# Patient Record
Sex: Male | Born: 1935 | Race: White | Hispanic: No | Marital: Married | Smoking: Former smoker
Health system: Southern US, Community
[De-identification: ages and names within clinical notes are randomized; demographics above are authoritative.]

## PROBLEM LIST (undated history)

## (undated) DIAGNOSIS — E785 Hyperlipidemia, unspecified: Secondary | ICD-10-CM

## (undated) DIAGNOSIS — I10 Essential (primary) hypertension: Secondary | ICD-10-CM

## (undated) DIAGNOSIS — J449 Chronic obstructive pulmonary disease, unspecified: Secondary | ICD-10-CM

## (undated) DIAGNOSIS — I739 Peripheral vascular disease, unspecified: Secondary | ICD-10-CM

## (undated) DIAGNOSIS — D649 Anemia, unspecified: Secondary | ICD-10-CM

## (undated) HISTORY — PX: PR VEIN BYPASS GRAFT,AORTO-FEM-POP: 35551

## (undated) HISTORY — DX: Peripheral vascular disease, unspecified: I73.9

## (undated) HISTORY — DX: Anemia, unspecified: D64.9

## (undated) HISTORY — DX: Hyperlipidemia, unspecified: E78.5

## (undated) HISTORY — DX: Essential (primary) hypertension: I10

## (undated) HISTORY — DX: Chronic obstructive pulmonary disease, unspecified: J44.9

---

## 1998-02-13 ENCOUNTER — Inpatient Hospital Stay: Admission: RE | Admit: 1998-02-13 | Discharge: 1998-02-18 | Payer: Self-pay | Admitting: Vascular Surgery

## 2006-12-23 ENCOUNTER — Ambulatory Visit: Payer: Self-pay | Admitting: Vascular Surgery

## 2007-07-10 ENCOUNTER — Ambulatory Visit: Payer: Self-pay | Admitting: Vascular Surgery

## 2008-07-05 ENCOUNTER — Ambulatory Visit: Payer: Self-pay | Admitting: Vascular Surgery

## 2008-07-16 ENCOUNTER — Ambulatory Visit: Payer: Self-pay | Admitting: Vascular Surgery

## 2008-07-23 ENCOUNTER — Ambulatory Visit: Payer: Self-pay | Admitting: Surgery

## 2008-07-23 ENCOUNTER — Ambulatory Visit (HOSPITAL_COMMUNITY): Admission: RE | Admit: 2008-07-23 | Discharge: 2008-07-23 | Payer: Self-pay | Admitting: Surgery

## 2008-07-29 ENCOUNTER — Ambulatory Visit: Payer: Self-pay | Admitting: Vascular Surgery

## 2008-08-27 ENCOUNTER — Ambulatory Visit: Payer: Self-pay | Admitting: Vascular Surgery

## 2008-10-29 ENCOUNTER — Ambulatory Visit: Payer: Self-pay | Admitting: Vascular Surgery

## 2009-01-28 ENCOUNTER — Ambulatory Visit: Payer: Self-pay | Admitting: Vascular Surgery

## 2009-06-24 ENCOUNTER — Ambulatory Visit: Payer: Self-pay | Admitting: Vascular Surgery

## 2009-12-16 ENCOUNTER — Ambulatory Visit: Payer: Self-pay | Admitting: Vascular Surgery

## 2010-07-22 ENCOUNTER — Ambulatory Visit: Payer: Self-pay | Admitting: Vascular Surgery

## 2011-01-05 NOTE — Procedures (Signed)
BYPASS GRAFT EVALUATION   INDICATION:  Follow up left lower extremity bypass graft.   HISTORY:  Diabetes:  Yes.  Cardiac:  No.  Hypertension:  Yes.  Smoking:  Quit.  Previous Surgery:  Left femoral-popliteal artery bypass graft,  11/22/1997 by Dr. Hart Rochester with PTA of the bypass graft on 07/23/2008 by  Dr. Myra Gianotti.   SINGLE LEVEL ARTERIAL EXAM                               RIGHT              LEFT  Brachial:                    167                165  Anterior tibial:             149                152  Posterior tibial:            165                150  Peroneal:  Ankle/brachial index:        0.99               0.91   PREVIOUS ABI:  Date: 06/24/2009  RIGHT:  0.88  LEFT:  0.73   LOWER EXTREMITY BYPASS GRAFT DUPLEX EXAM:   DUPLEX:  1. Doppler arterial waveforms appear biphasic proximal to, within, and      distal to the left lower extremity bypass graft.  No evidence of      graft stenosis.  2. Evidence of flow within the left superficial femoral artery      proximal to mid, then occluding distally with collaterals noted.  3. Multiple areas of elevated velocities within the SFA, including mid      thigh in general area of previously documented mid thigh      stenosis.  4. Elevated velocities of 508 cm/s in the distal tibioperoneal trunk      suggestive of >75% stenosis in the general area of the stenosis      documented 01/28/2009.   IMPRESSION:  1. Patent left femoral-popliteal artery bypass graft with no evidence      of graft stenosis.  2. Evidence of left distal tibioperoneal trunk stenosis >75% stenosis.  3. Right ankle brachial index shows slight increase.  Left ankle      brachial index shows significant increase from previous study.  4. Bilateral waveforms at ankle are all biphasic.   ___________________________________________  Quita Skye. Hart Rochester, M.D.   AS/MEDQ  D:  12/16/2009  T:  12/16/2009  Job:  [id number]

## 2011-01-05 NOTE — Procedures (Signed)
BYPASS GRAFT EVALUATION   INDICATION:  Follow-up left lower extremity bypass graft, lower  extremity pain has increased over the last month.   HISTORY:  Diabetes:  Yes.  Cardiac:  No.  Hypertension:  Yes.  Smoking:  No.  Previous Surgery:  Left femoral-popliteal artery bypass graft with  saphenous vein and left external iliac artery and common femoral artery  DPA on 11/22/1997.  Removal of occluded left femoral-popliteal artery  Gore-Tex bypass graft on 02/13/1998.   SINGLE LEVEL ARTERIAL EXAM                               RIGHT              LEFT  Brachial:                    150                165  Anterior tibial:             135                91  Posterior tibial:            148                106  Peroneal:  Ankle/brachial index:        0.90               0.64   PREVIOUS ABI:  Date: 07/10/2007  RIGHT:  0.97  LEFT:  0.91   LOWER EXTREMITY BYPASS GRAFT DUPLEX EXAM:   DUPLEX:  1. Doppler arterial waveforms appear biphasic proximal to the graft      dropping to low flow monophasic by distal graft.  2. Elevated velocities of 295 cm/s in the native distal to the graft      suggestive of >75% stenosis.   IMPRESSION:  1. Right ABI appears stable.  2. Left ABI shows significant decrease.  3. Left femoral-popliteal artery bypass graft appears patent with      elevated velocities in the native distal to it, suggestive of >75%      stenosis.  4. Spoke with Dr. Arbie Cookey and follow-up with Dr. Hart Morris was scheduled.   ___________________________________________  Trevor Morris, M.D.   AS/MEDQ  D:  07/05/2008  T:  07/05/2008  Job:  [id number]

## 2011-01-05 NOTE — Procedures (Signed)
BYPASS GRAFT EVALUATION   INDICATION:  Follow up of a left femoral-to-popliteal bypass graft.   HISTORY:  Diabetes:  No  Cardiac:  No.  Hypertension:  No.  Smoking:  Yes.  Previous Surgery:  Previous left femoral-to-popliteal bypass graft in  December, 2009.   SINGLE LEVEL ARTERIAL EXAM                               RIGHT              LEFT  Brachial:                    157                166  Anterior tibial:             136                117  Posterior tibial:            146                121  Peroneal:  Ankle/brachial index:        0.88               0.73   PREVIOUS ABI:  Date: 01/28/09  RIGHT:  0.78  LEFT:  0.58   LOWER EXTREMITY BYPASS GRAFT DUPLEX EXAM:   DUPLEX:  Triphasic Doppler waveforms proximal to and at proximal bypass  graft.  Monophasic waveforms in the mid thigh bypass graft and returns  to biphasic in the distal bypass graft and distal arteries.   IMPRESSION:  1. A 50-75% stenosis identified in the mid thigh bypass graft.  2. Right ankle brachial index suggests mild disease.  3. Left ankle brachial index suggests moderate disease.  4. Has shown improvement bilaterally compared with previous studies.   ___________________________________________  Quita Skye Hart Rochester, M.D.   CJ/MEDQ  D:  06/24/2009  T:  06/24/2009  Job:  [id number]

## 2011-01-05 NOTE — Op Note (Signed)
NAME:  ROCKNEY, GRENZ            ACCOUNT NO.:  [id number]   MEDICAL RECORD NO.:  [id number]          PATIENT TYPE:  AMB   LOCATION:  SDS                          FACILITY:  [facility]   PHYSICIAN:  VDurene Cal IV, MDDATE OF BIRTH:  May 12, 1934   DATE OF PROCEDURE:  07/23/2008  DATE OF DISCHARGE:                               OPERATIVE REPORT   PREOPERATIVE DIAGNOSIS:  Left femoral-popliteal bypass graft stenosis.   POSTOPERATIVE DIAGNOSIS:  Left femoral-popliteal bypass graft stenosis.   PROCEDURE PERFORMED:  1. Ultrasound-guided access, right common femoral artery.  2. Abdominal aortogram.  3. Left lower extremity runoff.  4. Third-order catheterization.  5. Additional-order catheterization.  6. Percutaneous transluminal angioplasty, left femoral-popliteal      bypass graft.  7. Percutaneous transluminal angioplasty, left tibioperoneal trunk.  8. Followup angiogram x2.  9. Retrograde right femoral angiogram.  10.Closure device.   INDICATIONS:  This is a 77 year old gentleman who has previously  undergone femoral-popliteal bypass graft with vein.  He has had a PTFE  graft removed.  He is asymptomatic but has had a decrease in his ankle-  brachial index 0.9-0.6, identified was a stenosis at the distal  anastomosis in the native distal artery.  He comes in today for  arteriogram.  Risks and benefits were discussed.  Informed consent was  signed.   PROCEDURE:  The patient was identified in the holding area and taken to  room 8 and he was placed supine on the table.  Bilateral groins were  prepped and draped in a standard sterile fashion.  A time-out was  called.  The right femoral artery was evaluated with ultrasound, found  to be calcified, but patent.  A 1% lidocaine was used for local  anesthesia.  The right femoral artery was accessed under ultrasound  guidance with an 18-gauge needle.  An 035 wire was advanced in  retrograde fashion into the abdominal aorta under  fluoroscopic  visualization.  A 5-French sheath was placed.  Over the wire, an Omni  flush catheter was placed at the level of L1 and abdominal aortogram was  obtained.  Next, using a Bentson wire and Omni flush catheter, a  bifurcation was crossed.  Catheter was placed in the left external iliac  artery and left lower extremity runoff was obtained.   FINDINGS:  Aortogram:  Visualized portions of the suprarenal abdominal  aorta show minimal disease.  The left renal artery is visualized and  found to be patent.  Right renal artery is not well visualized.  There  is moderate diffuse disease throughout the infrarenal abdominal aorta.  There is focal narrowing of the proximal right common iliac artery,  however, this does not appear to be hemodynamically significant.  The  right external iliac artery is widely patent and the right hypogastric  artery is widely patent.  There is luminal narrowing and a focal ulcer  at the aortic bifurcation.  The common iliac and external iliac artery  on the left are widely patent.  The left hypogastric artery is widely  patent.   Left lower extremity:  The left common femoral artery is patent.  The  left profunda femoral artery is patent.  The left superficial femoral  artery is occluded.  There is sluggish flow through the femoral-  popliteal bypass graft.  There is an area of high-grade stenosis in the  distal popliteal artery.  This is at the level of the anastomosis.  The  patient has 2-vessel runoff via the posterior tibial and peroneal  artery.   INTERVENTION:  At this point in time the decision was made to intervene.  Over a Rosen wire, the 5-French sheath was exchanged out for 45 cm 7-  [place] Terumo sheath.  The patient was then given systemic  heparinization.  A Cougar wire and a KMP catheter was used to select the  bypass graft with a Cougar wire.  The wire was then advanced down to the  distal anastomosis.  A 2 x 2 Fox SV balloon was then  advanced over the  wire down to the level of the luminal narrowing.  The Cougar wire was  then manipulated and advanced into the peroneal artery.  The 2 x 2 Fox  SV balloon was then placed across the area of stenosis at the distal  anastomosis and into the native tibioperoneal trunk.  It was taken to  nominal pressure which was 12 and held up for a minute.  Followup  arteriogram revealed suboptimal result with significant stenosis across  the distal anastomosis and native tibioperoneal artery.  I then selected  a 3 x 4 balloon and performed balloon angioplasty of the bypass graft in  the tibioperoneal trunk.  Balloon was taken to nominal pressure and held  up for 2 minutes.  Followup arteriogram revealed suboptimal result.  I  then selected a 4 mm x 40 Fox SV balloon.  This was taken to 6  atmospheres, held up to a nominal pressure.  Followup angiogram revealed  dissection at the distal anastomosis.  Therefore, the balloon was taken  to profile and held up for 3 minutes.  Multiple views were obtained.  I  did not feel the dissection was flow limiting.  There was an area of  approximately 30% stenosis in the native tibioperoneal trunk, which did  not respond to balloon angioplasty.  I did not feel that aggressively  dilating this area would be beneficial and therefore elected to  terminate the procedure at this point.  Catheters and wires were  removed.  Sheath was pulled back into the right external iliac artery.  At retrograde, right femoral angiogram was obtained to evaluate closure  device, found to be adequate.  StarClose was successfully deployed.   IMPRESSION:  1. Diffusely diseased infrarenal abdominal aorta.  2. High-grade stenosis at the distal left femoral-popliteal bypass      graft, successfully treated with 4-mm balloon.  Small residual      dissection that did not appear to be flow limiting.  There is also      an area of stenosis that did not respond to balloon  angioplasty,      however, overall flow through the bypass graft is significantly      improved.  Decision was made not to proceed with more aggressive      treatment.           ______________________________  V. Charlena Cross, MD  Electronically Signed     VWB/MEDQ  D:  07/23/2008  T:  07/24/2008  Job:  [id number]

## 2011-01-05 NOTE — Procedures (Signed)
BYPASS GRAFT EVALUATION   INDICATION:  Follow-up evaluation of lower extremity bypass graft.   HISTORY:  Diabetes:  No.  Cardiac:  No.  Hypertension:  Yes.  Smoking:  Quit in 1989.  Previous Surgery:  PTA of left fem-pop bypass graft on 07/23/08 by Dr.  Myra Gianotti.   SINGLE LEVEL ARTERIAL EXAM                               RIGHT              LEFT  Brachial:                    152                158  Anterior tibial:             123                88  Posterior tibial:            119                91  Peroneal:  Ankle/brachial index:        0.78               0.58   PREVIOUS ABI:  Date:  10/29/08  RIGHT:  0.81  LEFT:  0.77   LOWER EXTREMITY BYPASS GRAFT DUPLEX EXAM:   DUPLEX:  1. Increased velocity of 636 cm/s noted at the left distal fem-pop      bypass graft anastomosis.  2. Biphasic duplex waveforms noted within the graft and native artery.   IMPRESSION:  1. Patent femoropopliteal bypass graft with no evidence of focal      stenosis.  2. Right lower extremity ankle brachial index suggests moderate      arterial disease with biphasic Doppler waveform.  3. Left lower extremity ankle brachial index suggests moderate-to-      severe arterial disease with biphasic Doppler waveform.   ___________________________________________  Trevor Morris, M.D.   AC/MEDQ  D:  01/28/2009  T:  01/28/2009  Job:  [id number]

## 2011-01-05 NOTE — Assessment & Plan Note (Signed)
OFFICE VISIT   DONJUAN, ROBISON  DOB:  1933-11-08                                       08/27/2008  ZOXWR#:[id number]   Mr. Corales had primary angioplasty of the left femoral popliteal  bypass graft distally in the tibial peroneal trunk done by Dr. Myra Gianotti  on December 1st for failing left femoral popliteal graft.  His ABI has  improved from 64% to 90%.  The right leg is stable with an ABI of 0.92.  He has no claudication symptoms at the present time.   PHYSICAL EXAMINATION:  Blood pressure 197/81, heart rate 81,  respirations 14.  He has 3+ femoral popliteal pulses bilaterally with 2+  posterior tibial pulses palpable in both legs.   I discussed with him continuing his aspirin and treatment daily as well  as remaining active and continuing surveillance of the lower extremity  graft which is at high risk of developing recurrent stenosis in the  future.  He will return in 3 months for his next protocol visit or  sooner if he should have any change in his symptomatology.   Quita Skye Hart Rochester, M.D.  Electronically Signed   JDL/MEDQ  D:  08/27/2008  T:  08/28/2008  Job:  [id number]

## 2011-01-05 NOTE — Consult Note (Signed)
NEW PATIENT CONSULTATION   Trevor Morris, Trevor Morris  DOB:  07/08/1934                                       07/16/2008  ZOXWR#:[id number]   The patient is an old patient of mine having undergone a left femoral-  popliteal bypass grafting in 1999 with vein graft and eventually had a  nonfunctional femoral popliteal Gore-Tex graft removed from the left leg  because it was infected.  He has done well over the last 10 years,  widely patent vein graft of the left leg and no symptoms of  claudication.  He has been quite active and has no history of coronary  artery disease, chest pain, dyspnea on exertion, PND or orthopnea.  He  also denies any TIAs, amaurosis fugax, diplopia, blurred vision or  syncope.  Lately he has been able to walk up to a mile without severe  symptoms but there has been some abnormality noted on his duplex scan of  his left femoral popliteal graft with a narrowing distal to the vein  graft and a decrease in the ABI from 0.91 to 0.64.  He takes one aspirin  a day.   PHYSICAL EXAM:  Blood pressure 154/77, heart rate 73, respirations 16.  His carotid pulses are 3+ with no audible bruits.  Chest:  Clear to  auscultation.  Cardiovascular:  Regular rhythm with no murmurs.  Abdomen:  Soft, nontender with no masses.  The right leg has a 2+  femoral, 3+ popliteal and 3+ posterior tibial pulse.  Left leg has a 3+  femoral, 3+ popliteal and no distal pulses.  Both feet are adequately  perfused.   ABI in the left leg was 0.64 and in the right leg 0.90.   He does have evidence of a severe stenosis just distal to the vein graft  either in the popliteal or tibial peroneal trunk on the left.  We will  schedule him for angiography by Dr. Myra Gianotti on December 1 via the right  common femoral approach and see what the findings are.  He may need a  surgical revision of this artery or if quite focal possibly an  angioplasty or stenting depending on the location.  I  discussed this  with the patient and he is agreeable.   Quita Skye Hart Rochester, M.D.  Electronically Signed   JDL/MEDQ  D:  07/16/2008  T:  07/17/2008  Job:  [id number]

## 2011-01-05 NOTE — Assessment & Plan Note (Signed)
OFFICE VISIT   CASYN, BECVAR  DOB:  07/05/1934                                       01/28/2009  ZOXWR#:[id number]   The patient had a left femoral popliteal vein graft in 1999 using vein  from the right leg.  He had had three failed vein grafts by Dr. Sheron Nightingale performed in 1989 using vein from the left leg.  Dr. Myra Gianotti  performed an angiogram and an intervention of the left tibial peroneal  trunk where there was a severe stenosis just distal to the functioning  vein graft which really looked fairly good.  There was two vessel runoff  of the posterior tibial and peroneal artery.  This was done in December  of 2009.  ABI improved to about 90% and has now decreased in the last  two visits to currently 77%.  Scan of the graft today reveals the graft  to be widely patent but an area of high velocity just distal to the vein  graft 635 cm/sec.   On examination he has a 3+ femoral and popliteal graft pulse as well as  a 2+ posterior tibial pulse in the left ankle with a well-perfused left  foot.  He says he is having no claudication symptoms in the left leg.  The right leg continues to have an ABI of 0.78 with triphasic flow in  the right foot.   I am concerned obviously that the area of angioplasty in the tibial  peroneal trunk is restenosing although he does have an excellent pulse  in the left foot and no symptoms.  We will repeat his ABI in 3 months as  well as scan of the graft.  If it continues to be significantly abnormal  will then repeat the angiogram.  If he needs an extension of the vein  graft to a more distal site it appears the peroneal may be the best  vessel and a short piece of cephalic vein graft from the right arm  should allow [place] to do that.   Quita Skye Hart Rochester, M.D.  Electronically Signed   JDL/MEDQ  D:  01/28/2009  T:  01/29/2009  Job:  [id number]

## 2011-01-05 NOTE — Procedures (Signed)
BYPASS GRAFT EVALUATION   INDICATION:  Follow up PTA of left fem-pop bypass graft.   HISTORY:  Diabetes:  No.  Cardiac:  No.  Hypertension:  Yes.  Smoking:  No.  Previous Surgery:  Please see above.   SINGLE LEVEL ARTERIAL EXAM                               RIGHT              LEFT  Brachial:                    167                162  Anterior tibial:             136                129  Posterior tibial:            136                126  Peroneal:  Ankle/brachial index:        0.81               0.77   PREVIOUS ABI:  Date: 08/27/08  RIGHT:  0.92  LEFT:  0.90   LOWER EXTREMITY BYPASS GRAFT DUPLEX EXAM:   DUPLEX:  Patent left fem-pop bypass graft with no evidence of focal  stenosis.   IMPRESSION:  1. Patent left femoropopliteal bypass graft with no evidence of focal      stenosis.  2. Mildly abnormal ankle brachial index with biphasic Doppler waveform      noted in bilateral legs, status post percutaneous transluminal      angioplasty of left femoropopliteal bypass graft.   ___________________________________________  Quita Skye. Hart Rochester, M.D.   MG/MEDQ  D:  10/29/2008  T:  10/29/2008  Job:  [id number]

## 2011-01-05 NOTE — Assessment & Plan Note (Signed)
OFFICE VISIT   MONTAE, STAGER  DOB:  12/08/1933                                       07/29/2008  WUJWJ#:[id number]   The patient underwent angioplasty of his left distal popliteal and  tibioperoneal trunk distal to the left femoral popliteal bypass graft.  This was performed by Dr. Myra Gianotti 6 days ago.  He developed an allergic  rash-type reaction to the contrast material, which began a day or 2  later.  This involved his chest, abdomen, both inguinal areas, both  thighs, and the calf areas bilaterally.  He initially tried Benadryl and  later was started on a steroid dose pack, which is about complete.  He  has also been trying aloe vera lotion.  I suggested that he try some  Cortaid ointment as well, and to continue p.r.n. Benadryl as well as the  steroids.   EXAM:  Blood pressure is 155/71, heart rate is 84, respirations 14,  temperature 98.  Chest is free of any urticarial rash today.  Abdomen is  soft and nontender with no masses.  Right leg has a 2+ femoral pulse,  palpable popliteal, and 1 to 2+ posterior tibial pulse.  The left leg  has 3+ femoral, popliteal, and posterior tibial pulses, well-perfused  left leg.  Most of the remaining rash is in the lower abdomen, inguinal  area, and proximal thighs.  Appears to be resolving.   He was reassured regarding these findings.  Will return in 1 month for  his followup appointment, at which time ABI will be obtained, unless he  has problems in the interim.   Quita Skye Hart Rochester, M.D.  Electronically Signed   JDL/MEDQ  D:  07/29/2008  T:  07/30/2008  Job:  [id number]

## 2011-01-05 NOTE — Procedures (Signed)
BYPASS GRAFT EVALUATION   INDICATION:  Followup lower extremity bypass graft.   HISTORY:  Diabetes:  Yes.  Cardiac:  No.  Hypertension:  Yes.  Smoking:  No.  Previous Surgery:  Left fem-pop bypass graft with saphenous vein and  left EIA and CFA DPA on 11/22/1997.  Removal of occluded left fem-pop  Gore-Tex graft on 02/13/1998.   SINGLE LEVEL ARTERIAL EXAM                               RIGHT              LEFT  Brachial:                    136                136  Anterior tibial:             125                120  Posterior tibial:            132                124  Peroneal:  Ankle/brachial index:        0.97               0.91   PREVIOUS ABI:  Date: 12/23/2006  RIGHT:  0.91  LEFT:  0.92   LOWER EXTREMITY BYPASS GRAFT DUPLEX EXAM:  See attached sheet for  velocities.   DUPLEX:  Doppler arterial waveforms appear borderline,  biphasic/triphasic, proximal to, within, and distal to left femoral-  popliteal bypass graft.   IMPRESSION:  1. ABIs are stable from previous study bilaterally.  2. Patent left femoral-popliteal bypass graft.   ___________________________________________  Trevor Morris. Hart Rochester, M.D.   AS/MEDQ  D:  07/10/2007  T:  07/11/2007  Job:  [id number]

## 2011-01-05 NOTE — Procedures (Signed)
BYPASS GRAFT EVALUATION   INDICATION:  Follow up left fem-pop graft.   HISTORY:  Diabetes:  Yes.  Cardiac:  No.  Hypertension:  Yes.  Smoking:  Quit.  Previous Surgery:  Left fem-pop graft 11/22/1997, performed by Dr.  Hart Rochester; PTAF graft 07/23/2008 performed by Dr. Myra Gianotti.   SINGLE LEVEL ARTERIAL EXAM                               RIGHT              LEFT  Brachial:                    193                192  Anterior tibial:             153                148  Posterior tibial:            173                152  Peroneal:  Ankle/brachial index:        0.90               0.79   PREVIOUS ABI:  Date: 12/16/2009  RIGHT:  0.99  LEFT:  0.91   LOWER EXTREMITY BYPASS GRAFT DUPLEX EXAM:   DUPLEX:  1. Patent graft with elevated velocities near the area of the distal      anastomosis/PT trunk.  2. The graft has been tunneled deep to the native vessels.  3. Interrogation of the popliteal area is difficult due to multiple      interventions.  4. Biphasic and triphasic waveforms throughout the graft.   IMPRESSION:  1. Patent left femoral-popliteal graft with elevated velocities near      the distal anastomosis/__________trunk area, which have been      previously documented.  2. Mild right ankle brachial index, moderate left ankle brachial      index.  3. No significant changes from previous study of 12/16/2009.   ___________________________________________  Quita Skye. Hart Rochester, M.D.   LT/MEDQ  D:  07/22/2010  T:  07/22/2010  Job:  [id number]

## 2011-01-05 NOTE — Assessment & Plan Note (Signed)
OFFICE VISIT   Trevor Morris, Trevor Morris  DOB:  1933/11/24                                       06/24/2009  ZOXWR#:[id number]   The patient returns for further followup regarding his lower extremity  left femoral-popliteal bypass graft.  He reports no claudication  symptoms in either leg and says that he is doing well.  He denies any  chest pain, dyspnea on exertion, PND or orthopnea.   I ordered a duplex scan of his left femoral-popliteal graft today as  well as ABIs and I have reviewed and interpreted these.   PHYSICAL EXAMINATION:  Vital signs:  On examination today his blood  pressure is 194/74, heart rate 74, respirations 18.  Neck:  Carotid  pulses are 3+, no audible bruits.  Chest:  Clear to auscultation.  Cardiovascular:  Regular rhythm.  No murmurs.  Left leg reveals 3+  femoral, popliteal and 2+ posterior tibial pulses palpable.  Right leg  has a 3+ femoral, 2+ popliteal and 3+ posterior tibial pulse palpable.  ABI is 0.88 on the right and 0.73 on the left.   There is no evidence of the severe stenosis in the tibial peroneal trunk  thought to be present at the last visit 3 months ago with a high  velocity (636) in that area.  Today the velocity is normal.  There is  some mild elevation in the mid portion of the femoral-popliteal graft  but no severe stenosis.  ABI is also improved.   We will continue to follow him on the femoral-popliteal protocol on a  regular basis unless he develops any new symptoms.   Quita Skye Hart Rochester, M.D.  Electronically Signed   JDL/MEDQ  D:  06/24/2009  T:  06/25/2009  Job:  [id number]

## 2011-05-28 LAB — POCT I-STAT, CHEM 8
BUN: 24 mg/dL — ABNORMAL HIGH (ref 6–23)
Calcium, Ion: 1.21 mmol/L (ref 1.12–1.32)
Chloride: 103 mEq/L (ref 96–112)
Creatinine, Ser: 1.1 mg/dL (ref 0.4–1.5)
Glucose, Bld: 271 mg/dL — ABNORMAL HIGH (ref 70–99)
TCO2: 23 mmol/L (ref 0–100)

## 2011-05-28 LAB — GLUCOSE, CAPILLARY: Glucose-Capillary: 79 mg/dL (ref 70–99)

## 2011-06-04 ENCOUNTER — Encounter: Payer: Self-pay | Admitting: Vascular Surgery

## 2011-07-23 ENCOUNTER — Ambulatory Visit: Payer: Self-pay

## 2011-09-10 ENCOUNTER — Ambulatory Visit: Payer: Self-pay

## 2011-09-10 ENCOUNTER — Other Ambulatory Visit: Payer: Self-pay

## 2011-09-14 ENCOUNTER — Encounter: Payer: Self-pay | Admitting: Thoracic Diseases

## 2011-09-15 ENCOUNTER — Other Ambulatory Visit (INDEPENDENT_AMBULATORY_CARE_PROVIDER_SITE_OTHER): Payer: Medicare Other | Admitting: *Deleted

## 2011-09-15 ENCOUNTER — Ambulatory Visit (INDEPENDENT_AMBULATORY_CARE_PROVIDER_SITE_OTHER): Payer: Medicare Other | Admitting: *Deleted

## 2011-09-15 ENCOUNTER — Ambulatory Visit (INDEPENDENT_AMBULATORY_CARE_PROVIDER_SITE_OTHER): Payer: Medicare Other | Admitting: Thoracic Diseases

## 2011-09-15 VITALS — BP 146/63 | HR 80

## 2011-09-15 DIAGNOSIS — Z48812 Encounter for surgical aftercare following surgery on the circulatory system: Secondary | ICD-10-CM

## 2011-09-15 DIAGNOSIS — I739 Peripheral vascular disease, unspecified: Secondary | ICD-10-CM

## 2011-09-15 DIAGNOSIS — I70229 Atherosclerosis of native arteries of extremities with rest pain, unspecified extremity: Secondary | ICD-10-CM

## 2011-09-15 NOTE — Progress Notes (Signed)
[facility] [place]  Left Fem -Pop bypass evaluation Surgeon: Vivi Martens, MD  History of Present Illness  Trevor Morris is a 78 y.o. male patient who is here for ABI's. Pt. denies claudication Pt. denies rest pain; denies night pain denies non healing ulcers on BLE .  Pt has had previous intervention of Left Vascular Bypass .  Past Medical History  Diagnosis Date  . Diabetes mellitus   . Hyperlipidemia   . Hypertension   . Anemia     Social History History  Substance Use Topics  . Smoking status: Former Smoker    Types: Cigarettes    Quit date: 08/24/1987  . Smokeless tobacco: Not on file  . Alcohol Use: No    Family History Family History  Problem Relation Age of Onset  . Diabetes Other     ROS: [x]  Positive   [ ]  Negative   [ ]  All sytems reviewed and are negative  General:[ ]  Weight loss,  [ ]  Weight gain, [ ]  Fever, [ ]  chills Neurologic: [ ]  Dizziness, [ ]  Blackouts, [ ]  Seizure [ ]  Stroke, [ ]  "Mini stroke", [ ]  Slurred speech, [ ]  Temporary blindness;  [ ] weakness, [ ]  Hoarseness Cardiac: [ ]  Chest pain/pressure, [ ]  Shortness of breath at rest [ ]  Shortness of breath with exertion,  [ ]   Atrial fibrillation or irregular heartbeat Vascular:[ ]  Pain in legs with walking, [ ]  Pain in legs at rest ,[ ]  Pain in legs at night,  [ ]   Non-healing ulcer, [ ]  Blood clot in vein/DVT,   Pulmonary: [ ]  Home oxygen, [ ]   Productive cough, [ ]  Coughing up blood,  [ ]  Asthma,  [ ]  Wheezing Musculoskeletal:  [ ]  Arthritis, [ ]  Low back pain,  [ ]  Joint pain Hematologic:[ ]  Easy Bruising, [ ]  Anemia; [ ]  Hepatitis Gastrointestinal: [ ]  Blood in stool,  [ ]  Gastroesophageal Reflux, [ ]  Trouble swallowing Urinary: [ ]  chronic Kidney disease, [ ]  on HD, [ ]  Burning with urination, [ ]  Frequent urination, [ ]  Difficulty urinating;  Skin: [ ]  Rashes, [ ]  Wounds    Allergies  Allergen Reactions  . Omnipaque (Iohexol) Itching and Rash    Current  outpatient prescriptions:aspirin EC 81 MG tablet, Take 81 mg by mouth daily.  , Disp: , Rfl: ;  lisinopril (PRINIVIL,ZESTRIL) 40 MG tablet, Take 40 mg by mouth daily.  , Disp: , Rfl: ;  metFORMIN (GLUCOPHAGE) 500 MG tablet, Take 500 mg by mouth 2 (two) times daily with a meal.  , Disp: , Rfl: ;  rosuvastatin (CRESTOR) 40 MG tablet, Take 40 mg by mouth daily.  , Disp: , Rfl:   Physical Examination  Filed Vitals:   09/15/11 1456  BP: 146/63  Pulse: 80    There is no height or weight on file to calculate BMI.  General: A&O x 3, WDWN,  Gait: normal Eyes: PERRLA, Pulmonary: CTAB, without wheezes , rales or rhonchi Cardiac: regular Rythm  With occasional ectopic beat       Neurologic: A&O X 3; Appropriate Affect ; SENSATION ;normal; Musculoskeletal: normal 5/5 strength in all tested muscle groups  Skin of lower extremity Normal VASCULAR EXAM: Extremities without ischemic changes   BLE  LOWER EXTREMITY PULSES                                                                                                                               Right           Left      POSTERIOR TIBIAL palpable palpable       DORSALIS PEDIS      ANTERIOR TIBIAL absent absent    Non-Invasive Vascular Imaging: DATE: 09/15/2011 ABI: RIGHT 1.0;  LEFT 0.90 DUPLEX SCAN OF BYPASS: biphasic waveforms noted throughout LLE BPG with elevated velocity of > 300cm/s noted in L PT trunk, distal to the BPG  Previous angiogram: and PTA of left fem-pop bypass graft and left TP trunk 07/23/2008.    ASSESSMENT: Trevor Morris is a 78 y.o. male who presents with: left lower patent Fem-pop bypass and stable ABI's PLAN: Based on the patient's vascular studies and examination, pt will return to clinic in 1 years   Clinic MD: CS Edilia Bo, MD

## 2011-09-22 NOTE — Procedures (Unsigned)
BYPASS GRAFT EVALUATION  INDICATION:  Left lower extremity bypass graft.  HISTORY: Diabetes:  Yes. Cardiac:  No. Hypertension:  Yes. Smoking:  previous. Previous Surgery:  Left fem-pop bypass graft in 1999 with PTA of the bypass graft on 07/23/2008.  SINGLE LEVEL ARTERIAL EXAM                              RIGHT              LEFT Brachial: Anterior tibial: Posterior tibial: Peroneal: Ankle/brachial index:  PREVIOUS ABI:  Date:  RIGHT:  LEFT:  LOWER EXTREMITY BYPASS GRAFT DUPLEX EXAM:  DUPLEX:  Biphasic Doppler waveforms noted throughout the left lower extremity bypass graft with velocities of 232 cm/s and 302 cm/s noted in the distal anastomosis region and peroneal tibial trunk, respectively.  IMPRESSION: 1. Patent left femoropopliteal bypass graft with elevated velocities,     as described above.  No significant change in maximum velocity when     compared to the previous examination on 07/22/2010. 2. Bilateral ankle brachial indices are noted on a separate report.  ___________________________________________ Quita Skye. Hart Rochester, M.D.  CH/MEDQ  D:  09/16/2011  T:  09/16/2011  Job:  [id number]

## 2011-11-03 ENCOUNTER — Other Ambulatory Visit: Payer: Self-pay | Admitting: *Deleted

## 2011-11-03 DIAGNOSIS — I70229 Atherosclerosis of native arteries of extremities with rest pain, unspecified extremity: Secondary | ICD-10-CM

## 2011-11-03 DIAGNOSIS — Z48812 Encounter for surgical aftercare following surgery on the circulatory system: Secondary | ICD-10-CM

## 2012-09-25 ENCOUNTER — Ambulatory Visit: Payer: Medicare Other | Admitting: Neurosurgery

## 2012-10-03 ENCOUNTER — Ambulatory Visit: Payer: Medicare Other | Admitting: Vascular Surgery

## 2012-11-02 ENCOUNTER — Other Ambulatory Visit: Payer: Self-pay | Admitting: *Deleted

## 2012-11-02 DIAGNOSIS — Z48812 Encounter for surgical aftercare following surgery on the circulatory system: Secondary | ICD-10-CM

## 2012-11-02 DIAGNOSIS — I739 Peripheral vascular disease, unspecified: Secondary | ICD-10-CM

## 2012-11-13 ENCOUNTER — Encounter: Payer: Self-pay | Admitting: Vascular Surgery

## 2012-11-14 ENCOUNTER — Encounter (INDEPENDENT_AMBULATORY_CARE_PROVIDER_SITE_OTHER): Payer: Medicare Other | Admitting: *Deleted

## 2012-11-14 ENCOUNTER — Ambulatory Visit (INDEPENDENT_AMBULATORY_CARE_PROVIDER_SITE_OTHER): Payer: Medicare Other | Admitting: Vascular Surgery

## 2012-11-14 ENCOUNTER — Encounter: Payer: Self-pay | Admitting: Vascular Surgery

## 2012-11-14 VITALS — BP 206/111 | HR 89 | Resp 18 | Ht 64.0 in | Wt 140.0 lb

## 2012-11-14 DIAGNOSIS — Z48812 Encounter for surgical aftercare following surgery on the circulatory system: Secondary | ICD-10-CM

## 2012-11-14 DIAGNOSIS — I70229 Atherosclerosis of native arteries of extremities with rest pain, unspecified extremity: Secondary | ICD-10-CM

## 2012-11-14 DIAGNOSIS — I739 Peripheral vascular disease, unspecified: Secondary | ICD-10-CM

## 2012-11-14 HISTORY — DX: Peripheral vascular disease, unspecified: I73.9

## 2012-11-14 NOTE — Progress Notes (Signed)
Subjective:     Patient ID: Trevor Morris, male   DOB: [birth year], 79 y.o.   MRN: [id number]  HPI this 79 year old male returns for continued followup regarding his left femoral-popliteal bypass graft which is a saphenous vein graft from the right leg and was performed in 1999. He previously had some Gore-Tex graft placed in the left leg which were removed. He denies any claudication symptoms at this time. He spends the majority of his day caring for his wife who has had several strokes and requires 24-hour a day attention.  Past Medical History  Diagnosis Date  . Diabetes mellitus   . Hyperlipidemia   . Hypertension   . Anemia     History  Substance Use Topics  . Smoking status: Former Smoker    Types: Cigarettes    Quit date: 08/24/1987  . Smokeless tobacco: Never Used  . Alcohol Use: No    Family History  Problem Relation Age of Onset  . Diabetes Other   . Diabetes Father     Allergies  Allergen Reactions  . Omnipaque (Iohexol) Itching and Rash    Current outpatient prescriptions:aspirin EC 81 MG tablet, Take 81 mg by mouth daily.  , Disp: , Rfl: ;  lisinopril (PRINIVIL,ZESTRIL) 40 MG tablet, Take 40 mg by mouth daily.  , Disp: , Rfl: ;  metFORMIN (GLUCOPHAGE) 500 MG tablet, Take 500 mg by mouth 2 (two) times daily with a meal.  , Disp: , Rfl: ;  rosuvastatin (CRESTOR) 40 MG tablet, Take 40 mg by mouth daily.  , Disp: , Rfl:   BP 206/111  Pulse 89  Resp 18  Ht 5\' 4"  (1.626 m)  Wt 140 lb (63.504 kg)  BMI 24.02 kg/m2  Body mass index is 24.02 kg/(m^2).           Review of Systems Denies chest pain, dyspnea on exertion, PND, orthopnea, hemoptysis, lateralizing weakness. Did have sudden loss of hearing in his left ear several months ago the      Objective:   Physical Exam blood pressure 206/111 heart rate 89 respirations 18 Gen.-alert and oriented x3 in no apparent distress HEENT normal for age Lungs no rhonchi or wheezing Cardiovascular regular rhythm  no murmurs carotid pulses 3+ palpable no bruits audible Abdomen soft nontender no palpable masses Musculoskeletal free of  major deformities Skin clear -no rashes Neurologic normal Lower extremities 3+ femoral and Posterior tibial pulse on the left 2+ femoral and posterior tibial pulse on the right Both feet well perfused  Today I ordered a duplex scan of the left leg and bilateral ABIs. ABIs are 0.9 on the right and 0.88 on the left essentially unchanged from previous study 79 years ago. No significant stenosis the left femoral-popliteal graft is identified on duplex scanning.        Assessment:     Nicely functioning left femoral-popliteal saphenous vein graft placed in 1999 with no significant claudication     Plan:     Return in one year for duplex scan of bypass and bilateral ABIs unless symptoms change in the interim

## 2012-11-14 NOTE — Addendum Note (Signed)
Addended by: Dannielle Karvonen on: 11/14/2012 01:58 PM   Modules accepted: Orders

## 2013-11-19 ENCOUNTER — Encounter: Payer: Self-pay | Admitting: Vascular Surgery

## 2013-11-20 ENCOUNTER — Ambulatory Visit (HOSPITAL_COMMUNITY)
Admission: RE | Admit: 2013-11-20 | Discharge: 2013-11-20 | Disposition: A | Discharge status: Home / Self Care | Payer: Medicare Other | Source: Ambulatory Visit | Attending: Vascular Surgery | Admitting: Vascular Surgery

## 2013-11-20 ENCOUNTER — Encounter: Payer: Self-pay | Admitting: Vascular Surgery

## 2013-11-20 ENCOUNTER — Ambulatory Visit (INDEPENDENT_AMBULATORY_CARE_PROVIDER_SITE_OTHER): Payer: Medicare Other | Admitting: Vascular Surgery

## 2013-11-20 VITALS — BP 171/75 | HR 79 | Ht 64.0 in | Wt 139.0 lb

## 2013-11-20 DIAGNOSIS — Z48812 Encounter for surgical aftercare following surgery on the circulatory system: Secondary | ICD-10-CM | POA: Insufficient documentation

## 2013-11-20 DIAGNOSIS — I739 Peripheral vascular disease, unspecified: Secondary | ICD-10-CM

## 2013-11-20 NOTE — Progress Notes (Signed)
Subjective:     Patient ID: Trevor Morris, male   DOB: 07-29-1934, 80 y.o.   MRN: [id number]  HPI this 80 year old male returns for continued followup regarding his left femoral popliteal saphenous vein graft using vein from the right leg which I performed in 1999. He had angioplasty of the popliteal artery and tibioperoneal trunk t in 2009 by Dr. Myra Gianotti . Patient does not ambulate long distances. He spends most of his day caring for his wife who has had multiple strokes. He denies any rest pain or history of nonhealing ulcers or infection.  Past Medical History  Diagnosis Date  . Diabetes mellitus   . Hyperlipidemia   . Hypertension   . Anemia     History  Substance Use Topics  . Smoking status: Former Smoker    Types: Cigarettes    Quit date: 08/24/1987  . Smokeless tobacco: Never Used  . Alcohol Use: No    Family History  Problem Relation Age of Onset  . Diabetes Other   . Diabetes Father     Allergies  Allergen Reactions  . Omnipaque [Iohexol] Itching and Rash    Current outpatient prescriptions:aspirin EC 81 MG tablet, Take 81 mg by mouth daily.  , Disp: , Rfl: ;  lisinopril (PRINIVIL,ZESTRIL) 40 MG tablet, Take 40 mg by mouth daily.  , Disp: , Rfl: ;  metFORMIN (GLUCOPHAGE) 500 MG tablet, Take 500 mg by mouth 2 (two) times daily with a meal.  , Disp: , Rfl: ;  rosuvastatin (CRESTOR) 40 MG tablet, Take 40 mg by mouth daily.  , Disp: , Rfl:   BP 171/75  Pulse 79  Ht 5\' 4"  (1.626 m)  Wt 139 lb (63.05 kg)  BMI 23.85 kg/m2  SpO2 96%  Body mass index is 23.85 kg/(m^2).           Review of Systems denies chest pain, dyspnea on exertion, PND, orthopnea, hemoptysis, claudication. All systems negative complete review of systems     Objective:   Physical Exam BP 171/75  Pulse 79  Ht 5\' 4"  (1.626 m)  Wt 139 lb (63.05 kg)  BMI 23.85 kg/m2  SpO2 96%  Gen.-alert and oriented x3 in no apparent distress HEENT normal for age Lungs no rhonchi or  wheezing Cardiovascular regular rhythm no murmurs carotid pulses 3+ palpable no bruits audible Abdomen soft nontender no palpable masses Musculoskeletal free of  major deformities Skin clear -no rashes Neurologic normal Lower extremities 3+ femoral and popliteal pulse bilaterally. Distal pulses are difficult to palpate. The feet are well-perfused with no evidence of infection.  Today I ordered a duplex scan of the left femoral-popliteal graft and bilateral ABIs which are reviewed and interpreted. ABI on the right is 0.83 on the left is 0.75 down from 0.88 most recently in March of 2014. There is evidence of some increased velocity in the left femoral-popliteal graft at the distal anastomosis to the popliteal artery at 323 cm/s.        Assessment:     Nicely functioning left femoral-popliteal vein graft with possible stenosis at distal anastomosis or below knee popliteal artery on the left    Plan:     Return in 6 months with repeat ABIs in duplex scan left femoral-popliteal graft to look at the vein graft and popliteal artery for possible progressive stenosis. The patient should develop acute symptoms he will be in touch with [place]

## 2013-11-20 NOTE — Addendum Note (Signed)
Addended by: Sharee Pimple on: 11/20/2013 12:39 PM   Modules accepted: Orders

## 2014-03-21 ENCOUNTER — Inpatient Hospital Stay (HOSPITAL_COMMUNITY): Payer: Medicare Other

## 2014-03-21 ENCOUNTER — Encounter (HOSPITAL_COMMUNITY): Payer: Self-pay | Admitting: *Deleted

## 2014-03-21 ENCOUNTER — Encounter: Payer: Self-pay | Admitting: Vascular Surgery

## 2014-03-21 ENCOUNTER — Inpatient Hospital Stay (HOSPITAL_COMMUNITY)
Admission: AD | Admit: 2014-03-21 | Discharge: 2014-04-05 | DRG: 280 | Disposition: A | Discharge status: Hospice | Payer: Medicare Other | Source: Ambulatory Visit | Attending: Internal Medicine | Admitting: Internal Medicine

## 2014-03-21 ENCOUNTER — Ambulatory Visit (INDEPENDENT_AMBULATORY_CARE_PROVIDER_SITE_OTHER): Payer: Medicare Other | Admitting: Vascular Surgery

## 2014-03-21 ENCOUNTER — Ambulatory Visit (INDEPENDENT_AMBULATORY_CARE_PROVIDER_SITE_OTHER)
Admission: RE | Admit: 2014-03-21 | Discharge: 2014-03-21 | Disposition: A | Discharge status: Home / Self Care | Payer: Medicare Other | Source: Ambulatory Visit | Attending: Vascular Surgery | Admitting: Vascular Surgery

## 2014-03-21 ENCOUNTER — Telehealth: Payer: Self-pay

## 2014-03-21 VITALS — BP 154/66 | HR 88 | Ht 64.0 in | Wt 131.3 lb

## 2014-03-21 DIAGNOSIS — E785 Hyperlipidemia, unspecified: Secondary | ICD-10-CM | POA: Diagnosis present

## 2014-03-21 DIAGNOSIS — K209 Esophagitis, unspecified without bleeding: Secondary | ICD-10-CM | POA: Diagnosis present

## 2014-03-21 DIAGNOSIS — D638 Anemia in other chronic diseases classified elsewhere: Secondary | ICD-10-CM | POA: Diagnosis present

## 2014-03-21 DIAGNOSIS — J811 Chronic pulmonary edema: Secondary | ICD-10-CM | POA: Diagnosis present

## 2014-03-21 DIAGNOSIS — R1310 Dysphagia, unspecified: Secondary | ICD-10-CM | POA: Diagnosis present

## 2014-03-21 DIAGNOSIS — I509 Heart failure, unspecified: Secondary | ICD-10-CM | POA: Diagnosis not present

## 2014-03-21 DIAGNOSIS — Z7982 Long term (current) use of aspirin: Secondary | ICD-10-CM

## 2014-03-21 DIAGNOSIS — Z888 Allergy status to other drugs, medicaments and biological substances status: Secondary | ICD-10-CM | POA: Diagnosis not present

## 2014-03-21 DIAGNOSIS — R0902 Hypoxemia: Secondary | ICD-10-CM | POA: Diagnosis not present

## 2014-03-21 DIAGNOSIS — D649 Anemia, unspecified: Secondary | ICD-10-CM | POA: Diagnosis present

## 2014-03-21 DIAGNOSIS — I739 Peripheral vascular disease, unspecified: Secondary | ICD-10-CM | POA: Diagnosis present

## 2014-03-21 DIAGNOSIS — Z515 Encounter for palliative care: Secondary | ICD-10-CM | POA: Diagnosis not present

## 2014-03-21 DIAGNOSIS — J449 Chronic obstructive pulmonary disease, unspecified: Secondary | ICD-10-CM | POA: Diagnosis present

## 2014-03-21 DIAGNOSIS — J8410 Pulmonary fibrosis, unspecified: Secondary | ICD-10-CM | POA: Diagnosis present

## 2014-03-21 DIAGNOSIS — E1169 Type 2 diabetes mellitus with other specified complication: Secondary | ICD-10-CM | POA: Diagnosis present

## 2014-03-21 DIAGNOSIS — F411 Generalized anxiety disorder: Secondary | ICD-10-CM | POA: Diagnosis not present

## 2014-03-21 DIAGNOSIS — I2510 Atherosclerotic heart disease of native coronary artery without angina pectoris: Secondary | ICD-10-CM | POA: Diagnosis present

## 2014-03-21 DIAGNOSIS — Z66 Do not resuscitate: Secondary | ICD-10-CM | POA: Diagnosis present

## 2014-03-21 DIAGNOSIS — J849 Interstitial pulmonary disease, unspecified: Secondary | ICD-10-CM

## 2014-03-21 DIAGNOSIS — Z91041 Radiographic dye allergy status: Secondary | ICD-10-CM | POA: Diagnosis not present

## 2014-03-21 DIAGNOSIS — A048 Other specified bacterial intestinal infections: Secondary | ICD-10-CM | POA: Diagnosis present

## 2014-03-21 DIAGNOSIS — K5900 Constipation, unspecified: Secondary | ICD-10-CM | POA: Diagnosis not present

## 2014-03-21 DIAGNOSIS — Z87891 Personal history of nicotine dependence: Secondary | ICD-10-CM | POA: Diagnosis not present

## 2014-03-21 DIAGNOSIS — Z79899 Other long term (current) drug therapy: Secondary | ICD-10-CM | POA: Diagnosis not present

## 2014-03-21 DIAGNOSIS — J9600 Acute respiratory failure, unspecified whether with hypoxia or hypercapnia: Secondary | ICD-10-CM | POA: Diagnosis not present

## 2014-03-21 DIAGNOSIS — E876 Hypokalemia: Secondary | ICD-10-CM | POA: Diagnosis not present

## 2014-03-21 DIAGNOSIS — I5041 Acute combined systolic (congestive) and diastolic (congestive) heart failure: Secondary | ICD-10-CM | POA: Diagnosis not present

## 2014-03-21 DIAGNOSIS — R404 Transient alteration of awareness: Secondary | ICD-10-CM | POA: Diagnosis not present

## 2014-03-21 DIAGNOSIS — M79609 Pain in unspecified limb: Secondary | ICD-10-CM | POA: Diagnosis present

## 2014-03-21 DIAGNOSIS — R042 Hemoptysis: Secondary | ICD-10-CM | POA: Diagnosis not present

## 2014-03-21 DIAGNOSIS — T380X5A Adverse effect of glucocorticoids and synthetic analogues, initial encounter: Secondary | ICD-10-CM | POA: Diagnosis not present

## 2014-03-21 DIAGNOSIS — J439 Emphysema, unspecified: Secondary | ICD-10-CM

## 2014-03-21 DIAGNOSIS — R634 Abnormal weight loss: Secondary | ICD-10-CM | POA: Diagnosis present

## 2014-03-21 DIAGNOSIS — J9601 Acute respiratory failure with hypoxia: Secondary | ICD-10-CM

## 2014-03-21 DIAGNOSIS — I428 Other cardiomyopathies: Secondary | ICD-10-CM | POA: Diagnosis present

## 2014-03-21 DIAGNOSIS — K229 Disease of esophagus, unspecified: Secondary | ICD-10-CM | POA: Diagnosis present

## 2014-03-21 DIAGNOSIS — K219 Gastro-esophageal reflux disease without esophagitis: Secondary | ICD-10-CM | POA: Diagnosis present

## 2014-03-21 DIAGNOSIS — I214 Non-ST elevation (NSTEMI) myocardial infarction: Secondary | ICD-10-CM | POA: Diagnosis not present

## 2014-03-21 DIAGNOSIS — J84112 Idiopathic pulmonary fibrosis: Secondary | ICD-10-CM | POA: Diagnosis present

## 2014-03-21 DIAGNOSIS — I447 Left bundle-branch block, unspecified: Secondary | ICD-10-CM | POA: Diagnosis present

## 2014-03-21 DIAGNOSIS — T82898A Other specified complication of vascular prosthetic devices, implants and grafts, initial encounter: Secondary | ICD-10-CM

## 2014-03-21 DIAGNOSIS — D62 Acute posthemorrhagic anemia: Secondary | ICD-10-CM

## 2014-03-21 DIAGNOSIS — N179 Acute kidney failure, unspecified: Secondary | ICD-10-CM | POA: Diagnosis not present

## 2014-03-21 DIAGNOSIS — R000 Tachycardia, unspecified: Secondary | ICD-10-CM | POA: Diagnosis present

## 2014-03-21 DIAGNOSIS — Z833 Family history of diabetes mellitus: Secondary | ICD-10-CM

## 2014-03-21 DIAGNOSIS — J432 Centrilobular emphysema: Secondary | ICD-10-CM

## 2014-03-21 DIAGNOSIS — J690 Pneumonitis due to inhalation of food and vomit: Secondary | ICD-10-CM | POA: Diagnosis not present

## 2014-03-21 DIAGNOSIS — J4489 Other specified chronic obstructive pulmonary disease: Secondary | ICD-10-CM | POA: Diagnosis present

## 2014-03-21 DIAGNOSIS — J438 Other emphysema: Secondary | ICD-10-CM

## 2014-03-21 DIAGNOSIS — T82898D Other specified complication of vascular prosthetic devices, implants and grafts, subsequent encounter: Secondary | ICD-10-CM

## 2014-03-21 HISTORY — DX: Peripheral vascular disease, unspecified: I73.9

## 2014-03-21 LAB — CBC
HCT: 31.2 % — ABNORMAL LOW (ref 39.0–52.0)
Hemoglobin: 10.3 g/dL — ABNORMAL LOW (ref 13.0–17.0)
MCH: 29.9 pg (ref 26.0–34.0)
MCHC: 33 g/dL (ref 30.0–36.0)
MCV: 90.4 fL (ref 78.0–100.0)
PLATELETS: 347 10*3/uL (ref 150–400)
RBC: 3.45 MIL/uL — AB (ref 4.22–5.81)
RDW: 14.2 % (ref 11.5–15.5)
WBC: 7.9 10*3/uL (ref 4.0–10.5)

## 2014-03-21 LAB — COMPREHENSIVE METABOLIC PANEL
ALBUMIN: 3.2 g/dL — AB (ref 3.5–5.2)
ALT: 16 U/L (ref 0–53)
ANION GAP: 14 (ref 5–15)
AST: 20 U/L (ref 0–37)
Alkaline Phosphatase: 88 U/L (ref 39–117)
BUN: 11 mg/dL (ref 6–23)
CALCIUM: 9.2 mg/dL (ref 8.4–10.5)
CO2: 22 mEq/L (ref 19–32)
Chloride: 104 mEq/L (ref 96–112)
Creatinine, Ser: 1.06 mg/dL (ref 0.50–1.35)
GFR calc Af Amer: 76 mL/min — ABNORMAL LOW (ref >90)
GFR calc non Af Amer: 65 mL/min — ABNORMAL LOW (ref >90)
GLUCOSE: 113 mg/dL — AB (ref 70–99)
Potassium: 4.2 mEq/L (ref 3.7–5.3)
SODIUM: 140 meq/L (ref 137–147)
TOTAL PROTEIN: 7.3 g/dL (ref 6.0–8.3)
Total Bilirubin: 0.3 mg/dL (ref 0.3–1.2)

## 2014-03-21 LAB — URINALYSIS, ROUTINE W REFLEX MICROSCOPIC
Bilirubin Urine: NEGATIVE
Glucose, UA: NEGATIVE mg/dL
Hgb urine dipstick: NEGATIVE
KETONES UR: NEGATIVE mg/dL
LEUKOCYTES UA: NEGATIVE
NITRITE: NEGATIVE
Protein, ur: 100 mg/dL — AB
Specific Gravity, Urine: 1.017 (ref 1.005–1.030)
UROBILINOGEN UA: 0.2 mg/dL (ref 0.0–1.0)
pH: 5 (ref 5.0–8.0)

## 2014-03-21 LAB — URINE MICROSCOPIC-ADD ON

## 2014-03-21 LAB — PROTIME-INR
INR: 1.17 (ref 0.00–1.49)
Prothrombin Time: 14.9 seconds (ref 11.6–15.2)

## 2014-03-21 MED ORDER — ASPIRIN EC 81 MG PO TBEC
81.0000 mg | DELAYED_RELEASE_TABLET | Freq: Every day | ORAL | Status: DC
  Administered 2014-03-21 – 2014-03-23 (×2): 81 mg via ORAL
  Filled 2014-03-21 (×4): qty 1

## 2014-03-21 MED ORDER — GUAIFENESIN-DM 100-10 MG/5ML PO SYRP: 15.0000 mL | ORAL_SOLUTION | ORAL | Status: DC | PRN

## 2014-03-21 MED ORDER — BISACODYL 10 MG RE SUPP: 10.0000 mg | Freq: Every day | RECTAL | Status: DC | PRN

## 2014-03-21 MED ORDER — DOCUSATE SODIUM 100 MG PO CAPS
100.0000 mg | ORAL_CAPSULE | Freq: Two times a day (BID) | ORAL | Status: DC
  Administered 2014-03-21 – 2014-03-29 (×15): 100 mg via ORAL
  Filled 2014-03-21 (×17): qty 1

## 2014-03-21 MED ORDER — LISINOPRIL 40 MG PO TABS
40.0000 mg | ORAL_TABLET | Freq: Every day | ORAL | Status: DC
  Administered 2014-03-21 – 2014-03-27 (×6): 40 mg via ORAL
  Filled 2014-03-21 (×7): qty 1

## 2014-03-21 MED ORDER — ACETAMINOPHEN 650 MG RE SUPP: 325.0000 mg | RECTAL | Status: DC | PRN

## 2014-03-21 MED ORDER — SODIUM CHLORIDE 0.9 % IV SOLN
INTRAVENOUS | Status: DC
  Administered 2014-03-21: 20:00:00 via INTRAVENOUS

## 2014-03-21 MED ORDER — CEFAZOLIN SODIUM 1-5 GM-% IV SOLN
1.0000 g | Freq: Once | INTRAVENOUS | Status: AC
  Administered 2014-03-22: 1 g via INTRAVENOUS
  Filled 2014-03-21: qty 50

## 2014-03-21 MED ORDER — ONDANSETRON HCL 4 MG/2ML IJ SOLN: 4.0000 mg | Freq: Four times a day (QID) | INTRAMUSCULAR | Status: DC | PRN

## 2014-03-21 MED ORDER — ADULT MULTIVITAMIN W/MINERALS CH
1.0000 | ORAL_TABLET | Freq: Every day | ORAL | Status: DC
  Administered 2014-03-21 – 2014-04-03 (×13): 1 via ORAL
  Filled 2014-03-21 (×14): qty 1

## 2014-03-21 MED ORDER — HEPARIN BOLUS VIA INFUSION
3000.0000 [IU] | Freq: Once | INTRAVENOUS | Status: AC
  Administered 2014-03-21: 3000 [IU] via INTRAVENOUS
  Filled 2014-03-21: qty 3000

## 2014-03-21 MED ORDER — ATORVASTATIN CALCIUM 80 MG PO TABS
80.0000 mg | ORAL_TABLET | Freq: Every day | ORAL | Status: DC
  Administered 2014-03-22 – 2014-04-03 (×13): 80 mg via ORAL
  Filled 2014-03-21 (×16): qty 1

## 2014-03-21 MED ORDER — ACETAMINOPHEN 325 MG PO TABS: 325.0000 mg | ORAL_TABLET | ORAL | Status: DC | PRN

## 2014-03-21 MED ORDER — PREDNISONE 50 MG PO TABS
50.0000 mg | ORAL_TABLET | Freq: Once | ORAL | Status: AC
  Administered 2014-03-22: 50 mg via ORAL
  Filled 2014-03-21: qty 1

## 2014-03-21 MED ORDER — PREDNISONE 50 MG PO TABS
50.0000 mg | ORAL_TABLET | ORAL | Status: DC
  Administered 2014-03-22: 50 mg via ORAL
  Filled 2014-03-21: qty 1

## 2014-03-21 MED ORDER — LABETALOL HCL 5 MG/ML IV SOLN
10.0000 mg | INTRAVENOUS | Status: DC | PRN
  Filled 2014-03-21: qty 4

## 2014-03-21 MED ORDER — METOPROLOL TARTRATE 1 MG/ML IV SOLN
2.0000 mg | INTRAVENOUS | Status: DC | PRN
  Administered 2014-03-22: 2.5 mg via INTRAVENOUS
  Filled 2014-03-21: qty 5

## 2014-03-21 MED ORDER — PANTOPRAZOLE SODIUM 40 MG PO TBEC
40.0000 mg | DELAYED_RELEASE_TABLET | Freq: Every day | ORAL | Status: DC
  Administered 2014-03-23 – 2014-03-29 (×7): 40 mg via ORAL
  Filled 2014-03-21 (×8): qty 1

## 2014-03-21 MED ORDER — ALUM & MAG HYDROXIDE-SIMETH 200-200-20 MG/5ML PO SUSP: 15.0000 mL | ORAL | Status: DC | PRN

## 2014-03-21 MED ORDER — POTASSIUM CHLORIDE CRYS ER 20 MEQ PO TBCR: 20.0000 meq | EXTENDED_RELEASE_TABLET | Freq: Once | ORAL | Status: DC

## 2014-03-21 MED ORDER — DIPHENHYDRAMINE HCL 50 MG PO CAPS
50.0000 mg | ORAL_CAPSULE | Freq: Once | ORAL | Status: AC
  Administered 2014-03-22: 50 mg via ORAL
  Filled 2014-03-21: qty 1

## 2014-03-21 MED ORDER — HYDRALAZINE HCL 20 MG/ML IJ SOLN: 10.0000 mg | INTRAMUSCULAR | Status: DC | PRN

## 2014-03-21 MED ORDER — PHENOL 1.4 % MT LIQD
1.0000 | OROMUCOSAL | Status: DC | PRN
  Filled 2014-03-21: qty 177

## 2014-03-21 MED ORDER — MORPHINE SULFATE 2 MG/ML IJ SOLN: 2.0000 mg | INTRAMUSCULAR | Status: DC | PRN

## 2014-03-21 MED ORDER — OXYCODONE HCL 5 MG PO TABS
5.0000 mg | ORAL_TABLET | ORAL | Status: DC | PRN
  Administered 2014-03-23 – 2014-03-28 (×6): 5 mg via ORAL
  Filled 2014-03-21 (×6): qty 1

## 2014-03-21 MED ORDER — HEPARIN (PORCINE) IN NACL 100-0.45 UNIT/ML-% IJ SOLN
1700.0000 [IU]/h | INTRAMUSCULAR | Status: DC
  Administered 2014-03-21: 900 [IU]/h via INTRAVENOUS
  Administered 2014-03-22: 1100 [IU]/h via INTRAVENOUS
  Administered 2014-03-23 – 2014-03-24 (×3): 1500 [IU]/h via INTRAVENOUS
  Filled 2014-03-21 (×13): qty 250

## 2014-03-21 NOTE — H&P (Addendum)
[facility] [place]   History of Present Illness  LOC Trevor Morris is a 80 y.o. (08/21/1934) male who presents to the [facility] with chief complaint: left leg pain since yesterday 03/20/14. He previously had a left femoral to below knee popliteal bypass with saphenous vein graft in 1999 by Dr. Hart Rochester. He also had angioplasty of the popliteal artery and tibioperoneal trunk in 2009 by Dr. Myra Gianotti. His pain is localized to his left lower leg and occurs at night. He is not ambulatory due to COPD symptoms. He also noted "blue" changes to his left great toe. He denies any problems with his right leg. He was last seen in the office by Dr. Hart Rochester on 11/20/13 for continued follow-up of his bypass. He had no issues at that time.   He denies any non-healing wounds. Denies any chest pain. Has some shortness of breath with exertion. He has no known history of cardiac arrhythmias. He is not on any blood thinners.   He is on an aspirin and a statin. He is diabetic.   Past Medical History  Diagnosis Date  . Diabetes mellitus   . Hyperlipidemia   . Hypertension   . Anemia   . COPD (chronic obstructive pulmonary disease)   . Peripheral vascular disease     Past Surgical History  Procedure Laterality Date  . Pr vein bypass graft,aorto-fem-pop      History   Social History  . Marital Status: Married    Spouse Name: N/A    Number of Children: N/A  . Years of Education: N/A   Occupational History  . Not on file.   Social History Main Topics  . Smoking status: Former Smoker    Types: Cigarettes    Quit date: 08/24/1987  . Smokeless tobacco: Never Used  . Alcohol Use: No  . Drug Use: No  . Sexual Activity: Not on file   Other Topics Concern  . Not on file   Social History Narrative  . No narrative on file    Family History  Problem Relation Age of Onset  . Diabetes Other   . Diabetes Father   . Hypertension Father     Current Outpatient Prescriptions on File Prior to  Visit  Medication Sig Dispense Refill  . aspirin EC 81 MG tablet Take 81 mg by mouth daily.        Marland Kitchen lisinopril (PRINIVIL,ZESTRIL) 40 MG tablet Take 40 mg by mouth daily.        . metFORMIN (GLUCOPHAGE) 500 MG tablet Take 1,000 mg by mouth 2 (two) times daily with a meal.       . rosuvastatin (CRESTOR) 40 MG tablet Take 40 mg by mouth daily.         No current facility-administered medications on file prior to visit.    Allergies  Allergen Reactions  . Omnipaque [Iohexol] Itching and Rash    REVIEW OF SYSTEMS:  (Positives checked otherwise negative)  CARDIOVASCULAR:  []  chest pain, []  chest pressure, []  palpitations, [x]  shortness of breath when laying flat, []  shortness of breath with exertion,  []  pain in feet when walking, [x]  pain in legs when laying flat, []  history of blood clot in veins (DVT), []  history of phlebitis, []  swelling in legs, []  varicose veins  PULMONARY:  []  productive cough, []  asthma, []  wheezing  NEUROLOGIC:  [x]  weakness in arms or legs, []  numbness in arms or legs, []  difficulty speaking or slurred speech, []  temporary loss of vision  in one eye, []  dizziness  HEMATOLOGIC:  []  bleeding problems, []  problems with blood clotting too easily  MUSCULOSKEL:  []  joint pain, []  joint swelling  GASTROINTEST:  []  vomiting blood, []  blood in stool     GENITOURINARY:  []  burning with urination, []  blood in urine  PSYCHIATRIC:  []  history of major depression  INTEGUMENTARY:  []  rashes, []  ulcers  CONSTITUTIONAL:  []  fever, []  chills  For VQI Use Only    PRE-ADM LIVING: Home  AMB STATUS: Ambulatory  CAD Sx: None  PRIOR CHF: None  Physical Examination  Filed Vitals:   03/21/14 1524  BP: 154/66  Pulse: 88  Height: 5\' 4"  (1.626 m)  Weight: 131 lb 4.8 oz (59.557 kg)  SpO2: 95%    Body mass index is 22.53 kg/(m^2).  General: A&O x 3, WDWN male in NAD  Head: /AT  Eyes: Pupils equal  Neck: Supple  Pulmonary: Sym exp, good air movt, CTAB, no  rales, rhonchi, & wheezing  Cardiac: RRR, Nl S1, S2, no Murmurs, rubs or gallops  Vascular: Vessel Right Left  Femoral Palpable Palpable  Popliteal Not palpable Not palpable  PT Not palpable Not palpable  DP  Not palpable Not palpable    Musculoskeletal: M/S 5/5 throughout. Paleness to left great toe with delayed capillary refill.   Neurologic: CN 2-12 intact. Pain and light touch intact in extremities  Psychiatric: Judgment intact, Mood & affect appropriate for pt's clinical situation  Dermatologic: See M/S exam for extremity exam, no rashes otherwise noted  Non-Invasive Vascular Imaging ABI (Date: 03/21/2014)  RLE: 0.67  LLE: 0.54; evidence of total-occlusion at the distal anastomosis with possible thrombus seen.   Previous ABIs (11/20/13)  RLE: 0.83  LLE: 0.75   Medical Decision Making  Trevor FuCharles H Morris is a 80 y.o. male who presents with: acute occlusion of femoral to below knee popliteal bypass. New onset of rest pain yesterday night. Significant ABI changes since last seen in 11/20/13. Duplex showing near occlusion at distal anastomosis with possible thrombus  Dr[facility] Darrick PennaFields [facility] and place on heparin drip. He will be scheduled for aortogram with runoff and thrombolysis of his left lower extremity bypass by interventional radiology tomorrow 03/22/14.   -Discontinue heparin on call to IR.  -Patient with contrast allergy. Prednisone dosing upon arrival to hospital, at midnight, and on call to IR. Benadryl on call to IR.  -Hold Metformin.    Maris BergerKimberly Keilee Denman, PA-C [facility] [place] Office: [phone] Pager: [phone]  03/21/2014, 4:56 PM  This patient was seen in conjunction with Dr. Darrick PennaFields.

## 2014-03-21 NOTE — Progress Notes (Signed)
[facility] [place]   History of Present Illness  Trevor Morris is a 80 y.o. (1934-08-16) male who presents to the [facility] with chief complaint: left leg pain since yesterday 03/20/14. He previously had a left femoral to below knee popliteal bypass with saphenous vein graft in 1999 by Dr. Hart Rochester. He also had angioplasty of the popliteal artery and tibioperoneal trunk in 2009 by Dr. Myra Gianotti. His pain is localized to his left lower leg and occurs at night. He is not very ambulatory due to COPD symptoms. However, he is the primary caregiver to his wife who has had multiple strokes. He also noted "blue" changes to his left great toe. He denies any problems with his right leg. He was last seen in the office by Dr. Hart Rochester on 11/20/13 for continued follow-up of his bypass. He had no issues at that time.   He denies any non-healing wounds. Denies any chest pain. Has some shortness of breath with exertion. He has no known history of cardiac arrhythmias. He is not on any blood thinners.   He is on an aspirin and a statin. He is diabetic.     Past Medical History   Diagnosis  Date   .  Diabetes mellitus     .  Hyperlipidemia     .  Hypertension     .  Anemia     .  COPD (chronic obstructive pulmonary disease)     .  Peripheral vascular disease         Past Surgical History   Procedure  Laterality  Date   .  Pr vein bypass graft,aorto-fem-pop           History      Social History   .  Marital Status:  Married       Spouse Name:  N/A       Number of Children:  N/A   .  Years of Education:  N/A      Occupational History   .  Not on file.      Social History Main Topics   .  Smoking status:  Former Smoker       Types:  Cigarettes       Quit date:  08/24/1987   .  Smokeless tobacco:  Never Used   .  Alcohol Use:  No   .  Drug Use:  No   .  Sexual Activity:  Not on file      Other Topics  Concern   .  Not on file      Social History Narrative   .  No narrative on  file       Family History   Problem  Relation  Age of Onset   .  Diabetes  Other     .  Diabetes  Father     .  Hypertension  Father         Current Outpatient Prescriptions on File Prior to Visit   Medication  Sig  Dispense  Refill   .  aspirin EC 81 MG tablet  Take 81 mg by mouth daily.           Marland Kitchen  lisinopril (PRINIVIL,ZESTRIL) 40 MG tablet  Take 40 mg by mouth daily.           .  metFORMIN (GLUCOPHAGE) 500 MG tablet  Take 1,000 mg by mouth 2 (two) times daily with a meal.          .  rosuvastatin (CRESTOR) 40 MG tablet  Take 40 mg by mouth daily.              No current facility-administered medications on file prior to visit.       Allergies   Allergen  Reactions   .  Omnipaque [Iohexol]  Itching and Rash     REVIEW OF SYSTEMS:  (Positives checked otherwise negative)  CARDIOVASCULAR:  []  chest pain, []  chest pressure, []  palpitations, [x]  shortness of breath when laying flat, []  shortness of breath with exertion,  []  pain in feet when walking, [x]  pain in legs when laying flat, []  history of blood clot in veins (DVT), []  history of phlebitis, []  swelling in legs, []  varicose veins  PULMONARY:  []  productive cough, []  asthma, []  wheezing  NEUROLOGIC:  [x]  weakness in arms or legs, []  numbness in arms or legs, []  difficulty speaking or slurred speech, []  temporary loss of vision in one eye, []  dizziness  HEMATOLOGIC:  []  bleeding problems, []  problems with blood clotting too easily  MUSCULOSKEL:  []  joint pain, []  joint swelling  GASTROINTEST:  []  vomiting blood, []  blood in stool              GENITOURINARY:  []  burning with urination, []  blood in urine  PSYCHIATRIC:  []  history of major depression  INTEGUMENTARY:  []  rashes, []  ulcers  CONSTITUTIONAL:  []  fever, []  chills  For VQI Use Only     PRE-ADM LIVING: Home  AMB STATUS: Ambulatory  CAD Sx: None  PRIOR CHF: None  Physical Examination    Filed Vitals:     03/21/14 1524   BP:  154/66   Pulse:  88    Height:  5\' 4"  (1.626 m)   Weight:  131 lb 4.8 oz (59.557 kg)   SpO2:  95%     Body mass index is 22.53 kg/(m^2).  General: A&O x 3, WDWN male in NAD  Head: Aubrey/AT  Eyes: Pupils equal  Neck: Supple  Pulmonary: Sym exp, good air movt, CTAB, no rales, rhonchi, & wheezing  Cardiac: RRR, Nl S1, S2, no Murmurs, rubs or gallops  Vascular: Vessel  Right  Left   Femoral  Palpable  Palpable   Popliteal  Not palpable  Not palpable   PT  Not palpable  Not palpable   DP   Not palpable  Not palpable     Musculoskeletal: M/S 5/5 throughout. Paleness to left great toe with delayed capillary refill.   Neurologic: CN 2-12 intact. Pain and light touch intact in extremities  Psychiatric: Judgment intact, Mood & affect appropriate for pt's clinical situation  Dermatologic: See M/S exam for extremity exam, no rashes otherwise noted  Non-Invasive Vascular Imaging ABI (Date: 03/21/2014)  RLE: 0.67  LLE: 0.54; evidence of total-occlusion at the distal anastomosis with possible thrombus seen.  Previous ABIs (11/20/13)  RLE: 0.83  LLE: 0.75   Medical Decision Making  [name] is a 80 y.o. male who presents with: acute occlusion of femoral to below knee popliteal bypass. New onset of rest pain yesterday night. Significant ABI changes since last seen in 11/20/13. Duplex showing near occlusion at distal anastomosis with possible thrombus  Dr. Darrick PennaFields will [facility] and place on heparin drip. He will be scheduled for aortogram with runoff and thrombolysis of his left lower extremity bypass by interventional radiology tomorrow 03/22/14.   -Discontinue heparin on call to IR.   -Prednisone dosing upon arrival to  hospital, at midnight, and on call to IR -Benadryl on call to IR.   -Hold Metformin.     [name], PA-C [facility] [place] Office: [phone] Pager: [phone]  03/21/2014, 4:56 PM  This patient was seen in  conjunction with Dr. Darrick Penna.     History and exam details as above. The patient has a component of nighttime rest pain. Duplex ultrasound today shows occlusion of the femoral to below-knee popliteal bypass. His history is consistent with a fairly acute occlusion as of 3 AM yesterday morning. He could possibly have narrowing of the outflow but I believe the best option is thrombolysis since his motor and sensory exam essentially is intact. This would give [place] a better image of anything that needs to be revised or possibly angioplasty. We will admit the patient today for heparin. I spoke with Dr. Denny Levy today from interventional radiology and he is scheduled for thrombolysis tomorrow.  Fabienne Bruns, MD [facility] [place] Office: [phone] Pager: [phone]

## 2014-03-21 NOTE — Progress Notes (Signed)
ANTICOAGULATION CONSULT NOTE - Initial Consult  Pharmacy Consult for Heparin Indication: acute occlusion of left femoral-below-knee popliteal bypass  Allergies  Allergen Reactions  . Omnipaque [Iohexol] Itching and Rash    Patient Measurements: Height: 5\' 4"  (162.6 cm) Weight: 131 lb 4.8 oz (59.557 kg) IBW/kg (Calculated) : 59.2  Vital Signs: Temp: 97.9 F (36.6 C) (07/30 1700) Temp src: Oral (07/30 1700) BP: 149/62 mmHg (07/30 1700) Pulse Rate: 94 (07/30 1700)  Labs: No results found for this basename: HGB, HCT, PLT, APTT, LABPROT, INR, HEPARINUNFRC, CREATININE, CKTOTAL, CKMB, TROPONINI,  in the last 72 hours  Estimated Creatinine Clearance: 46.3 ml/min (by C-G formula based on Cr of 1.1).   Medical History: Past Medical History  Diagnosis Date  . Diabetes mellitus   . Hyperlipidemia   . Hypertension   . Anemia   . COPD (chronic obstructive pulmonary disease)   . Peripheral vascular disease     Medications:  Prescriptions prior to admission  Medication Sig Dispense Refill  . aspirin EC 81 MG tablet Take 81 mg by mouth daily.        Marland Kitchen lisinopril (PRINIVIL,ZESTRIL) 40 MG tablet Take 40 mg by mouth daily.        . metFORMIN (GLUCOPHAGE) 500 MG tablet Take 1,000 mg by mouth 2 (two) times daily with a meal.       . rosuvastatin (CRESTOR) 40 MG tablet Take 40 mg by mouth daily.          Assessment: 80 y.o. male presented to [facility] with left leg pain - found to have acute occlusion of left femoral-below-knee popliteal bypass. Pt admitted and to begin heparin gtt. Plan for aortogram and thrombolysis of LLE bypass in IR 7/31. Heparin to be turned off on call to IR.  Goal of Therapy:  Heparin level 0.3-0.7 units/ml Monitor platelets by anticoagulation protocol: Yes   Plan:  1. Heparin IV bolus 3000 units 2. Heparin gtt at 900 units/hr 3. Heparin to be turned off on call to IR. 4. Will f/u 8 hr heparin level  Christoper Fabian, PharmD, BCPS Clinical pharmacist, pager  [phone] 03/21/2014,6:38 PM

## 2014-03-21 NOTE — Progress Notes (Signed)
Patient expressed concerns regarding prednisone for tonight; he has an allergy to Omnipaque.  Patient states he breaks out in blisters around his penis area when he takes it.  Notified Dr. Imogene Burn regarding his concerns.  Related message to patient that there was no other alternatives for him to take before his procedure since he does have a reaction.  Patient stated that he would take the prednisone so he could have the procedure done. Will continue to monitor.

## 2014-03-21 NOTE — Telephone Encounter (Signed)
Phone call from pt.  Reported onset of severe pain in left leg, from knee to foot, the night of 03/19/14.  Also reported he had a bluish discoloration of left great, 2nd, and 3rd toes, initially, which has since cleared-up.  Stated the pain is mainly at rest, but when he walks very far, he feels like the left leg will "give-out" on him.  Also reports "tingling in the tips of toes left foot."  Stated last night his left leg hurt so bad, he couldn't sleep. Discussed with Dr. Darrick Penna.  Advised to bring to office today for left LE art. Duplex, ABI's, and office visit.  Notified pt. of appt. for 1:30 PM today.  Agrees with plan.

## 2014-03-22 ENCOUNTER — Inpatient Hospital Stay (HOSPITAL_COMMUNITY): Payer: Medicare Other

## 2014-03-22 ENCOUNTER — Encounter (HOSPITAL_COMMUNITY): Payer: Self-pay | Admitting: Radiology

## 2014-03-22 LAB — GLUCOSE, CAPILLARY
GLUCOSE-CAPILLARY: 237 mg/dL — AB (ref 70–99)
GLUCOSE-CAPILLARY: 333 mg/dL — AB (ref 70–99)
Glucose-Capillary: 255 mg/dL — ABNORMAL HIGH (ref 70–99)

## 2014-03-22 LAB — CBC
HCT: 28.8 % — ABNORMAL LOW (ref 39.0–52.0)
HEMATOCRIT: 29.5 % — AB (ref 39.0–52.0)
HEMATOCRIT: 30.4 % — AB (ref 39.0–52.0)
HEMOGLOBIN: 10.1 g/dL — AB (ref 13.0–17.0)
Hemoglobin: 9.8 g/dL — ABNORMAL LOW (ref 13.0–17.0)
Hemoglobin: 9.7 g/dL — ABNORMAL LOW (ref 13.0–17.0)
MCH: 30 pg (ref 26.0–34.0)
MCH: 30.5 pg (ref 26.0–34.0)
MCH: 30.6 pg (ref 26.0–34.0)
MCHC: 33.2 g/dL (ref 30.0–36.0)
MCHC: 33.2 g/dL (ref 30.0–36.0)
MCHC: 33.7 g/dL (ref 30.0–36.0)
MCV: 90.2 fL (ref 78.0–100.0)
MCV: 91.8 fL (ref 78.0–100.0)
MCV: 90.9 fL (ref 78.0–100.0)
PLATELETS: 309 10*3/uL (ref 150–400)
Platelets: 323 10*3/uL (ref 150–400)
Platelets: 348 10*3/uL (ref 150–400)
RBC: 3.27 MIL/uL — ABNORMAL LOW (ref 4.22–5.81)
RBC: 3.31 MIL/uL — ABNORMAL LOW (ref 4.22–5.81)
RBC: 3.17 MIL/uL — ABNORMAL LOW (ref 4.22–5.81)
RDW: 14.2 % (ref 11.5–15.5)
RDW: 14.1 % (ref 11.5–15.5)
RDW: 14 % (ref 11.5–15.5)
WBC: 8.5 10*3/uL (ref 4.0–10.5)
WBC: 9.1 10*3/uL (ref 4.0–10.5)
WBC: 13.1 10*3/uL — ABNORMAL HIGH (ref 4.0–10.5)

## 2014-03-22 LAB — BASIC METABOLIC PANEL
Anion gap: 12 (ref 5–15)
BUN: 12 mg/dL (ref 6–23)
CO2: 21 meq/L (ref 19–32)
CREATININE: 1.02 mg/dL (ref 0.50–1.35)
Calcium: 9 mg/dL (ref 8.4–10.5)
Chloride: 103 mEq/L (ref 96–112)
GFR calc non Af Amer: 68 mL/min — ABNORMAL LOW (ref >90)
GFR, EST AFRICAN AMERICAN: 79 mL/min — AB (ref >90)
Glucose, Bld: 194 mg/dL — ABNORMAL HIGH (ref 70–99)
Potassium: 4.5 mEq/L (ref 3.7–5.3)
Sodium: 136 mEq/L — ABNORMAL LOW (ref 137–147)

## 2014-03-22 LAB — HEMOGLOBIN A1C
Hgb A1c MFr Bld: 7.7 % — ABNORMAL HIGH (ref <5.7)
MEAN PLASMA GLUCOSE: 174 mg/dL — AB (ref <117)

## 2014-03-22 LAB — FIBRINOGEN
FIBRINOGEN: 551 mg/dL — AB (ref 204–475)
FIBRINOGEN: 516 mg/dL — AB (ref 204–475)

## 2014-03-22 LAB — HEPARIN LEVEL (UNFRACTIONATED)
Heparin Unfractionated: <0.1 IU/mL — ABNORMAL LOW (ref 0.30–0.70)
Heparin Unfractionated: 0.17 IU/mL — ABNORMAL LOW (ref 0.30–0.70)
Heparin Unfractionated: 0.1 IU/mL — ABNORMAL LOW (ref 0.30–0.70)

## 2014-03-22 LAB — MRSA PCR SCREENING: MRSA BY PCR: NEGATIVE

## 2014-03-22 MED ORDER — ONDANSETRON HCL 4 MG/2ML IJ SOLN: 4.0000 mg | Freq: Four times a day (QID) | INTRAMUSCULAR | Status: DC | PRN

## 2014-03-22 MED ORDER — SODIUM CHLORIDE 0.9 % IV SOLN
250.0000 mL | INTRAVENOUS | Status: DC | PRN
  Administered 2014-03-25 (×2): 250 mL via INTRAVENOUS

## 2014-03-22 MED ORDER — IOHEXOL 300 MG/ML  SOLN
150.0000 mL | Freq: Once | INTRAMUSCULAR | Status: AC | PRN
  Administered 2014-03-22: 1 mL via INTRA_ARTERIAL

## 2014-03-22 MED ORDER — TENECTEPLASE 50 MG IV KIT
0.2500 mg/h | PACK | INTRAVENOUS | Status: DC
  Administered 2014-03-22 – 2014-03-23 (×5): 0.5 mg/h via INTRAVENOUS
  Administered 2014-03-23: 0.25 mg/h via INTRAVENOUS
  Filled 2014-03-22 (×5): qty 0.5

## 2014-03-22 MED ORDER — MORPHINE SULFATE 4 MG/ML IJ SOLN: 5.0000 mg | INTRAMUSCULAR | Status: DC | PRN

## 2014-03-22 MED ORDER — HEPARIN SODIUM (PORCINE) 1000 UNIT/ML IJ SOLN
INTRAMUSCULAR | Status: AC | PRN
  Administered 2014-03-22: 1000 [IU] via INTRAVENOUS
  Administered 2014-03-22: 4000 [IU] via INTRAVENOUS

## 2014-03-22 MED ORDER — MIDAZOLAM HCL 2 MG/2ML IJ SOLN
1.0000 mg | INTRAMUSCULAR | Status: DC | PRN
Start: 2014-03-22 — End: 2014-04-02

## 2014-03-22 MED ORDER — INSULIN ASPART 100 UNIT/ML ~~LOC~~ SOLN
0.0000 [IU] | Freq: Every day | SUBCUTANEOUS | Status: DC
  Administered 2014-03-22: 4 [IU] via SUBCUTANEOUS
  Administered 2014-03-23: 2 [IU] via SUBCUTANEOUS
  Administered 2014-03-24: 3 [IU] via SUBCUTANEOUS

## 2014-03-22 MED ORDER — SODIUM CHLORIDE 0.9 % IJ SOLN
3.0000 mL | INTRAMUSCULAR | Status: DC | PRN
  Administered 2014-03-25: 3 mL via INTRAVENOUS

## 2014-03-22 MED ORDER — SODIUM CHLORIDE 0.9 % IJ SOLN
3.0000 mL | Freq: Two times a day (BID) | INTRAMUSCULAR | Status: DC
  Administered 2014-03-22 – 2014-04-03 (×17): 3 mL via INTRAVENOUS

## 2014-03-22 MED ORDER — HEPARIN (PORCINE) IN NACL 100-0.45 UNIT/ML-% IJ SOLN: 800.0000 [IU]/h | INTRAMUSCULAR | Status: DC

## 2014-03-22 MED ORDER — FENTANYL CITRATE 0.05 MG/ML IJ SOLN
INTRAMUSCULAR | Status: AC | PRN
  Administered 2014-03-22 (×2): 50 ug via INTRAVENOUS

## 2014-03-22 MED ORDER — DIPHENHYDRAMINE HCL 50 MG PO CAPS
50.0000 mg | ORAL_CAPSULE | Freq: Once | ORAL | Status: AC
  Administered 2014-03-23: 50 mg via ORAL
  Filled 2014-03-22: qty 2
  Filled 2014-03-22: qty 1

## 2014-03-22 MED ORDER — HEPARIN SODIUM (PORCINE) 1000 UNIT/ML IJ SOLN
INTRAMUSCULAR | Status: AC
  Filled 2014-03-22: qty 1

## 2014-03-22 MED ORDER — GLUCERNA SHAKE PO LIQD
237.0000 mL | Freq: Three times a day (TID) | ORAL | Status: DC
  Administered 2014-03-22 – 2014-04-04 (×24): 237 mL via ORAL
  Filled 2014-03-22 (×2): qty 237

## 2014-03-22 MED ORDER — MIDAZOLAM HCL 2 MG/2ML IJ SOLN
INTRAMUSCULAR | Status: AC
  Filled 2014-03-22: qty 4

## 2014-03-22 MED ORDER — SODIUM CHLORIDE 0.9 % IV SOLN: 0.5000 mg/h | INTRAVENOUS | Status: DC

## 2014-03-22 MED ORDER — MIDAZOLAM HCL 2 MG/2ML IJ SOLN
INTRAMUSCULAR | Status: AC | PRN
  Administered 2014-03-22 (×2): 1 mg via INTRAVENOUS

## 2014-03-22 MED ORDER — INSULIN ASPART 100 UNIT/ML ~~LOC~~ SOLN
0.0000 [IU] | Freq: Three times a day (TID) | SUBCUTANEOUS | Status: DC
  Administered 2014-03-22: 3 [IU] via SUBCUTANEOUS
  Administered 2014-03-23: 5 [IU] via SUBCUTANEOUS
  Administered 2014-03-23: 9 [IU] via SUBCUTANEOUS
  Administered 2014-03-24: 7 [IU] via SUBCUTANEOUS
  Administered 2014-03-24: 5 [IU] via SUBCUTANEOUS
  Administered 2014-03-24: 3 [IU] via SUBCUTANEOUS

## 2014-03-22 MED ORDER — PREDNISONE 50 MG PO TABS
50.0000 mg | ORAL_TABLET | Freq: Four times a day (QID) | ORAL | Status: AC
  Administered 2014-03-22 – 2014-03-23 (×3): 50 mg via ORAL
  Filled 2014-03-22 (×5): qty 1

## 2014-03-22 MED ORDER — FENTANYL CITRATE 0.05 MG/ML IJ SOLN
INTRAMUSCULAR | Status: AC
  Filled 2014-03-22: qty 4

## 2014-03-22 NOTE — Progress Notes (Signed)
CBG this am is 255 mg/dl.  Recommend starting Novolog SENSITIVE correction scale AC & HS while in the hospital.  Change diet to CHO Modified Medium diet.  Continue checking CBGs AC & HS.  Check HgbA1C.  Will continue to follow while in hospital.  Smith Mince RN BSN CDE

## 2014-03-22 NOTE — Progress Notes (Signed)
INITIAL NUTRITION ASSESSMENT  DOCUMENTATION CODES Per approved criteria  -Not Applicable   INTERVENTION: Glucerna Shake po TID, each supplement provides 220 kcal and 10 grams of protein  NUTRITION DIAGNOSIS: Inadequate oral intake related to COPD as evidenced by weight loss.   Goal: Pt to meet >/= 90% of their estimated nutrition needs   Monitor:  Weight trends, po intake, acceptance of supplements, labs  Reason for Assessment: MST  80 y.o. male  Admitting Dx: <principal problem not specified>  ASSESSMENT: 80 y.o. male with prior hx of PAD with LLE Fem-below knee popliteal bypass graft with saphenous vein. Was originally done 1999, per pt, has had to have open thrombectomy at least once in the past. Developed acute onset of pain in foot with coldness and bluish discoloration of toes. Has been admitted by [facility] and started on heparin drip.  - Pt reports that he has not been eating well due to COPD.  - He says that he has had a 12 lb weight loss in the past 6 months.  - Pt says that he is starting to feel hungry at this time. - He drinks Glucerna Shakes at home occasionally.  - Pt with signs of moderate muscle wasting around the temples.   CBG- 255 Na and K WNL  Height: Ht Readings from Last 1 Encounters:  03/21/14 5\' 4"  (1.626 m)    Weight: Wt Readings from Last 1 Encounters:  03/21/14 131 lb 4.8 oz (59.557 kg)    Ideal Body Weight: 59.2 kg  % Ideal Body Weight: 101%  Wt Readings from Last 10 Encounters:  03/21/14 131 lb 4.8 oz (59.557 kg)  03/21/14 131 lb 4.8 oz (59.557 kg)  11/20/13 139 lb (63.05 kg)  11/14/12 140 lb (63.504 kg)    Usual Body Weight: ~140 lbs  % Usual Body Weight: 94%  BMI:  Body mass index is 22.53 kg/(m^2).  Estimated Nutritional Needs: Kcal: 1800-2100 Protein: 100-110 g Fluid: 1.8-2.1 L/day  Skin: WNL  Diet Order: NPO  EDUCATION NEEDS: -Education needs addressed   Intake/Output Summary (Last 24 hours) at 03/22/14  1443 Last data filed at 03/22/14 0909  Gross per 24 hour  Intake    472 ml  Output    375 ml  Net     97 ml    Last BM: Prior to admission   Labs:   Recent Labs Lab 03/21/14 1929 03/22/14 0331  NA 140 136*  K 4.2 4.5  CL 104 103  CO2 22 21  BUN 11 12  CREATININE 1.06 1.02  CALCIUM 9.2 9.0  GLUCOSE 113* 194*    CBG (last 3)   Recent Labs  03/22/14 0620  GLUCAP 255*    Scheduled Meds: . aspirin EC  81 mg Oral Daily  . atorvastatin  80 mg Oral q1800  . docusate sodium  100 mg Oral BID  . fentaNYL      . heparin      . insulin aspart  0-5 Units Subcutaneous QHS  . insulin aspart  0-9 Units Subcutaneous TID WC  . lisinopril  40 mg Oral Daily  . midazolam      . multivitamin with minerals  1 tablet Oral Daily  . pantoprazole  40 mg Oral Daily  . potassium chloride  20-40 mEq Oral Once  . predniSONE  50 mg Oral Once per day on Thu Fri    Continuous Infusions: . sodium chloride 30 mL/hr at 03/21/14 2003  . heparin 1,100 Units/hr (03/22/14 1339)  .  tenecteplase (TNKase) infusion 0.5 mg/hr (03/22/14 1338)    Past Medical History  Diagnosis Date  . Diabetes mellitus   . Hyperlipidemia   . Hypertension   . Anemia   . COPD (chronic obstructive pulmonary disease)   . Peripheral vascular disease     Past Surgical History  Procedure Laterality Date  . Pr vein bypass graft,aorto-fem-pop      Ebbie Latus RD, LDN

## 2014-03-22 NOTE — Progress Notes (Deleted)
Patient arrived to unit stable. Patient is alert and oriented x4 Slightly elevated blood pressure reading (160's/80's). Left femoral dressing is clean, dry, and intact.  Posterior tibial pulses noted with doppler bilaterally. EKG shows pt is in NSR with BBB. No complaints. Family is at the bedside.

## 2014-03-22 NOTE — Progress Notes (Addendum)
ANTICOAGULATION CONSULT NOTE - Follow Up Consult  Pharmacy Consult for heparin Indication: acute occlusion of left femoral-below-knee popliteal bypass  Labs:  Recent Labs  03/21/14 1929 03/22/14 0331  HGB 10.3* 9.8*  HCT 31.2* 29.5*  PLT 347 323  LABPROT 14.9  --   INR 1.17  --   HEPARINUNFRC  --  <0.10*  CREATININE 1.06  --     Assessment: 80yo male undetectable on heparin with initial dosing for occlusion of fem-pop, awaiting thrombosis by IR, IR schedule not yet posted.  Goal of Therapy:  Heparin level 0.3-0.7 units/ml   Plan:  Will increase heparin gtt by ~4 units/kg/hr to 1100 units/hr until off for IR procedure and f/u after IR to resume heparin.  Vernard Gambles, PharmD, BCPS  03/22/2014,4:22 AM   ADDENDUM: Pt now s/p LLE arterial lysis. Heparin resumed at 1100 units/hr in IR. TNK also started at 0.5mg /hr. CBC this morning stable. Heparin level not rechecked at new rate as drip was turned off in preparation for IR procedure.  Goal of Therapy: Heparin level 0.2-0.5 units/mL while on TNK  Plan: 1. Continue TNK as ordered by MD 2. Heparin 1100 units/hr 3. Heparin level at 2100 tonight 4. Daily HL and CBC while on heparin  Brexton Sofia D. Starleen Trussell, PharmD, BCPS Clinical Pharmacist Pager: [phone] 03/22/2014 2:08 PM

## 2014-03-22 NOTE — Consult Note (Signed)
Reason for consult: Thrombosed left LE arterial bypoass grafy Referring Physician:Fields HPI: Trevor Morris is an 80 y.o. male with prior hx of PAD with LLE Fem-below knee popliteal bypass graft with saphenous vein. Was originally done 1999, per pt, has had to have open thrombectomy at least once in the past. Developed acute onset of pain in foot with coldness and bluish discoloration of toes. Has been admitted by [facility] and started on heparin drip. IR is requested to attempt catheter directed thrombolysis for this. Chart, PMHx, meds, labs reviewed. Pt reports some rest pain at moment, but it is a little better since admission and heparin.  Past Medical History:  Past Medical History  Diagnosis Date  . Diabetes mellitus   . Hyperlipidemia   . Hypertension   . Anemia   . COPD (chronic obstructive pulmonary disease)   . Peripheral vascular disease     Past Surgical History:  Past Surgical History  Procedure Laterality Date  . Pr vein bypass graft,aorto-fem-pop      Family History:  Family History  Problem Relation Age of Onset  . Diabetes Other   . Diabetes Father   . Hypertension Father     Social History:  reports that he quit smoking about 26 years ago. His smoking use included Cigarettes. He smoked 0.00 packs per day. He has never used smokeless tobacco. He reports that he does not drink alcohol or use illicit drugs.  Allergies:  Allergies  Allergen Reactions  . Prednisone Hives  . Omnipaque [Iohexol] Itching and Rash    Medications:   Medication List    ASK your doctor about these medications       aspirin EC 81 MG tablet  Take 81 mg by mouth daily.     lisinopril 40 MG tablet  Commonly known as:  PRINIVIL,ZESTRIL  Take 40 mg by mouth daily.     metFORMIN 1000 MG tablet  Commonly known as:  GLUCOPHAGE  Take 1,000 mg by mouth 2 (two) times daily with a meal.     multivitamin with minerals Tabs tablet  Take 1 tablet by mouth daily.     rosuvastatin 40  MG tablet  Commonly known as:  CRESTOR  Take 40 mg by mouth daily.        Please HPI for pertinent positives, otherwise complete 10 system ROS negative.  Physical Exam: BP 166/81  Pulse 88  Temp(Src) 97.9 F (36.6 C) (Oral)  Resp 18  Ht 5' 4"  (1.626 m)  Wt 131 lb 4.8 oz (59.557 kg)  BMI 22.53 kg/m2  SpO2 95% Body mass index is 22.53 kg/(m^2).   General Appearance:  Alert, cooperative, no distress, appears stated age  Head:  Normocephalic, without obvious abnormality, atraumatic  ENT: Unremarkable  Neck: Supple, symmetrical, trachea midline  Lungs:   Clear to auscultation bilaterally, no w/r/r  Heart:  Regular rate and rhythm, S1, S2 normal, no murmur, rub or gallop.  Abdomen:   Soft, non-tender, non distended.  Extremities: Left foot cooler than right, decreased cap refill but sensation and motor function in take  Pulses: Palpable (L)femoral pulse,   Neurologic: Normal affect, no gross deficits.   Results for orders placed during the hospital encounter of 03/21/14 (from the past 48 hour(s))  CBC     Status: Abnormal   Collection Time    03/21/14  7:29 PM      Result Value Ref Range   WBC 7.9  4.0 - 10.5 K/uL   RBC 3.45 (*) 4.22 -  5.81 MIL/uL   Hemoglobin 10.3 (*) 13.0 - 17.0 g/dL   HCT 31.2 (*) 39.0 - 52.0 %   MCV 90.4  78.0 - 100.0 fL   MCH 29.9  26.0 - 34.0 pg   MCHC 33.0  30.0 - 36.0 g/dL   RDW 14.2  11.5 - 15.5 %   Platelets 347  150 - 400 K/uL  COMPREHENSIVE METABOLIC PANEL     Status: Abnormal   Collection Time    03/21/14  7:29 PM      Result Value Ref Range   Sodium 140  137 - 147 mEq/L   Potassium 4.2  3.7 - 5.3 mEq/L   Chloride 104  96 - 112 mEq/L   CO2 22  19 - 32 mEq/L   Glucose, Bld 113 (*) 70 - 99 mg/dL   BUN 11  6 - 23 mg/dL   Creatinine, Ser 1.06  0.50 - 1.35 mg/dL   Calcium 9.2  8.4 - 10.5 mg/dL   Total Protein 7.3  6.0 - 8.3 g/dL   Albumin 3.2 (*) 3.5 - 5.2 g/dL   AST 20  0 - 37 U/L   ALT 16  0 - 53 U/L   Alkaline Phosphatase 88  39 -  117 U/L   Total Bilirubin 0.3  0.3 - 1.2 mg/dL   GFR calc non Af Amer 65 (*) >90 mL/min   GFR calc Af Amer 76 (*) >90 mL/min   Comment: (NOTE)     The eGFR has been calculated using the CKD EPI equation.     This calculation has not been validated in all clinical situations.     eGFR's persistently <90 mL/min signify possible Chronic Kidney     Disease.   Anion gap 14  5 - 15  PROTIME-INR     Status: None   Collection Time    03/21/14  7:29 PM      Result Value Ref Range   Prothrombin Time 14.9  11.6 - 15.2 seconds   INR 1.17  0.00 - 1.49  URINALYSIS, ROUTINE W REFLEX MICROSCOPIC     Status: Abnormal   Collection Time    03/21/14  9:06 PM      Result Value Ref Range   Color, Urine YELLOW  YELLOW   APPearance CLEAR  CLEAR   Specific Gravity, Urine 1.017  1.005 - 1.030   pH 5.0  5.0 - 8.0   Glucose, UA NEGATIVE  NEGATIVE mg/dL   Hgb urine dipstick NEGATIVE  NEGATIVE   Bilirubin Urine NEGATIVE  NEGATIVE   Ketones, ur NEGATIVE  NEGATIVE mg/dL   Protein, ur 100 (*) NEGATIVE mg/dL   Urobilinogen, UA 0.2  0.0 - 1.0 mg/dL   Nitrite NEGATIVE  NEGATIVE   Leukocytes, UA NEGATIVE  NEGATIVE  URINE MICROSCOPIC-ADD ON     Status: Abnormal   Collection Time    03/21/14  9:06 PM      Result Value Ref Range   WBC, UA 0-2  <3 WBC/hpf   RBC / HPF 0-2  <3 RBC/hpf   Bacteria, UA RARE  RARE   Casts HYALINE CASTS (*) NEGATIVE   Urine-Other MUCOUS PRESENT    BASIC METABOLIC PANEL     Status: Abnormal   Collection Time    03/22/14  3:31 AM      Result Value Ref Range   Sodium 136 (*) 137 - 147 mEq/L   Potassium 4.5  3.7 - 5.3 mEq/L   Chloride 103  96 -  112 mEq/L   CO2 21  19 - 32 mEq/L   Glucose, Bld 194 (*) 70 - 99 mg/dL   BUN 12  6 - 23 mg/dL   Creatinine, Ser 1.02  0.50 - 1.35 mg/dL   Calcium 9.0  8.4 - 10.5 mg/dL   GFR calc non Af Amer 68 (*) >90 mL/min   GFR calc Af Amer 79 (*) >90 mL/min   Comment: (NOTE)     The eGFR has been calculated using the CKD EPI equation.     This  calculation has not been validated in all clinical situations.     eGFR's persistently <90 mL/min signify possible Chronic Kidney     Disease.   Anion gap 12  5 - 15  CBC     Status: Abnormal   Collection Time    03/22/14  3:31 AM      Result Value Ref Range   WBC 8.5  4.0 - 10.5 K/uL   RBC 3.27 (*) 4.22 - 5.81 MIL/uL   Hemoglobin 9.8 (*) 13.0 - 17.0 g/dL   HCT 29.5 (*) 39.0 - 52.0 %   MCV 90.2  78.0 - 100.0 fL   MCH 30.0  26.0 - 34.0 pg   MCHC 33.2  30.0 - 36.0 g/dL   RDW 14.2  11.5 - 15.5 %   Platelets 323  150 - 400 K/uL  HEPARIN LEVEL (UNFRACTIONATED)     Status: Abnormal   Collection Time    03/22/14  3:31 AM      Result Value Ref Range   Heparin Unfractionated <0.10 (*) 0.30 - 0.70 IU/mL   Comment:            IF HEPARIN RESULTS ARE BELOW     EXPECTED VALUES, AND PATIENT     DOSAGE HAS BEEN CONFIRMED,     SUGGEST FOLLOW UP TESTING     OF ANTITHROMBIN III LEVELS.     REPEATED TO VERIFY  GLUCOSE, CAPILLARY     Status: Abnormal   Collection Time    03/22/14  6:20 AM      Result Value Ref Range   Glucose-Capillary 255 (*) 70 - 99 mg/dL   Dg Chest 2 View  03/21/2014   CLINICAL DATA:  Preoperative evaluation; hypertension ; emphysema  EXAM: CHEST  2 VIEW  COMPARISON:  None.  FINDINGS: There is underlying emphysema. There is extensive interstitial fibrosis bilaterally. There may be a degree of interstitial edema superimposed. There is no airspace consolidation. The the heart size is normal. The pulmonary vascularity reflects underlying emphysema. No adenopathy. No bone lesions.  IMPRESSION: Emphysema with widespread interstitial fibrosis. There may be some superimposed interstitial edema ; a degree of superimposed congestive heart failure cannot be excluded radiographically. There is no airspace consolidation or adenopathy apparent.   Electronically Signed   By: Lowella Grip M.D.   On: 03/21/2014 21:35    Assessment/Plan Thrombosed LLE saphenous vain bypass graft For  initiation of catheter directed thrombolysis today. Discussed procedure, risks, complications, use of sedation Labs reviewed. No prior history of PUD, intracranial hemorrhage/aneurysm. No recent procedures. Consent signed in chart   Ascencion Dike PA-C 03/22/2014, 8:29 AM

## 2014-03-22 NOTE — Procedures (Signed)
LLE Arterial lysis FEM pop occluded R groin access TNK 0.5 mg/hr 135/50 cm 5 Fr infusion cath No comp

## 2014-03-22 NOTE — Progress Notes (Addendum)
Patient arrived to unit stable. Patient is alert and oriented x4. Slightly elevated blood pressure reading (160's/80's). Right femoral dressing is clean, dry, and intact. Posterior tibial pulses noted with doppler bilaterally. EKG shows pt is in NSR with BBB. No complaints. Family is at the bedside.

## 2014-03-22 NOTE — Progress Notes (Signed)
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  Heparin drip restarted 1100 uts/hr with HL 0.1 < goal.  No bleeding noted, CBC stable.    Goal of Therapy:  Heparin level 0.2-0.5 with on TNKase     Plan: 1. Continue TNK as ordered by MD 2.  Increase Heparin drip 1300 uts/hr 3. Check HL, CBC daily  Leota Sauers Pharm.D. CPP, BCPS Clinical Pharmacist [phone] 03/22/2014 10:07 PM

## 2014-03-22 NOTE — Progress Notes (Signed)
Paged MD (Dr. Darrick Penna) to ask about patient's baseline pulses in feet. Informed MD that pt has only bilateral posterior tibial pulses that were dopplered. MD aware that patient does not have bilateral pedal pulses. Informed MD patient is slightly hypertensive. Will give PRN blood pressure meds. Also per MD, patient can have clear liquids until midnight.

## 2014-03-22 NOTE — Progress Notes (Signed)
Angio films reviewed with Dr Bonnielee Haff.  Lysis catheter in place.  Follow up film tomorrow.  Fabienne Bruns, MD [facility] [place] Office: [phone] Pager: [phone]

## 2014-03-22 NOTE — Progress Notes (Signed)
Utilization review completed. Bayley Yarborough, RN, BSN. 

## 2014-03-23 ENCOUNTER — Inpatient Hospital Stay (HOSPITAL_COMMUNITY): Payer: Medicare Other

## 2014-03-23 DIAGNOSIS — I739 Peripheral vascular disease, unspecified: Secondary | ICD-10-CM

## 2014-03-23 LAB — FIBRINOGEN
FIBRINOGEN: 435 mg/dL (ref 204–475)
FIBRINOGEN: 428 mg/dL (ref 204–475)
Fibrinogen: 455 mg/dL (ref 204–475)

## 2014-03-23 LAB — GLUCOSE, CAPILLARY
GLUCOSE-CAPILLARY: 290 mg/dL — AB (ref 70–99)
GLUCOSE-CAPILLARY: 336 mg/dL — AB (ref 70–99)
Glucose-Capillary: 282 mg/dL — ABNORMAL HIGH (ref 70–99)
Glucose-Capillary: 237 mg/dL — ABNORMAL HIGH (ref 70–99)

## 2014-03-23 LAB — CBC
HCT: 27.1 % — ABNORMAL LOW (ref 39.0–52.0)
HCT: 30.6 % — ABNORMAL LOW (ref 39.0–52.0)
HEMATOCRIT: 29.1 % — AB (ref 39.0–52.0)
HEMOGLOBIN: 10.3 g/dL — AB (ref 13.0–17.0)
HEMOGLOBIN: 9.6 g/dL — AB (ref 13.0–17.0)
Hemoglobin: 9 g/dL — ABNORMAL LOW (ref 13.0–17.0)
MCH: 29.8 pg (ref 26.0–34.0)
MCH: 30.7 pg (ref 26.0–34.0)
MCH: 30.1 pg (ref 26.0–34.0)
MCHC: 33.2 g/dL (ref 30.0–36.0)
MCHC: 33.7 g/dL (ref 30.0–36.0)
MCHC: 33 g/dL (ref 30.0–36.0)
MCV: 89.7 fL (ref 78.0–100.0)
MCV: 91.3 fL (ref 78.0–100.0)
MCV: 91.2 fL (ref 78.0–100.0)
PLATELETS: 318 10*3/uL (ref 150–400)
Platelets: 359 10*3/uL (ref 150–400)
Platelets: 394 10*3/uL (ref 150–400)
RBC: 3.02 MIL/uL — ABNORMAL LOW (ref 4.22–5.81)
RBC: 3.35 MIL/uL — AB (ref 4.22–5.81)
RBC: 3.19 MIL/uL — AB (ref 4.22–5.81)
RDW: 14 % (ref 11.5–15.5)
RDW: 14.1 % (ref 11.5–15.5)
RDW: 14.3 % (ref 11.5–15.5)
WBC: 16.6 10*3/uL — ABNORMAL HIGH (ref 4.0–10.5)
WBC: 20.1 10*3/uL — ABNORMAL HIGH (ref 4.0–10.5)
WBC: 24.7 10*3/uL — ABNORMAL HIGH (ref 4.0–10.5)

## 2014-03-23 LAB — HEPARIN LEVEL (UNFRACTIONATED)
HEPARIN UNFRACTIONATED: 0.13 [IU]/mL — AB (ref 0.30–0.70)
Heparin Unfractionated: 0.32 IU/mL (ref 0.30–0.70)
Heparin Unfractionated: 0.28 IU/mL — ABNORMAL LOW (ref 0.30–0.70)

## 2014-03-23 MED ORDER — MIDAZOLAM HCL 2 MG/2ML IJ SOLN
INTRAMUSCULAR | Status: AC
  Filled 2014-03-23: qty 2

## 2014-03-23 MED ORDER — DIPHENHYDRAMINE HCL 50 MG PO CAPS
50.0000 mg | ORAL_CAPSULE | Freq: Once | ORAL | Status: AC
  Administered 2014-03-24: 50 mg via ORAL
  Filled 2014-03-23: qty 1

## 2014-03-23 MED ORDER — PREDNISONE 50 MG PO TABS
50.0000 mg | ORAL_TABLET | Freq: Four times a day (QID) | ORAL | Status: AC
  Administered 2014-03-23 – 2014-03-24 (×3): 50 mg via ORAL
  Filled 2014-03-23 (×3): qty 1

## 2014-03-23 MED ORDER — FENTANYL CITRATE 0.05 MG/ML IJ SOLN
INTRAMUSCULAR | Status: AC
  Filled 2014-03-23: qty 2

## 2014-03-23 MED ORDER — SODIUM CHLORIDE 0.9 % IV SOLN
INTRAVENOUS | Status: AC | PRN
Start: 2014-03-23 — End: 2014-03-23
  Administered 2014-03-23: 30 mL/h via INTRAVENOUS

## 2014-03-23 MED ORDER — IOHEXOL 350 MG/ML SOLN
100.0000 mL | Freq: Once | INTRAVENOUS | Status: AC | PRN
  Administered 2014-03-23: 20 mL via INTRA_ARTERIAL

## 2014-03-23 MED ORDER — BENZONATATE 100 MG PO CAPS
100.0000 mg | ORAL_CAPSULE | Freq: Three times a day (TID) | ORAL | Status: DC | PRN
  Administered 2014-03-23: 100 mg via ORAL
  Filled 2014-03-23: qty 1

## 2014-03-23 NOTE — Progress Notes (Signed)
[facility] [place]  Subjective  - Some pain in foot last night, feels better today   Objective 144/72 97 97.6 F (36.4 C) (Oral) 37 87%  Intake/Output Summary (Last 24 hours) at 03/23/14 0805 Last data filed at 03/23/14 0700  Gross per 24 hour  Intake   1714 ml  Output   1475 ml  Net    239 ml   Left first toe dusky, PT doppler present marginal machine so difficult to tell much more than that.    Assessment/Planning: Follow up lysis study this morning  Jomaira Darr,[place] E 03/23/2014 8:05 AM --  Laboratory Lab Results:  Recent Labs  03/22/14 2052 03/23/14 0455  WBC 13.1* 16.6*  HGB 9.7* 9.0*  HCT 28.8* 27.1*  PLT 309 318   BMET  Recent Labs  03/21/14 1929 03/22/14 0331  NA 140 136*  K 4.2 4.5  CL 104 103  CO2 22 21  GLUCOSE 113* 194*  BUN 11 12  CREATININE 1.06 1.02  CALCIUM 9.2 9.0    COAG Lab Results  Component Value Date   INR 1.17 03/21/2014   No results found for this basename: PTT

## 2014-03-23 NOTE — Progress Notes (Addendum)
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 Prednisone Hives  . Omnipaque [Iohexol] Itching and Rash    Patient Measurements: Height: 5\' 4"  (162.6 cm) Weight: 131 lb 4.8 oz (59.557 kg) IBW/kg (Calculated) : 59.2 Heparin Dosing Weight: 59.5 kg  Vital Signs: Temp: 97.6 F (36.4 C) (08/01 1113) Temp src: Oral (08/01 1113) BP: 141/60 mmHg (08/01 1200) Pulse Rate: 102 (08/01 1200)  Labs:  Recent Labs  03/21/14 1929 03/22/14 0331  03/22/14 2052 03/23/14 0455 03/23/14 1145  HGB 10.3* 9.8*  < > 9.7* 9.0* 10.3*  HCT 31.2* 29.5*  < > 28.8* 27.1* 30.6*  PLT 347 323  < > 309 318 359  LABPROT 14.9  --   --   --   --   --   INR 1.17  --   --   --   --   --   HEPARINUNFRC  --  <0.10*  < > 0.10* 0.13* 0.32  CREATININE 1.06 1.02  --   --   --   --   < > = values in this interval not displayed.  Estimated Creatinine Clearance: 50 ml/min (by C-G formula based on Cr of 1.02).  Assessment:   Heparin level therapeutic (0.32) on 1500 units/hr at ~12noon today.  Noted distal segment of graft still occluded on check this am; for recheck on 8/2. TNKase just held due to bloody secretions, coughing up blood, as noted.  To reassess at 5pm.  Heparin drip to continue.  Goal of Therapy:  Heparin level 0.2-0.5 units/ml while on TNKase Monitor platelets by anticoagulation protocol: Yes   Plan:   Continue heparin at 1500 units/hr.  Continue q6hr heparin levels - next due at 6pm.  Daily CBC.  Will follow up for further bloody secretions and TNKase plans.  Dennie Fetters, [place] Pager: [phone] 03/23/2014,2:37 PM  Addendum:   Heparin level remains at goal (0.28) on 1500 units/hr.   TNKase resumed ~5pm at 1/2 dose (0.25 mg/hr), but off again at ~6pm due to recurrent hemoptysis.  RN reports no further hemoptysis since that time.   Dr. Bonnielee Haff to assess  at 10pm for possible restart of TNKase.   Next heparin level ~20mn.  Hilarie Fredrickson, RPh 03/23/2014 8:16 PM

## 2014-03-23 NOTE — Progress Notes (Signed)
Graft partially open but distal segment still occluded.  Plan is to lyse again tonight.  Pt has DP PT doppler right PT doppler left  Fabienne Bruns, MD [facility] [place] Office: [phone] Pager: [phone]

## 2014-03-23 NOTE — Progress Notes (Signed)
MD called back at 1700 as requested, Pt had only had one more bloody coughing episode, last being at 1500. MD Dr Bonnielee Haff ordered to resume TNK at half the dosage of 0.25mg /hr. Will cont. To monitor and assess

## 2014-03-23 NOTE — Progress Notes (Signed)
Dr Bonnielee Haff called again after turning back on the TNK pt had 2 episodes of coughing up blood. MD ordered to stop TNK again and to call him back at 2200 to let him know how the patient is doing. Will cont to monitor and assess patient.

## 2014-03-23 NOTE — Sedation Documentation (Signed)
Hand injections done by [employer], RT.  Dr Bonnielee Haff reviewing images.

## 2014-03-23 NOTE — Progress Notes (Signed)
eLink Physician-Brief Progress Note Patient Name: Trevor Morris DOB: [birth year] MRN: [id number]  Date of Service  03/23/2014   HPI/Events of Note   Scant hemoptysis with cough On TNKase Breathing comfortably  eICU Interventions  Add tessalon to help suppress cough Monitor resp status closely   Intervention Category Intermediate Interventions: Bleeding - evaluation and treatment with blood products  MCQUAID, DOUGLAS 03/23/2014, 4:27 PM

## 2014-03-23 NOTE — Progress Notes (Signed)
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   Assessment: 80yo male remains subtherapeutic on heparin with very little movement in level after rate change.  Goal of Therapy:  Heparin level 0.2-0.5 units/ml   Plan:  Will increase heparin gtt by ~3-4 units/kg/hr to 1500 units/hr and check level in 8hr vs f/u after IR removal of catheter.  Vernard Gambles, PharmD, BCPS  03/23/2014,5:52 AM

## 2014-03-23 NOTE — Progress Notes (Signed)
MD called to make aware of two episodes of blooding secretions with coughing. Dr Darrick Penna along with Dr Bonnielee Haff made aware. Dr Bonnielee Haff ordered to cut the TNK off and call him back at 1700. Will cont to monitor and assess patient

## 2014-03-23 NOTE — Sedation Documentation (Signed)
Dr Bonnielee Haff decision to run TNK over night and recheck tomorrow.  TNK resumed.

## 2014-03-24 ENCOUNTER — Inpatient Hospital Stay (HOSPITAL_COMMUNITY): Payer: Medicare Other

## 2014-03-24 DIAGNOSIS — R0602 Shortness of breath: Secondary | ICD-10-CM

## 2014-03-24 LAB — CBC
HCT: 24.4 % — ABNORMAL LOW (ref 39.0–52.0)
HCT: 25.5 % — ABNORMAL LOW (ref 39.0–52.0)
HEMATOCRIT: 25.1 % — AB (ref 39.0–52.0)
HEMOGLOBIN: 8.3 g/dL — AB (ref 13.0–17.0)
HEMOGLOBIN: 8.5 g/dL — AB (ref 13.0–17.0)
Hemoglobin: 8.2 g/dL — ABNORMAL LOW (ref 13.0–17.0)
MCH: 30 pg (ref 26.0–34.0)
MCH: 30.1 pg (ref 26.0–34.0)
MCH: 30.4 pg (ref 26.0–34.0)
MCHC: 33.1 g/dL (ref 30.0–36.0)
MCHC: 33.3 g/dL (ref 30.0–36.0)
MCHC: 33.6 g/dL (ref 30.0–36.0)
MCV: 90.4 fL (ref 78.0–100.0)
MCV: 90.4 fL (ref 78.0–100.0)
MCV: 90.6 fL (ref 78.0–100.0)
Platelets: 268 10*3/uL (ref 150–400)
Platelets: 279 10*3/uL (ref 150–400)
Platelets: 308 10*3/uL (ref 150–400)
RBC: 2.7 MIL/uL — ABNORMAL LOW (ref 4.22–5.81)
RBC: 2.77 MIL/uL — ABNORMAL LOW (ref 4.22–5.81)
RBC: 2.82 MIL/uL — ABNORMAL LOW (ref 4.22–5.81)
RDW: 14.4 % (ref 11.5–15.5)
RDW: 14.4 % (ref 11.5–15.5)
RDW: 14.5 % (ref 11.5–15.5)
WBC: 17.6 10*3/uL — ABNORMAL HIGH (ref 4.0–10.5)
WBC: 17.8 10*3/uL — AB (ref 4.0–10.5)
WBC: 19.3 10*3/uL — AB (ref 4.0–10.5)

## 2014-03-24 LAB — HEPARIN LEVEL (UNFRACTIONATED)
Heparin Unfractionated: 0.27 IU/mL — ABNORMAL LOW (ref 0.30–0.70)
Heparin Unfractionated: 0.33 IU/mL (ref 0.30–0.70)
Heparin Unfractionated: <0.1 IU/mL — ABNORMAL LOW (ref 0.30–0.70)

## 2014-03-24 LAB — TROPONIN I: Troponin I: <0.3 ng/mL (ref <0.30)

## 2014-03-24 LAB — GLUCOSE, CAPILLARY
GLUCOSE-CAPILLARY: 254 mg/dL — AB (ref 70–99)
GLUCOSE-CAPILLARY: 203 mg/dL — AB (ref 70–99)
Glucose-Capillary: 340 mg/dL — ABNORMAL HIGH (ref 70–99)
Glucose-Capillary: 292 mg/dL — ABNORMAL HIGH (ref 70–99)

## 2014-03-24 LAB — POCT I-STAT 3, ART BLOOD GAS (G3+)
Acid-base deficit: 2 mmol/L (ref 0.0–2.0)
Bicarbonate: 21.6 mEq/L (ref 20.0–24.0)
O2 Saturation: 98 %
PCO2 ART: 32.8 mmHg — AB (ref 35.0–45.0)
Patient temperature: 98.7
TCO2: 23 mmol/L (ref 0–100)
pH, Arterial: 7.427 (ref 7.350–7.450)
pO2, Arterial: 97 mmHg (ref 80.0–100.0)

## 2014-03-24 LAB — FIBRINOGEN
FIBRINOGEN: 400 mg/dL (ref 204–475)
Fibrinogen: 391 mg/dL (ref 204–475)
Fibrinogen: 407 mg/dL (ref 204–475)

## 2014-03-24 MED ORDER — FUROSEMIDE 10 MG/ML IJ SOLN
40.0000 mg | Freq: Once | INTRAMUSCULAR | Status: AC
  Administered 2014-03-24: 40 mg via INTRAVENOUS
  Filled 2014-03-24: qty 4

## 2014-03-24 MED ORDER — FENTANYL CITRATE 0.05 MG/ML IJ SOLN
INTRAMUSCULAR | Status: AC | PRN
  Administered 2014-03-24: 25 ug via INTRAVENOUS

## 2014-03-24 MED ORDER — SODIUM CHLORIDE 0.9 % IJ SOLN
10.0000 mL | Freq: Two times a day (BID) | INTRAMUSCULAR | Status: DC
  Administered 2014-03-24 – 2014-03-25 (×2): 10 mL
  Administered 2014-03-25 – 2014-03-26 (×2): 20 mL
  Administered 2014-03-26 – 2014-04-04 (×16): 10 mL

## 2014-03-24 MED ORDER — SODIUM CHLORIDE 0.9 % IV SOLN
INTRAVENOUS | Status: AC | PRN
  Administered 2014-03-24: 30 mL/h via INTRAVENOUS

## 2014-03-24 MED ORDER — IODIXANOL 320 MG/ML IV SOLN
100.0000 mL | Freq: Once | INTRAVENOUS | Status: AC | PRN
  Administered 2014-03-24: 50 mL via INTRAVENOUS

## 2014-03-24 MED ORDER — SODIUM CHLORIDE 0.9 % IJ SOLN
10.0000 mL | INTRAMUSCULAR | Status: DC | PRN
Start: 2014-03-24 — End: 2014-04-05
  Administered 2014-03-30 – 2014-04-04 (×4): 10 mL

## 2014-03-24 MED ORDER — ALBUTEROL SULFATE (2.5 MG/3ML) 0.083% IN NEBU
2.5000 mg | INHALATION_SOLUTION | Freq: Four times a day (QID) | RESPIRATORY_TRACT | Status: DC | PRN
  Administered 2014-03-24 – 2014-03-25 (×4): 2.5 mg via RESPIRATORY_TRACT
  Filled 2014-03-24 (×4): qty 3

## 2014-03-24 MED ORDER — MIDAZOLAM HCL 2 MG/2ML IJ SOLN
INTRAMUSCULAR | Status: AC
  Filled 2014-03-24: qty 2

## 2014-03-24 MED ORDER — HEPARIN SOD (PORK) LOCK FLUSH 100 UNIT/ML IV SOLN
INTRAVENOUS | Status: AC | PRN
  Administered 2014-03-24: 500 [IU] via INTRAVENOUS

## 2014-03-24 MED ORDER — ASPIRIN EC 325 MG PO TBEC
325.0000 mg | DELAYED_RELEASE_TABLET | Freq: Every day | ORAL | Status: DC
  Administered 2014-03-25 – 2014-04-05 (×12): 325 mg via ORAL
  Filled 2014-03-24 (×12): qty 1

## 2014-03-24 MED ORDER — FENTANYL CITRATE 0.05 MG/ML IJ SOLN
INTRAMUSCULAR | Status: AC
  Filled 2014-03-24: qty 2

## 2014-03-24 MED ORDER — MIDAZOLAM HCL 2 MG/2ML IJ SOLN
INTRAMUSCULAR | Status: AC | PRN
  Administered 2014-03-24: 0.5 mg via INTRAVENOUS

## 2014-03-24 NOTE — Progress Notes (Signed)
No complaints Foot feels ok Much stronger PT doppler today first toe less dusky TNK now off due to hemptysis Still on heparin Follow up angio this morning  Fabienne Bruns, MD [facility] [place] Office: [phone] Pager: [phone]

## 2014-03-24 NOTE — Sedation Documentation (Signed)
Doppler pulses X 2 both legs

## 2014-03-24 NOTE — Progress Notes (Signed)
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 Prednisone Hives  . Omnipaque [Iohexol] Itching and Rash    Patient Measurements: Height: 5\' 4"  (162.6 cm) Weight: 131 lb 4.8 oz (59.557 kg) IBW/kg (Calculated) : 59.2 Heparin Dosing Weight: 59.6 kg  Vital Signs: Temp: 98.2 F (36.8 C) (08/02 1932) Temp src: Oral (08/02 1932) BP: 135/114 mmHg (08/02 2000) Pulse Rate: 92 (08/02 2000)  Labs:  Recent Labs  03/22/14 0331  03/24/14 03/24/14 0548 03/24/14 1203 03/24/14 1430 03/24/14 1800 03/24/14 1841  HGB 9.8*  < > 8.5* 8.2* 8.3*  --   --   --   HCT 29.5*  < > 25.5* 24.4* 25.1*  --   --   --   PLT 323  < > 308 268 279  --   --   --   HEPARINUNFRC <0.10*  < > 0.27* 0.33 <0.10*  --  <0.10*  --   CREATININE 1.02  --   --   --   --   --   --   --   TROPONINI  --   --   --   --   --  <0.30  --  <0.30  < > = values in this interval not displayed.  Estimated Creatinine Clearance: 50 ml/min (by C-G formula based on Cr of 1.02).  Assessment:   Heparin level therapeutic (0.33) at ~6am this morning on 1500 units/hr. TNKase off ~5am today due to recurrent hemoptysis. Heparin drip off when back to IR this am; thrombus resovled, angioplasty of 2 areas done. No further TNKase.   Heparin drip resumed ~11:30am at pre-procedure rate of 1500 units/hr.  Heparin level ~6pm tonight was < 0.1. No interruptions of infusion.  Level repeated ~8pm and same results obtained.    Goal of Therapy:  Heparin level 0.3-0.7 units/ml Monitor platelets by anticoagulation protocol: Yes   Plan:   Increase heparin drip to 1700 units/hr.  Next heparin level ~ 6 hours after increase.  Daily heparin level and CBC.  Will follow up for any bleeding.  Dennie Fetters, [place] Pager: [phone] 03/24/2014,9:03 PM

## 2014-03-24 NOTE — Progress Notes (Signed)
Pt noted to be having recurrent issues of hemoptysis increasing since restarting TNK; ay first the expectorant appeared dark old blood mixed in sputum; as the time progressed the expectorant became more brighter blood in appearance though still not a significant amount. Dr Bonnielee Haff contacted and new findings were reviewed, orders received to stop TNK infusion and restart NS at 10cc/hr via sheath. Will cont' to monitor

## 2014-03-24 NOTE — Progress Notes (Signed)
Shortness of breath coming back from IR. On non-breather mask. ABGs normal. Left sided rales on auscultation. Lasix ordered. CXR pending.  [name], PA-C Vascular Surgery  [phone]

## 2014-03-24 NOTE — Sedation Documentation (Addendum)
Arrival to IR moved to table with mx assist.  O2 sat 89 on 8L/[place], changed to 100% non rebreather, sat up to 100% Heparin stopped

## 2014-03-24 NOTE — Progress Notes (Signed)
Peripherally Inserted Central Catheter/Midline Placement  The IV Nurse has discussed with the patient and/or persons authorized to consent for the patient, the purpose of this procedure and the potential benefits and risks involved with this procedure.  The benefits include less needle sticks, lab draws from the catheter and patient may be discharged home with the catheter.  Risks include, but not limited to, infection, bleeding, blood clot (thrombus formation), and puncture of an artery; nerve damage and irregular heat beat.  Alternatives to this procedure were also discussed.  PICC/Midline Placement Documentation  PICC / Midline Double Lumen 03/24/14 PICC Right Basilic 38 cm 3 cm (Active)  Indication for Insertion or Continuance of Line Limited venous access - need for IV therapy >5 days (PICC only);Prolonged intravenous therapies 03/24/2014  5:13 PM  Exposed Catheter (cm) 3 cm 03/24/2014  5:13 PM  Site Assessment Clean;Dry;Intact 03/24/2014  5:13 PM  Lumen #1 Status Flushed;Saline locked;Blood return noted 03/24/2014  5:13 PM  Lumen #2 Status Flushed;Saline locked;Blood return noted 03/24/2014  5:13 PM  Dressing Type Gauze 03/24/2014  5:13 PM  Dressing Status Clean;Dry;Intact;Antimicrobial disc in place 03/24/2014  5:13 PM  Line Care Connections checked and tightened 03/24/2014  5:13 PM  Line Adjustment (NICU/IV Team Only) No 03/24/2014  5:13 PM  Dressing Intervention New dressing 03/24/2014  5:13 PM  Dressing Change Due 03/26/14 03/24/2014  5:13 PM       Elliot Dally 03/24/2014, 5:14 PM

## 2014-03-24 NOTE — Progress Notes (Signed)
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   Assessment/Plan:  80yo male remains therapeutic on heparin. Will continue gtt at current rate and continue to monitor.   Vernard Gambles, PharmD, BCPS  03/24/2014,2:19 AM

## 2014-03-24 NOTE — Sedation Documentation (Signed)
6Fr Exoseal closure by [facility], RT

## 2014-03-24 NOTE — Progress Notes (Signed)
Pt with some shortness of breath earlier today.  This has become worse on return from Radiology.  One dose of lasix was ordered.  He is currently on a non rebreather. +approx 800 cc of last few days  ABG PO2 97, HCO3 22, CO2 33, 7.4  Respiratory alkalosis ? Compensatory from hypoxia Doubt PE with thrombolytics running over last 72 hours.  Did have hemoptysis last 24 hours Will check 12 lead EKG and enzymes  IF cardiac workup neg and still pulm issues will consider PE CT but pt is on therapeutic heparin already    Fabienne Bruns, MD [facility] [place] Office: [phone] Pager: [phone]

## 2014-03-24 NOTE — Progress Notes (Signed)
Pt returned from IR on NRB mask. Transitioned to nasal cannula at 6L became sob during lunch, MD paged orders received.

## 2014-03-24 NOTE — Sedation Documentation (Signed)
Dr [facility] updating Dr Darrick Penna on findings

## 2014-03-24 NOTE — Procedures (Addendum)
LLE arterial lysis and PTA  Post lysis, the vein graft thrombus has resolved with 1)distal anastomosis and residual 2)tibioperoneal trunk stenosis.  Int -  PTA 4 mm with succes. 2 vess RO via PT and peroneal artery  No comp

## 2014-03-24 NOTE — Sedation Documentation (Signed)
No CO2 monitoring due to non rebreather mask

## 2014-03-24 NOTE — Sedation Documentation (Signed)
Heparin gtt stopped.

## 2014-03-24 NOTE — Sedation Documentation (Signed)
Heparin Gtt resumed at 1500u/hr

## 2014-03-25 ENCOUNTER — Encounter (HOSPITAL_COMMUNITY): Payer: Self-pay | Admitting: Radiology

## 2014-03-25 ENCOUNTER — Inpatient Hospital Stay (HOSPITAL_COMMUNITY): Payer: Medicare Other

## 2014-03-25 DIAGNOSIS — Z01810 Encounter for preprocedural cardiovascular examination: Secondary | ICD-10-CM

## 2014-03-25 DIAGNOSIS — J8410 Pulmonary fibrosis, unspecified: Secondary | ICD-10-CM

## 2014-03-25 DIAGNOSIS — J438 Other emphysema: Secondary | ICD-10-CM

## 2014-03-25 DIAGNOSIS — J9600 Acute respiratory failure, unspecified whether with hypoxia or hypercapnia: Secondary | ICD-10-CM

## 2014-03-25 LAB — BLOOD GAS, ARTERIAL
ACID-BASE DEFICIT: 1.7 mmol/L (ref 0.0–2.0)
Bicarbonate: 22.1 mEq/L (ref 20.0–24.0)
DRAWN BY: 27733
FIO2: 0.8 %
O2 Saturation: 92.3 %
PATIENT TEMPERATURE: 99.9
PCO2 ART: 36.3 mmHg (ref 35.0–45.0)
TCO2: 23.2 mmol/L (ref 0–100)
pH, Arterial: 7.407 (ref 7.350–7.450)
pO2, Arterial: 67.8 mmHg — ABNORMAL LOW (ref 80.0–100.0)

## 2014-03-25 LAB — BASIC METABOLIC PANEL
Anion gap: 13 (ref 5–15)
BUN: 26 mg/dL — AB (ref 6–23)
CHLORIDE: 100 meq/L (ref 96–112)
CO2: 24 meq/L (ref 19–32)
Calcium: 8.4 mg/dL (ref 8.4–10.5)
Creatinine, Ser: 1 mg/dL (ref 0.50–1.35)
GFR calc Af Amer: 81 mL/min — ABNORMAL LOW (ref >90)
GFR calc non Af Amer: 70 mL/min — ABNORMAL LOW (ref >90)
Glucose, Bld: 222 mg/dL — ABNORMAL HIGH (ref 70–99)
Potassium: 4.4 mEq/L (ref 3.7–5.3)
Sodium: 137 mEq/L (ref 137–147)

## 2014-03-25 LAB — FIBRINOGEN: Fibrinogen: 491 mg/dL — ABNORMAL HIGH (ref 204–475)

## 2014-03-25 LAB — GLUCOSE, CAPILLARY
GLUCOSE-CAPILLARY: 234 mg/dL — AB (ref 70–99)
GLUCOSE-CAPILLARY: 309 mg/dL — AB (ref 70–99)
GLUCOSE-CAPILLARY: 151 mg/dL — AB (ref 70–99)
GLUCOSE-CAPILLARY: 119 mg/dL — AB (ref 70–99)
GLUCOSE-CAPILLARY: 85 mg/dL (ref 70–99)
Glucose-Capillary: 124 mg/dL — ABNORMAL HIGH (ref 70–99)
Glucose-Capillary: 125 mg/dL — ABNORMAL HIGH (ref 70–99)
Glucose-Capillary: 151 mg/dL — ABNORMAL HIGH (ref 70–99)
Glucose-Capillary: 198 mg/dL — ABNORMAL HIGH (ref 70–99)
Glucose-Capillary: 281 mg/dL — ABNORMAL HIGH (ref 70–99)
Glucose-Capillary: 295 mg/dL — ABNORMAL HIGH (ref 70–99)

## 2014-03-25 LAB — CBC
HEMATOCRIT: 24 % — AB (ref 39.0–52.0)
HEMOGLOBIN: 7.9 g/dL — AB (ref 13.0–17.0)
MCH: 30 pg (ref 26.0–34.0)
MCHC: 32.9 g/dL (ref 30.0–36.0)
MCV: 91.3 fL (ref 78.0–100.0)
Platelets: 270 10*3/uL (ref 150–400)
RBC: 2.63 MIL/uL — ABNORMAL LOW (ref 4.22–5.81)
RDW: 14.7 % (ref 11.5–15.5)
WBC: 17.6 10*3/uL — ABNORMAL HIGH (ref 4.0–10.5)

## 2014-03-25 LAB — SEDIMENTATION RATE: Sed Rate: 60 mm/hr — ABNORMAL HIGH (ref 0–16)

## 2014-03-25 LAB — TROPONIN I
TROPONIN I: 0.63 ng/mL — AB (ref <0.30)
Troponin I: <0.3 ng/mL (ref <0.30)
Troponin I: 0.94 ng/mL (ref <0.30)

## 2014-03-25 LAB — HEPARIN LEVEL (UNFRACTIONATED): HEPARIN UNFRACTIONATED: 0.39 [IU]/mL (ref 0.30–0.70)

## 2014-03-25 LAB — PROCALCITONIN: PROCALCITONIN: 0.5 ng/mL

## 2014-03-25 LAB — PRO B NATRIURETIC PEPTIDE: Pro B Natriuretic peptide (BNP): 9493 pg/mL — ABNORMAL HIGH (ref 0–450)

## 2014-03-25 MED ORDER — PIPERACILLIN-TAZOBACTAM 3.375 G IVPB
3.3750 g | Freq: Three times a day (TID) | INTRAVENOUS | Status: DC
  Administered 2014-03-25 – 2014-03-26 (×3): 3.375 g via INTRAVENOUS
  Filled 2014-03-25 (×5): qty 50

## 2014-03-25 MED ORDER — SODIUM CHLORIDE 0.9 % IV SOLN
250.0000 mg | Freq: Four times a day (QID) | INTRAVENOUS | Status: DC
  Administered 2014-03-25 – 2014-03-26 (×4): 250 mg via INTRAVENOUS
  Filled 2014-03-25 (×7): qty 2

## 2014-03-25 MED ORDER — DIPHENHYDRAMINE HCL 50 MG/ML IJ SOLN
12.5000 mg | Freq: Four times a day (QID) | INTRAMUSCULAR | Status: DC | PRN
  Administered 2014-03-26 – 2014-03-27 (×3): 12.5 mg via INTRAVENOUS
  Filled 2014-03-25 (×3): qty 1

## 2014-03-25 MED ORDER — VANCOMYCIN HCL 500 MG IV SOLR
500.0000 mg | Freq: Two times a day (BID) | INTRAVENOUS | Status: DC
  Administered 2014-03-26 – 2014-03-28 (×5): 500 mg via INTRAVENOUS
  Filled 2014-03-25 (×6): qty 500

## 2014-03-25 MED ORDER — LEVOFLOXACIN IN D5W 750 MG/150ML IV SOLN
750.0000 mg | INTRAVENOUS | Status: DC
  Administered 2014-03-25 – 2014-03-26 (×2): 750 mg via INTRAVENOUS
  Filled 2014-03-25 (×3): qty 150

## 2014-03-25 MED ORDER — INSULIN ASPART 100 UNIT/ML ~~LOC~~ SOLN
0.0000 [IU] | Freq: Three times a day (TID) | SUBCUTANEOUS | Status: DC
  Administered 2014-03-25: 3 [IU] via SUBCUTANEOUS

## 2014-03-25 MED ORDER — SODIUM CHLORIDE 0.9 % IV SOLN
INTRAVENOUS | Status: DC
  Administered 2014-03-25: 2.4 [IU]/h via INTRAVENOUS
  Filled 2014-03-25 (×2): qty 1

## 2014-03-25 MED ORDER — VANCOMYCIN HCL IN DEXTROSE 1-5 GM/200ML-% IV SOLN
1000.0000 mg | Freq: Once | INTRAVENOUS | Status: AC
  Administered 2014-03-25: 1000 mg via INTRAVENOUS
  Filled 2014-03-25: qty 200

## 2014-03-25 NOTE — Consult Note (Signed)
PULMONARY / CRITICAL CARE MEDICINE  Name: Trevor Morris MRN: [id number] DOB: 07-11-1934    ADMISSION DATE:  03/21/2014 CONSULTATION DATE: Caroleen Hamman  REFERRING MD :  Oneida Alar   CHIEF COMPLAINT:  Acute hypoxic respiratory failure   INITIAL PRESENTATION:  80 year old male w/ known h/o CM allergy. Underwent angiography and TNK administration for lysis of distal clot at his by-pass graft site on 7/31. His post-op course was complicated by episodes of scant hemoptysis. First noted 8/1 and continued so TNK was eventually stopped. He went to arteriogram 3 times total between 7/31 and 8/2. On 8/2 he began to have significant increase in dyspnea and by the time he returned to the SICU after angiogram required 100% NRB. He received lasix and supportive care. PCCM asked to see 8/3 as symptoms had continued to worsen.   STUDIES:  8/31: arteriogram: Pelvic angiography demonstrates atherosclerotic plaque at the origin  of the right and left common iliac arteries without narrowing greater than 50%. The left external iliac artery is patent. Successful placement of an infusion catheter into a thrombosed left femoral popliteal artery bypass graft. TNK was instituted at 0.5 milligrams/hour 8/1: arteriogram: improvement with near complete lysed cysts of the saphenous vein graft. There continues to be occlusive thrombus distally. The plan is for an additional 24 hr of lysis with a follow-up check 8/2 8/2 third trip to angiogram: demonstrates resolution of the thrombus throughout the fem-pop venous bypass graft but 2 areas of narrowing distally, at the distal anastomosis, and within the tibioperoneal trunk.   SIGNIFICANT EVENTS: 7/30 admitted for clotted fem-pop bypass graft.  7/31: went to IR for angiogram and TNK.  8/1, 230 p: TNK stopped due to scant hemoptysis. Resumed at 5p. 1/2 dose  8/1 630: hemoptysis returned and was not resumed. Heparin continued.  8/2: increased shortness of breath.  8/2: placed on 100%  NRB after returning from angiogram.  8/3 PCCM called. Still very hypoxic on 100% NRB. Had episode where sats dropped to 70s. Heparin stopped due to some episode of hemoptysis.   HISTORY OF PRESENT ILLNESS:     80 y.o.m  who presented to the [facility] with chief complaint: left leg pain since 03/20/14. He previously had a left femoral to below knee popliteal bypass with saphenous vein graft in 1999 by Dr. Kellie Simmering. He also had angioplasty of the popliteal artery and tibioperoneal trunk in 2009 by Dr. Trula Slade. His pain was localized to his left lower leg and occurs at night. He was not ambulatory due to COPD symptoms. He also noted "blue" changes to his left great toe. He denied any problems with his right leg. He was admitted on 7/30 for further evaluation. Duplex showed near occlusion at distal anastomosis site w/ possible thrombus. He was placed on heparin gtt, w/ plan for IR aortagram w/ runoff and thrombolysis if clot. He was started on prednisone and benadryl at time of admission for known CM allergy. He initially went to IR on 7/31. Arterial cath was placed. Arteriogram showed atherosclerotic plaque at the origin  of the right and left common iliac arteries without narrowing greater than 50%. TNK was instituted at 0.5  Milligrams/hour, he was sent back to the SICU for monitoring. The am of 8/1 his foot pain had improved as had his vascular exam. He went back to angiogram, this showed some improvement but still had clot distal so plan was to continue another 24 h of lysis. That evening he began to have some episodes of scant  hemoptysis. TNK was stopped that afternoon, it was resumed after 3 hrs at 1/2 dose. W/in an hour he began coughing up blood again. TNK was again stopped, he was continued on heparin. On 8/2 he began to have increased dyspnea. When he returned from his third trip to IR which showed: Initial angiogram demonstrates resolution of the thrombus throughout the fem-pop venous bypass graft but 2 areas  of narrowing distally, at the distal anastomosis, and within the tibioperoneal trunk. He was titrated from 6 liters to 100% NRB. Got lasix. On 8/3 had acute episode where O2 sats dropped to 70s when 100% NRB removed. Still having episodes of hemoptysis. Heparin stopped. PCCM asked to see.   Of note pt stopped smoking over 27 years ago. Does have recent h/o dysphagia and cough w/ po intake as well as morning cough. Per his son he had an EGD that did not show stricture. Not clear what other work-up he underwent... He states his activity had never been limited by dyspnea until ~ 2 months ago when he was diagnosed w/ pneumonia. He felt he never returned to baseline breathing after that. Of note he had a CT of abd/pelvis that back on 6/30 that showed bibasilar R>L airspace disease & his presenting CXR showed R>L pulmonary infiltrates, suggesting a chronic process.   PAST MEDICAL HISTORY :  Past Medical History  Diagnosis Date  . Diabetes mellitus   . Hyperlipidemia   . Hypertension   . Anemia   . COPD (chronic obstructive pulmonary disease)   . Peripheral vascular disease    Past Surgical History  Procedure Laterality Date  . Pr vein bypass graft,aorto-fem-pop     Prior to Admission medications   Medication Sig Start Date End Date Taking? Authorizing Provider  aspirin EC 81 MG tablet Take 81 mg by mouth daily.     Yes Historical Provider, MD  lisinopril (PRINIVIL,ZESTRIL) 40 MG tablet Take 40 mg by mouth daily.     Yes Historical Provider, MD  metFORMIN (GLUCOPHAGE) 1000 MG tablet Take 1,000 mg by mouth 2 (two) times daily with a meal.   Yes Historical Provider, MD  Multiple Vitamin (MULTIVITAMIN WITH MINERALS) TABS tablet Take 1 tablet by mouth daily.   Yes Historical Provider, MD  rosuvastatin (CRESTOR) 40 MG tablet Take 40 mg by mouth daily.     Yes Historical Provider, MD   Allergies  Allergen Reactions  . Prednisone Hives  . Omnipaque [Iohexol] Itching and Rash   FAMILY HISTORY:   Family History  Problem Relation Age of Onset  . Diabetes Other   . Diabetes Father   . Hypertension Father    SOCIAL HISTORY:  reports that he quit smoking about 26 years ago. His smoking use included Cigarettes. He smoked 0.00 packs per day. He has never used smokeless tobacco. He reports that he does not drink alcohol or use illicit drugs.  Review of Systems:   Bolds are positive  Constitutional: weight loss, gain, night sweats, Fevers, chills, fatigue .  HEENT: headaches, Sore throat, sneezing, nasal congestion, post nasal drip, Difficulty swallowing, Tooth/dental problems, visual complaints visual changes, ear ache CV:  chest pain, radiates: ,Orthopnea, PND, swelling in lower extremities, dizziness, palpitations, syncope.  GI  heartburn, indigestion, abdominal pain, nausea, vomiting, diarrhea, change in bowel habits, loss of appetite, bloody stools.  Resp: cough, productive: , scant hemoptysis, dyspnea, chest pain, pleuritic. Denies exposure to dusts, smokes or fumes. No pet dander. No h/o RA or auto-immune disease. No mold exposure. No  travel.   Skin: rash or itching or icterus GU: dysuria, change in color of urine, urgency or frequency. flank pain, hematuria  MS: joint pain or swelling. decreased range of motion  Psych: change in mood or affect. depression or anxiety.  Neuro: difficulty with speech, weakness, numbness, ataxia   SUBJECTIVE:   VITAL SIGNS: Temp:  [97.5 F (36.4 C)-99.9 F (37.7 C)] 99.9 F (37.7 C) (08/03 0729) Pulse Rate:  [84-118] 118 (08/03 0800) Resp:  [20-41] 35 (08/03 0800) BP: (110-148)/(43-114) 143/74 mmHg (08/03 0800) SpO2:  [91 %-100 %] 98 % (08/03 0800) 100%   HEMODYNAMICS:   VENTILATOR SETTINGS:   INTAKE / OUTPUT:  Intake/Output Summary (Last 24 hours) at 03/25/14 1020 Last data filed at 03/25/14 0700  Gross per 24 hour  Intake   1647 ml  Output   1800 ml  Net   -153 ml    PHYSICAL EXAMINATION: General:  80 year old male, currently  on 100% NRB Neuro:  Awake, oriented no focal def  HEENT:  [place], no JVD Cardiovascular:  Tachy rrr w/ ST depression  Lungs:  Fine rales  Abdomen:  Soft, non-tender  Musculoskeletal:  Intact  Skin:  Intact   LABS:  CBC  Recent Labs Lab 03/24/14 0548 03/24/14 1203 03/25/14 0249  WBC 17.6* 17.8* 17.6*  HGB 8.2* 8.3* 7.9*  HCT 24.4* 25.1* 24.0*  PLT 268 279 270   Coag's  Recent Labs Lab 03/21/14 1929  INR 1.17   BMET  Recent Labs Lab 03/21/14 1929 03/22/14 0331 03/25/14 0249  NA 140 136* 137  K 4.2 4.5 4.4  CL 104 103 100  CO2 22 21 24   BUN 11 12 26*  CREATININE 1.06 1.02 1.00  GLUCOSE 113* 194* 222*   Electrolytes  Recent Labs Lab 03/21/14 1929 03/22/14 0331 03/25/14 0249  CALCIUM 9.2 9.0 8.4   Sepsis Markers No results found for this basename: LATICACIDVEN, PROCALCITON, O2SATVEN,  in the last 168 hours  ABG  Recent Labs Lab 03/24/14 1306 03/25/14 0830  PHART 7.427 7.407  PCO2ART 32.8* 36.3  PO2ART 97.0 67.8*   Liver Enzymes  Recent Labs Lab 03/21/14 1929  AST 20  ALT 16  ALKPHOS 88  BILITOT 0.3  ALBUMIN 3.2*   Cardiac Enzymes  Recent Labs Lab 03/24/14 1430 03/24/14 1841 03/25/14 0249  TROPONINI <0.30 <0.30 <0.30   Glucose  Recent Labs Lab 03/23/14 2138 03/24/14 0729 03/24/14 1210 03/24/14 1739 03/24/14 2117 03/25/14 0725  GLUCAP 237* 254* 203* 340* 292* 234*   Imaging Ir Fem Pop Art Pta Mod Sed  03/24/2014   CLINICAL DATA:  48 hr post left lower extremity fem-pop graft lysis.  EXAM: IR THROMB F/U EVAL ART/VEN FINAL DAY; IR FEM POP ART PTA  FLUOROSCOPY TIME:  17 min and 21 seconds.  MEDICATIONS AND MEDICAL HISTORY: Versed 0.5 mg, Fentanyl 25 mcg.  Additional Medications: Heparin 500 units.  ANESTHESIA/SEDATION: Moderate sedation time: 85 minutes  CONTRAST:  50 cc Visipaque 320  PROCEDURE: The procedure, risks, benefits, and alternatives were explained to the patient. Questions regarding the procedure were encouraged and  answered. The patient understands and consents to the procedure.  The right groin was prepped with Betadine in a sterile fashion, and a sterile drape was applied covering the operative field. A sterile gown and sterile gloves were used for the procedure.  Contrast was injected into the multi side-hole catheter and a right lower extremity runoff was performed. This demonstrated resolution of the thrombus with residual narrowing at  the distal graft to tibioperoneal trunk anastomosis as well as a distal tibioperoneal trunk stenosis. The heparin was stopped during the procedure. After approximately 20 min, repeat angiogram demonstrates recurrent early occlusion at these areas of narrowing. 500 units of additional heparin was administered.  The infusion catheter was exchanged over a 300 cm stiff a 018 wire for a 4 mm x 2 cm balloon.  A glidewire was advanced through the balloon and across the 2 lesions into the peroneal artery. The glidewire was exchanged for the stiff 018 wire. The 2 lesions were dilated.  Post angioplasty imaging with angiography demonstrates a dissection in the tibioperoneal trunk narrowing location. This area was read dilated and held in place for 60 seconds. Repeat angiogram demonstrates wide patency at the 2 areas of stenosis.  A full left lower extremity runoff demonstrates patency of the left common femoral artery, bypass graft, tibioperoneal trunk, and with 2 vessel runoff via the posterior tibial and peroneal arteries.  The sheath was removed and hemostasis was achieved with an exo seal device.  FINDINGS: Initial angiogram demonstrates resolution of the thrombus throughout the fem-pop venous bypass graft but 2 areas of narrowing distally, at the distal anastomosis, and within the tibioperoneal trunk.  After angioplasty, initial repeat imaging demonstrates a dissection at the tibioperoneal trunk stenosis.  After repeat angioplasty, the vessel was noted to be widely patent with some irregularity.   Left lower extremity runoff was performed demonstrating patency of the graft, distal anastomosis to the tibioperoneal trunk, and 2 vessel runoff via the posterior tibial artery and peroneal artery.  COMPLICATIONS: None  IMPRESSION: Successful lysis of a femoral popliteal bypass graft and successful angioplasty of 2 areas of infrapopliteal narrowing as described.   Electronically Signed   By: Maryclare Bean M.D.   On: 03/24/2014 12:07   Dg Chest Port 1 View  03/24/2014   CLINICAL DATA:  Short of breath, coughing congestion  EXAM: PORTABLE CHEST - 1 VIEW  COMPARISON:  Prior chest x-ray 03/24/2014 at 3:28 a.m.  FINDINGS: Persistent diffuse bilateral predominantly interstitial opacities. There has been no significant interval change compared to the radiograph from earlier this morning. Cardiac and mediastinal contours remain unchanged. Atherosclerotic calcifications are present within the transverse aorta. Upper abdominal gas pattern is unremarkable. No acute osseous abnormality.  IMPRESSION: No significant interval change in the appearance of the lungs. Persistent diffuse bilateral interstitial opacities which may reflect pulmonary fibrosis, interstitial edema or atypical infection.   Electronically Signed   By: Jacqulynn Cadet M.D.   On: 03/24/2014 14:40   Dg Chest Port 1 View  03/24/2014   CLINICAL DATA:  Hemoptysis and wheezing.  EXAM: PORTABLE CHEST - 1 VIEW  COMPARISON:  03/21/2014  FINDINGS: Borderline heart size. Diffuse coarse interstitial infiltrates throughout both lungs likely representing diffuse interstitial fibrosis. No significant change since previous study, allowing for technical differences. No focal consolidation. No blunting of costophrenic angles. No pneumothorax.  IMPRESSION: Diffuse interstitial infiltrates in the lungs likely fibrosis. No change since prior study.   Electronically Signed   By: Lucienne Capers M.D.   On: 03/24/2014 04:05   Ir Jacolyn Reedy F/u Eval Art/ven Final Day  (ms)  03/24/2014   CLINICAL DATA:  48 hr post left lower extremity fem-pop graft lysis.  EXAM: IR THROMB F/U EVAL ART/VEN FINAL DAY; IR FEM POP ART PTA  FLUOROSCOPY TIME:  17 min and 21 seconds.  MEDICATIONS AND MEDICAL HISTORY: Versed 0.5 mg, Fentanyl 25 mcg.  Additional Medications: Heparin 500 units.  ANESTHESIA/SEDATION: Moderate sedation time: 85 minutes  CONTRAST:  50 cc Visipaque 320  PROCEDURE: The procedure, risks, benefits, and alternatives were explained to the patient. Questions regarding the procedure were encouraged and answered. The patient understands and consents to the procedure.  The right groin was prepped with Betadine in a sterile fashion, and a sterile drape was applied covering the operative field. A sterile gown and sterile gloves were used for the procedure.  Contrast was injected into the multi side-hole catheter and a right lower extremity runoff was performed. This demonstrated resolution of the thrombus with residual narrowing at the distal graft to tibioperoneal trunk anastomosis as well as a distal tibioperoneal trunk stenosis. The heparin was stopped during the procedure. After approximately 20 min, repeat angiogram demonstrates recurrent early occlusion at these areas of narrowing. 500 units of additional heparin was administered.  The infusion catheter was exchanged over a 300 cm stiff a 018 wire for a 4 mm x 2 cm balloon.  A glidewire was advanced through the balloon and across the 2 lesions into the peroneal artery. The glidewire was exchanged for the stiff 018 wire. The 2 lesions were dilated.  Post angioplasty imaging with angiography demonstrates a dissection in the tibioperoneal trunk narrowing location. This area was read dilated and held in place for 60 seconds. Repeat angiogram demonstrates wide patency at the 2 areas of stenosis.  A full left lower extremity runoff demonstrates patency of the left common femoral artery, bypass graft, tibioperoneal trunk, and with 2 vessel  runoff via the posterior tibial and peroneal arteries.  The sheath was removed and hemostasis was achieved with an exo seal device.  FINDINGS: Initial angiogram demonstrates resolution of the thrombus throughout the fem-pop venous bypass graft but 2 areas of narrowing distally, at the distal anastomosis, and within the tibioperoneal trunk.  After angioplasty, initial repeat imaging demonstrates a dissection at the tibioperoneal trunk stenosis.  After repeat angioplasty, the vessel was noted to be widely patent with some irregularity.  Left lower extremity runoff was performed demonstrating patency of the graft, distal anastomosis to the tibioperoneal trunk, and 2 vessel runoff via the posterior tibial artery and peroneal artery.  COMPLICATIONS: None  IMPRESSION: Successful lysis of a femoral popliteal bypass graft and successful angioplasty of 2 areas of infrapopliteal narrowing as described.   Electronically Signed   By: Maryclare Bean M.D.   On: 03/24/2014 12:07   ASSESSMENT / PLAN:  Acute hypoxemic respiratory failure  Suspect baseline ILD, now with accelerating course, not clear what the precipitating factor is  Possible pneumonia, aspiration?, radiographic pattern is more consistent with atypical / viral / pneumonitis ARDS? Doubt pulmonary edema, patient is clinically dry Possible COPD   Goal SpO2>92  Supplemental oxygen  May consider BiPAP, but must be carefull about possible aspiration  Discussed with son / patient >>> they are agreeable to short term ventilatory support if required  Would not be able to tolerate FOB unless intubated - would defer for now; if intubated would BAL and send cultures, viral panel and cell count  Agree with ECHO, CVP and Lasix trial though doubt it is cardiogenic in nature  Broaden abx to cover aspiration and atypicals ( Zosyn, Vancomycin, Levaquin )  Check ESR  Dedicated chest CT to better evaluate parenchyma / esophageal anatomy  Pulse dose steroids ( Solu-Medrol  250 mg IV q6h x 3 day )  Bronchodilators PRN  When improved needs swallowing study / esophagram  Thank you for interesting consult, will follow  I have personally obtained history, examined patient, evaluated and interpreted laboratory and imaging results, reviewed medical records, formulated assessment / plan and placed orders.  Doree Fudge, MD Pulmonary and [place] Pager: [phone]   03/25/2014, 7:26 PM

## 2014-03-25 NOTE — Plan of Care (Signed)
Problem: Phase II Progression Outcomes Goal: 02 sats trending upward/stable (PE) Outcome: Not Progressing Currently on PRB 60-80% Goal: Discharge plan established Outcome: Completed/Met Date Met:  03/25/14 To discharge home with family.  Patient is primary caregiver of wife.  Will likely need HHRN upon discharge vs. SNF if gets deconditioned

## 2014-03-25 NOTE — Progress Notes (Signed)
CRITICAL VALUE ALERT  Critical value received:Troponin 0.63  Date of notification:  03/25/2014  Time of notification:  1845  Critical value read back:Yes.     Nurse who received alert:  Edilia Bo, RN  MD notified (1st page):  Dr. Vassie Loll in box  Time of first page:  1845  MD notified (2nd page):  Time of second page:  Responding MD:  Dr. Vassie Loll  Time MD responded:  Made aware

## 2014-03-25 NOTE — Progress Notes (Signed)
ANTICOAGULATION CONSULT NOTE - Follow Up Consult  Pharmacy Consult for heparin Indication: acute occlusion of left femoral-below-knee popliteal bypass  Labs:  Recent Labs  03/24/14 0548 03/24/14 1203 03/24/14 1430 03/24/14 1800 03/24/14 1841 03/25/14 0249  HGB 8.2* 8.3*  --   --   --  7.9*  HCT 24.4* 25.1*  --   --   --  24.0*  PLT 268 279  --   --   --  270  HEPARINUNFRC 0.33 <0.10*  --  <0.10*  --  0.39  CREATININE  --   --   --   --   --  1.00  TROPONINI  --   --  <0.30  --  <0.30 <0.30    Assessment/Plan:  80yo male now therapeutic on heparin after rate increase. Will continue gtt at current rate and confirm stable with additional level.   Vernard Gambles, PharmD, BCPS  03/25/2014,4:23 AM

## 2014-03-25 NOTE — Progress Notes (Signed)
Patient using urinal.  Started coughing and took O2 mask off.  Patient immediately decompensated and O2 sats dropped to 70%.  Patient diaphoretic and had brief loss of consciousness.  Respiratory therapy immediately notified.  Breathing treatment given to patient and encouraged patient to try and relax.  Room cooled down and cold washcloth to head.  PA for [facility] in to see patient.  Made aware of above.  Dr. Hart Rochester coming to see patient as well.  Lungs clear.  ABG ordered.

## 2014-03-25 NOTE — Progress Notes (Addendum)
ANTIBIOTIC CONSULT NOTE - INITIAL  Pharmacy Consult for Zosyn Indication: pneumonia  Allergies  Allergen Reactions  . Prednisone Hives  . Omnipaque [Iohexol] Itching and Rash    Patient Measurements: Height: 5\' 4"  (162.6 cm) Weight: 131 lb 4.8 oz (59.557 kg) IBW/kg (Calculated) : 59.2  Vital Signs: Temp: 99.9 F (37.7 C) (08/03 0729) Temp src: Oral (08/03 0729) BP: 143/74 mmHg (08/03 0800) Pulse Rate: 118 (08/03 0800) Intake/Output from previous day: 08/02 0701 - 08/03 0700 In: 1657 [P.O.:1285; I.V.:372] Out: 1800 [Urine:1800] Intake/Output from this shift:    Labs:  Recent Labs  03/24/14 0548 03/24/14 1203 03/25/14 0249  WBC 17.6* 17.8* 17.6*  HGB 8.2* 8.3* 7.9*  PLT 268 279 270  CREATININE  --   --  1.00   Estimated Creatinine Clearance: 51 ml/min (by C-G formula based on Cr of 1). No results found for this basename: VANCOTROUGH, Leodis Binet, VANCORANDOM, GENTTROUGH, GENTPEAK, GENTRANDOM, TOBRATROUGH, TOBRAPEAK, TOBRARND, AMIKACINPEAK, AMIKACINTROU, AMIKACIN,  in the last 72 hours   Microbiology: Recent Results (from the past 720 hour(s))  MRSA PCR SCREENING     Status: None   Collection Time    03/22/14  9:35 PM      Result Value Ref Range Status   MRSA by PCR NEGATIVE  NEGATIVE Final   Comment:            The GeneXpert MRSA Assay (FDA     approved for NASAL specimens     only), is one component of a     comprehensive MRSA colonization     surveillance program. It is not     intended to diagnose MRSA     infection nor to guide or     monitor treatment for     MRSA infections.    Medical History: Past Medical History  Diagnosis Date  . Diabetes mellitus   . Hyperlipidemia   . Hypertension   . Anemia   . COPD (chronic obstructive pulmonary disease)   . Peripheral vascular disease     Medications:  Prescriptions prior to admission  Medication Sig Dispense Refill  . aspirin EC 81 MG tablet Take 81 mg by mouth daily.        Marland Kitchen lisinopril  (PRINIVIL,ZESTRIL) 40 MG tablet Take 40 mg by mouth daily.        . metFORMIN (GLUCOPHAGE) 1000 MG tablet Take 1,000 mg by mouth 2 (two) times daily with a meal.      . Multiple Vitamin (MULTIVITAMIN WITH MINERALS) TABS tablet Take 1 tablet by mouth daily.      . rosuvastatin (CRESTOR) 40 MG tablet Take 40 mg by mouth daily.         Assessment: 80 y/o male with acute occlusion of left femoral-below-knee popliteal bypass s/p lysis with TNK. He has had episodes of hemoptysis so the heparin drip was stopped. Pharmacy consulted to begin Zosyn for PNA, possible aspiration. Renal function is stable and Tmax is 99.9. WBC are trending down. CXR shows stable bilateral infiltrates.  Goal of Therapy:  Eradication of infection  Plan:  - Zosyn 3.375 g IV q8h to be infused over 4 hours - Consider 8 days of therapy  [facility], [address].D., BCPS Clinical Pharmacist Pager: [phone] 03/25/2014 10:41 AM   Addendum: Consulted to add vancomycin also.  Vancomycin 1000 mg IV once then 500 mg IV q12h  [facility], [place].D., BCPS Clinical Pharmacist Pager: [phone] 03/25/2014 12:06 PM

## 2014-03-25 NOTE — Progress Notes (Addendum)
  [facility] Progress Note  03/25/2014 7:41 AM   Subjective:  On Partial re-breather-mild distress.  Per RN, pt using urinal and desaturated to 70% and became diaphoretic and had brief loss of consciouslness.  States he has COPD, but does not have a lung doctor.  States he is not on oxygen at home.   Tm 99.9 HR 80's-110's regular 130's-140's systolic 100% partial rebreather  Gtts:  heparin  Filed Vitals:   03/25/14 0729  BP:   Pulse:   Temp: 99.9 F (37.7 C)  Resp:     Physical Exam: Cardiac:  Regular Lungs:  CTAB Extremities:  + brisk doppler signal left DP/PT;  Left foot is warm; left calf is soft Abdomen:  Soft, NT/ND  CBC    Component Value Date/Time   WBC 17.6* 03/25/2014 0249   RBC 2.63* 03/25/2014 0249   HGB 7.9* 03/25/2014 0249   HCT 24.0* 03/25/2014 0249   PLT 270 03/25/2014 0249   MCV 91.3 03/25/2014 0249   MCH 30.0 03/25/2014 0249   MCHC 32.9 03/25/2014 0249   RDW 14.7 03/25/2014 0249    BMET    Component Value Date/Time   NA 137 03/25/2014 0249   K 4.4 03/25/2014 0249   CL 100 03/25/2014 0249   CO2 24 03/25/2014 0249   GLUCOSE 222* 03/25/2014 0249   BUN 26* 03/25/2014 0249   CREATININE 1.00 03/25/2014 0249   CALCIUM 8.4 03/25/2014 0249   GFRNONAA 70* 03/25/2014 0249   GFRAA 81* 03/25/2014 0249    INR    Component Value Date/Time   INR 1.17 03/21/2014 1929     Intake/Output Summary (Last 24 hours) at 03/25/14 0741 Last data filed at 03/25/14 0700  Gross per 24 hour  Intake   1657 ml  Output   1800 ml  Net   -143 ml      Assessment:  80 y.o. male is s/p:  Thrombolysis of LLE saphenous vein byass by IR   Plan: -pt's left foot is warm with brisk doppler signals -pt still in respiratory distress--will order a blood gas.  Dr. Darrick Penna thinks less likely to be PE due to heparin gtt, but may need to obtain CT this am.  Needs pulm/CC consult and will defer CT scan to them.  Dr. Hart Rochester called the consult. -Per Dr. Hart Rochester, discontinue the heparin at this  time due to hemoptysis  as Hgb down slightly this am from 8.3 to 7.9 --continues to have a leukocytosis of 17k with mild low grade fever--he is not on any ABx at this time. -DVT prophylaxis:  Heparin gtt-will d/c this due to hemoptysis -vein mapping of bilateral legs for possible need for revision in the future.    Doreatha Massed, PA-C [facility] [phone] 03/25/2014 7:41 AM  Agree with above assessment Left leg with 3+ popliteal and 3+ posterior tibial pulse palpable-left foot well perfused an asymptomatic Biggest issue at present his pulmonary status. Currently on rebreather mask with PO2 65 PCO2 35 pH 7.41 Diffuse bilateral infiltrates on chest x-ray-possible pulmonary fibrosis CCM -- pulmonary medicine consult pending  Plan DC heparin drip and will not plan revision of bypass at this time since he does have excellent posterior tibial pulse. He does have significant disease in distal tibial peroneal trunk. Will get vein mapping to see what kinds are available if needed and continue pulmonary workup.

## 2014-03-25 NOTE — Progress Notes (Signed)
Left Lower Extremity Vein Map    Left Great Saphenous Vein   Segment Diameter Comment  1. Origin 6.29mm   2. High Thigh 2.29mm   3. Mid Thigh 2.30mm   4. Low Thigh 1.9mm Branch  5. At Knee 1.53mm   6. High Calf 1.58mm   7. Low Calf 1.55mm   8. Ankle 1.48mm                 Left Small Saphenous Vein  Segment Diameter Comment  1. Origin    2. High Calf 1.85mm   3. Low Calf 1.39mm   4. Ankle 1.41mm                03/25/2014 3:24 PM Gertie Fey, RVT, RDCS, RDMS

## 2014-03-25 NOTE — Progress Notes (Signed)
Subjective: Pt still with intermittent hemoptysis, dyspnea but improved from 8/2;heparin off; on rebreather; LLE warmer  Objective: Vital signs in last 24 hours: Temp:  [97.5 F (36.4 C)-99.9 F (37.7 C)] 99.9 F (37.7 C) (08/03 0729) Pulse Rate:  [86-118] 118 (08/03 0800) Resp:  [20-41] 35 (08/03 0800) BP: (110-144)/(43-114) 143/74 mmHg (08/03 0800) SpO2:  [91 %-100 %] 98 % (08/03 0800) Last BM Date: 03/22/14  Intake/Output from previous day: 08/02 0701 - 08/03 0700 In: 1657 [P.O.:1285; I.V.:372] Out: 1800 [Urine:1800] Intake/Output this shift:    Rt CFA puncture site soft, clean and dry,NT,no hematoma; dopplerable distal LLE pulses; left foot warmer than right,+PT pulse by doppler on right  Lab Results:   Recent Labs  03/24/14 1203 03/25/14 0249  WBC 17.8* 17.6*  HGB 8.3* 7.9*  HCT 25.1* 24.0*  PLT 279 270   BMET  Recent Labs  03/25/14 0249  NA 137  K 4.4  CL 100  CO2 24  GLUCOSE 222*  BUN 26*  CREATININE 1.00  CALCIUM 8.4   PT/INR No results found for this basename: LABPROT, INR,  in the last 72 hours ABG  Recent Labs  03/24/14 1306 03/25/14 0830  PHART 7.427 7.407  HCO3 21.6 22.1    Studies/Results: Ir Fem Pop Art Pta Mod Sed  03/24/2014   CLINICAL DATA:  48 hr post left lower extremity fem-pop graft lysis.  EXAM: IR THROMB F/U EVAL ART/VEN FINAL DAY; IR FEM POP ART PTA  FLUOROSCOPY TIME:  17 min and 21 seconds.  MEDICATIONS AND MEDICAL HISTORY: Versed 0.5 mg, Fentanyl 25 mcg.  Additional Medications: Heparin 500 units.  ANESTHESIA/SEDATION: Moderate sedation time: 85 minutes  CONTRAST:  50 cc Visipaque 320  PROCEDURE: The procedure, risks, benefits, and alternatives were explained to the patient. Questions regarding the procedure were encouraged and answered. The patient understands and consents to the procedure.  The right groin was prepped with Betadine in a sterile fashion, and a sterile drape was applied covering the operative field. A  sterile gown and sterile gloves were used for the procedure.  Contrast was injected into the multi side-hole catheter and a right lower extremity runoff was performed. This demonstrated resolution of the thrombus with residual narrowing at the distal graft to tibioperoneal trunk anastomosis as well as a distal tibioperoneal trunk stenosis. The heparin was stopped during the procedure. After approximately 20 min, repeat angiogram demonstrates recurrent early occlusion at these areas of narrowing. 500 units of additional heparin was administered.  The infusion catheter was exchanged over a 300 cm stiff a 018 wire for a 4 mm x 2 cm balloon.  A glidewire was advanced through the balloon and across the 2 lesions into the peroneal artery. The glidewire was exchanged for the stiff 018 wire. The 2 lesions were dilated.  Post angioplasty imaging with angiography demonstrates a dissection in the tibioperoneal trunk narrowing location. This area was read dilated and held in place for 60 seconds. Repeat angiogram demonstrates wide patency at the 2 areas of stenosis.  A full left lower extremity runoff demonstrates patency of the left common femoral artery, bypass graft, tibioperoneal trunk, and with 2 vessel runoff via the posterior tibial and peroneal arteries.  The sheath was removed and hemostasis was achieved with an exo seal device.  FINDINGS: Initial angiogram demonstrates resolution of the thrombus throughout the fem-pop venous bypass graft but 2 areas of narrowing distally, at the distal anastomosis, and within the tibioperoneal trunk.  After angioplasty, initial repeat imaging demonstrates  a dissection at the tibioperoneal trunk stenosis.  After repeat angioplasty, the vessel was noted to be widely patent with some irregularity.  Left lower extremity runoff was performed demonstrating patency of the graft, distal anastomosis to the tibioperoneal trunk, and 2 vessel runoff via the posterior tibial artery and peroneal  artery.  COMPLICATIONS: None  IMPRESSION: Successful lysis of a femoral popliteal bypass graft and successful angioplasty of 2 areas of infrapopliteal narrowing as described.   Electronically Signed   By: Maryclare BeanArt  Hoss M.D.   On: 03/24/2014 12:07   Dg Chest Port 1 View  03/25/2014   CLINICAL DATA:  Shortness of breath  EXAM: PORTABLE CHEST - 1 VIEW  COMPARISON:  03/24/2014  FINDINGS: Cardiac shadow is within normal limits. Diffuse bilateral infiltrates are again identified. A PICC line is now been placed on the right with the catheter tip at the cavoatrial junction. No pneumothorax is seen. No other focal abnormality is noted.  IMPRESSION: New PICC line in satisfactory position.  Bilateral parenchymal infiltrates stable from the previous day.   Electronically Signed   By: Alcide CleverMark  Lukens M.D.   On: 03/25/2014 09:12   Dg Chest Port 1 View  03/24/2014   CLINICAL DATA:  Short of breath, coughing congestion  EXAM: PORTABLE CHEST - 1 VIEW  COMPARISON:  Prior chest x-ray 03/24/2014 at 3:28 a.m.  FINDINGS: Persistent diffuse bilateral predominantly interstitial opacities. There has been no significant interval change compared to the radiograph from earlier this morning. Cardiac and mediastinal contours remain unchanged. Atherosclerotic calcifications are present within the transverse aorta. Upper abdominal gas pattern is unremarkable. No acute osseous abnormality.  IMPRESSION: No significant interval change in the appearance of the lungs. Persistent diffuse bilateral interstitial opacities which may reflect pulmonary fibrosis, interstitial edema or atypical infection.   Electronically Signed   By: Malachy MoanHeath  McCullough M.D.   On: 03/24/2014 14:40   Dg Chest Port 1 View  03/24/2014   CLINICAL DATA:  Hemoptysis and wheezing.  EXAM: PORTABLE CHEST - 1 VIEW  COMPARISON:  03/21/2014  FINDINGS: Borderline heart size. Diffuse coarse interstitial infiltrates throughout both lungs likely representing diffuse interstitial fibrosis. No  significant change since previous study, allowing for technical differences. No focal consolidation. No blunting of costophrenic angles. No pneumothorax.  IMPRESSION: Diffuse interstitial infiltrates in the lungs likely fibrosis. No change since prior study.   Electronically Signed   By: Burman NievesWilliam  Stevens M.D.   On: 03/24/2014 04:05   Ir Rande Lawmanhromb F/u Eval Art/ven Final Day (ms)  03/24/2014   CLINICAL DATA:  48 hr post left lower extremity fem-pop graft lysis.  EXAM: IR THROMB F/U EVAL ART/VEN FINAL DAY; IR FEM POP ART PTA  FLUOROSCOPY TIME:  17 min and 21 seconds.  MEDICATIONS AND MEDICAL HISTORY: Versed 0.5 mg, Fentanyl 25 mcg.  Additional Medications: Heparin 500 units.  ANESTHESIA/SEDATION: Moderate sedation time: 85 minutes  CONTRAST:  50 cc Visipaque 320  PROCEDURE: The procedure, risks, benefits, and alternatives were explained to the patient. Questions regarding the procedure were encouraged and answered. The patient understands and consents to the procedure.  The right groin was prepped with Betadine in a sterile fashion, and a sterile drape was applied covering the operative field. A sterile gown and sterile gloves were used for the procedure.  Contrast was injected into the multi side-hole catheter and a right lower extremity runoff was performed. This demonstrated resolution of the thrombus with residual narrowing at the distal graft to tibioperoneal trunk anastomosis as well as a distal  tibioperoneal trunk stenosis. The heparin was stopped during the procedure. After approximately 20 min, repeat angiogram demonstrates recurrent early occlusion at these areas of narrowing. 500 units of additional heparin was administered.  The infusion catheter was exchanged over a 300 cm stiff a 018 wire for a 4 mm x 2 cm balloon.  A glidewire was advanced through the balloon and across the 2 lesions into the peroneal artery. The glidewire was exchanged for the stiff 018 wire. The 2 lesions were dilated.  Post angioplasty  imaging with angiography demonstrates a dissection in the tibioperoneal trunk narrowing location. This area was read dilated and held in place for 60 seconds. Repeat angiogram demonstrates wide patency at the 2 areas of stenosis.  A full left lower extremity runoff demonstrates patency of the left common femoral artery, bypass graft, tibioperoneal trunk, and with 2 vessel runoff via the posterior tibial and peroneal arteries.  The sheath was removed and hemostasis was achieved with an exo seal device.  FINDINGS: Initial angiogram demonstrates resolution of the thrombus throughout the fem-pop venous bypass graft but 2 areas of narrowing distally, at the distal anastomosis, and within the tibioperoneal trunk.  After angioplasty, initial repeat imaging demonstrates a dissection at the tibioperoneal trunk stenosis.  After repeat angioplasty, the vessel was noted to be widely patent with some irregularity.  Left lower extremity runoff was performed demonstrating patency of the graft, distal anastomosis to the tibioperoneal trunk, and 2 vessel runoff via the posterior tibial artery and peroneal artery.  COMPLICATIONS: None  IMPRESSION: Successful lysis of a femoral popliteal bypass graft and successful angioplasty of 2 areas of infrapopliteal narrowing as described.   Electronically Signed   By: Maryclare Bean M.D.   On: 03/24/2014 12:07    Anti-infectives: Anti-infectives   Start     Dose/Rate Route Frequency Ordered Stop   03/26/14 0100  vancomycin (VANCOCIN) 500 mg in sodium chloride 0.9 % 100 mL IVPB     500 mg 100 mL/hr over 60 Minutes Intravenous Every 12 hours 03/25/14 1207     03/25/14 1300  vancomycin (VANCOCIN) IVPB 1000 mg/200 mL premix     1,000 mg 200 mL/hr over 60 Minutes Intravenous  Once 03/25/14 1207     03/25/14 1200  levofloxacin (LEVAQUIN) IVPB 750 mg     750 mg 100 mL/hr over 90 Minutes Intravenous Every 24 hours 03/25/14 1100     03/25/14 1100  piperacillin-tazobactam (ZOSYN) IVPB 3.375 g      3.375 g 12.5 mL/hr over 240 Minutes Intravenous Every 8 hours 03/25/14 1052     03/22/14 0600  ceFAZolin (ANCEF) IVPB 1 g/50 mL premix     1 g 100 mL/hr over 30 Minutes Intravenous  Once 03/21/14 1808 03/22/14 0847      Assessment/Plan: S/p successful lysis of left FPBG and PTA of 2 areas of infrapopliteal narrowing 8/2; plans as per [facility]/CCM; check CT chest  LOS: 4 days    Trevor Morris,D [facility] 03/25/2014

## 2014-03-25 NOTE — H&P (Signed)
[facility] [place]  History and Physical Exam    History of Present Illness  Trevor Morris is a 80 y.o. ([birth year]) male who presents to the [facility] with chief complaint: left leg pain since yesterday 03/20/14. He previously had a left femoral to below knee popliteal bypass with saphenous vein graft in 1999 by Dr. Hart Rochester. He also had angioplasty of the popliteal artery and tibioperoneal trunk in 2009 by Dr. Myra Gianotti. His pain is localized to his left lower leg and occurs at night. He is not ambulatory due to COPD symptoms. He also noted "blue" changes to his left great toe. He denies any problems with his right leg. He was last seen in the office by Dr. Hart Rochester on 11/20/13 for continued follow-up of his bypass. He had no issues at that time.  He denies any non-healing wounds. Denies any chest pain. Has some shortness of breath with exertion. He has no known history of cardiac arrhythmias. He is not on any blood thinners.  He is on an aspirin and a statin. He is diabetic.    Past Medical History   Diagnosis  Date   .  Diabetes mellitus    .  Hyperlipidemia    .  Hypertension    .  Anemia    .  COPD (chronic obstructive pulmonary disease)    .  Peripheral vascular disease       Past Surgical History   Procedure  Laterality  Date   .  Pr vein bypass graft,aorto-fem-pop        History      Social History   .  Marital Status:  Married     Spouse Name:  N/A     Number of Children:  N/A   .  Years of Education:  N/A      Occupational History   .  Not on file.    Social History Main Topics   .  Smoking status:  Former Smoker     Types:  Cigarettes     Quit date:  08/24/1987   .  Smokeless tobacco:  Never Used   .  Alcohol Use:  No   .  Drug Use:  No   .  Sexual Activity:  Not on file      Other Topics  Concern   .  Not on file      Social History Narrative   .  No narrative on file      Family History   Problem  Relation  Age of Onset   .   Diabetes  Other    .  Diabetes  Father    .  Hypertension  Father       Current Outpatient Prescriptions on File Prior to Visit   Medication  Sig  Dispense  Refill   .  aspirin EC 81 MG tablet  Take 81 mg by mouth daily.     Marland Kitchen  lisinopril (PRINIVIL,ZESTRIL) 40 MG tablet  Take 40 mg by mouth daily.     .  metFORMIN (GLUCOPHAGE) 500 MG tablet  Take 1,000 mg by mouth 2 (two) times daily with a meal.     .  rosuvastatin (CRESTOR) 40 MG tablet  Take 40 mg by mouth daily.      No current facility-administered medications on file prior to visit.      Allergies   Allergen  Reactions   .  Omnipaque [Iohexol]  Itching and Rash  REVIEW OF SYSTEMS: (Positives checked otherwise negative)   CARDIOVASCULAR: []  chest pain, []  chest pressure, []  palpitations, [x]  shortness of breath when laying flat, []  shortness of breath with exertion, []  pain in feet when walking, [x]  pain in legs when laying flat, []  history of blood clot in veins (DVT), []  history of phlebitis, []  swelling in legs, []  varicose veins   PULMONARY: []  productive cough, []  asthma, []  wheezing  NEUROLOGIC: [x]  weakness in arms or legs, []  numbness in arms or legs, []  difficulty speaking or slurred speech, []  temporary loss of vision in one eye, []  dizziness   HEMATOLOGIC: []  bleeding problems, []  problems with blood clotting too easily   MUSCULOSKEL: []  joint pain, []  joint swelling   GASTROINTEST: []  vomiting blood, []  blood in stool   GENITOURINARY: []  burning with urination, []  blood in urine   PSYCHIATRIC: []  history of major depression   INTEGUMENTARY: []  rashes, []  ulcers   CONSTITUTIONAL: []  fever, []  chills   For VQI Use Only  PRE-ADM LIVING: Home  AMB STATUS: Ambulatory  CAD Sx: None  PRIOR CHF: None    Physical Examination  Filed Vitals:    03/21/14 1524   BP:  154/66   Pulse:  88   Height:  5\' 4"  (1.626 m)   Weight:  131 lb 4.8 oz (59.557 kg)   SpO2:  95%    Body mass index is 22.53 kg/(m^2).   General: A&O x 3, WDWN male in NAD  Head: [place]/AT  Eyes: Pupils equal  Neck: Supple  Pulmonary: Sym exp, good air movt, CTAB, no rales, rhonchi, & wheezing  Cardiac: RRR, Nl S1, S2, no Murmurs, rubs or gallops  Vascular:  Vessel  Right  Left   Femoral  Palpable  Palpable   Popliteal  Not palpable  Not palpable   PT  Not palpable  Not palpable   DP  Not palpable  Not palpable   Musculoskeletal: M/S 5/5 throughout. Paleness to left great toe with delayed capillary refill.  Neurologic: CN 2-12 intact. Pain and light touch intact in extremities  Psychiatric: Judgment intact, Mood & affect appropriate for pt's clinical situation  Dermatologic: See M/S exam for extremity exam, no rashes otherwise noted   Non-Invasive Vascular Imaging  ABI (Date: 03/21/2014)  RLE: 0.67  LLE: 0.54; evidence of total-occlusion at the distal anastomosis with possible thrombus seen.  Previous ABIs (11/20/13)  RLE: 0.83  LLE: 0.75   Medical Decision Making  [name] is a 80 y.o. male who presents with: acute occlusion of femoral to below knee popliteal bypass. New onset of rest pain yesterday night. Significant ABI changes since last seen in 11/20/13. Duplex showing near occlusion at distal anastomosis with possible thrombus Dr. Darrick PennaFields [facility] and place on heparin drip. He will be scheduled for aortogram with runoff and thrombolysis of his left lower extremity bypass by interventional radiology tomorrow 03/22/14.  -Discontinue heparin on call to IR.  -Patient with contrast allergy. Prednisone dosing upon arrival to hospital, at midnight, and on call to IR. Benadryl on call to IR.  -Hold Metformin.    Maris BergerKimberly Trinh, PA-C  [facility] [place]  Office: [phone]  Pager: [phone]  03/21/2014, 4:56 PM   This patient was seen in conjunction with Dr. Darrick PennaFields.

## 2014-03-26 DIAGNOSIS — I369 Nonrheumatic tricuspid valve disorder, unspecified: Secondary | ICD-10-CM

## 2014-03-26 DIAGNOSIS — J449 Chronic obstructive pulmonary disease, unspecified: Secondary | ICD-10-CM | POA: Diagnosis present

## 2014-03-26 DIAGNOSIS — J9601 Acute respiratory failure with hypoxia: Secondary | ICD-10-CM | POA: Diagnosis not present

## 2014-03-26 DIAGNOSIS — Z5189 Encounter for other specified aftercare: Secondary | ICD-10-CM

## 2014-03-26 DIAGNOSIS — E8779 Other fluid overload: Secondary | ICD-10-CM

## 2014-03-26 DIAGNOSIS — T82898A Other specified complication of vascular prosthetic devices, implants and grafts, initial encounter: Principal | ICD-10-CM

## 2014-03-26 DIAGNOSIS — I214 Non-ST elevation (NSTEMI) myocardial infarction: Secondary | ICD-10-CM | POA: Diagnosis not present

## 2014-03-26 LAB — BASIC METABOLIC PANEL
Anion gap: 9 (ref 5–15)
Anion gap: 13 (ref 5–15)
BUN: 27 mg/dL — AB (ref 6–23)
BUN: 32 mg/dL — AB (ref 6–23)
CALCIUM: 8.2 mg/dL — AB (ref 8.4–10.5)
CO2: 25 mEq/L (ref 19–32)
CO2: 23 mEq/L (ref 19–32)
CREATININE: 1.02 mg/dL (ref 0.50–1.35)
Calcium: 8.4 mg/dL (ref 8.4–10.5)
Chloride: 104 mEq/L (ref 96–112)
Chloride: 100 mEq/L (ref 96–112)
Creatinine, Ser: 1.03 mg/dL (ref 0.50–1.35)
GFR calc Af Amer: 79 mL/min — ABNORMAL LOW (ref >90)
GFR calc Af Amer: 78 mL/min — ABNORMAL LOW (ref >90)
GFR calc non Af Amer: 68 mL/min — ABNORMAL LOW (ref >90)
GFR, EST NON AFRICAN AMERICAN: 67 mL/min — AB (ref >90)
Glucose, Bld: 101 mg/dL — ABNORMAL HIGH (ref 70–99)
Glucose, Bld: 122 mg/dL — ABNORMAL HIGH (ref 70–99)
Potassium: 4.3 mEq/L (ref 3.7–5.3)
Potassium: 3.9 mEq/L (ref 3.7–5.3)
Sodium: 138 mEq/L (ref 137–147)
Sodium: 136 mEq/L — ABNORMAL LOW (ref 137–147)

## 2014-03-26 LAB — GLUCOSE, CAPILLARY
GLUCOSE-CAPILLARY: 107 mg/dL — AB (ref 70–99)
GLUCOSE-CAPILLARY: 108 mg/dL — AB (ref 70–99)
GLUCOSE-CAPILLARY: 124 mg/dL — AB (ref 70–99)
GLUCOSE-CAPILLARY: 139 mg/dL — AB (ref 70–99)
GLUCOSE-CAPILLARY: 190 mg/dL — AB (ref 70–99)
GLUCOSE-CAPILLARY: 94 mg/dL (ref 70–99)
GLUCOSE-CAPILLARY: 96 mg/dL (ref 70–99)
Glucose-Capillary: 100 mg/dL — ABNORMAL HIGH (ref 70–99)
Glucose-Capillary: 131 mg/dL — ABNORMAL HIGH (ref 70–99)
Glucose-Capillary: 139 mg/dL — ABNORMAL HIGH (ref 70–99)
Glucose-Capillary: 163 mg/dL — ABNORMAL HIGH (ref 70–99)
Glucose-Capillary: 165 mg/dL — ABNORMAL HIGH (ref 70–99)
Glucose-Capillary: 191 mg/dL — ABNORMAL HIGH (ref 70–99)
Glucose-Capillary: 202 mg/dL — ABNORMAL HIGH (ref 70–99)
Glucose-Capillary: 97 mg/dL (ref 70–99)

## 2014-03-26 LAB — CBC
HEMATOCRIT: 19.9 % — AB (ref 39.0–52.0)
Hemoglobin: 6.7 g/dL — CL (ref 13.0–17.0)
MCH: 30.9 pg (ref 26.0–34.0)
MCHC: 33.7 g/dL (ref 30.0–36.0)
MCV: 91.7 fL (ref 78.0–100.0)
Platelets: 199 10*3/uL (ref 150–400)
RBC: 2.17 MIL/uL — ABNORMAL LOW (ref 4.22–5.81)
RDW: 14.6 % (ref 11.5–15.5)
WBC: 9.6 10*3/uL (ref 4.0–10.5)

## 2014-03-26 LAB — TROPONIN I
Troponin I: 0.49 ng/mL (ref <0.30)
Troponin I: 0.41 ng/mL (ref <0.30)
Troponin I: 0.42 ng/mL (ref <0.30)

## 2014-03-26 LAB — DIFFERENTIAL
BASOS PCT: 0 % (ref 0–1)
Basophils Absolute: 0 10*3/uL (ref 0.0–0.1)
EOS ABS: 0 10*3/uL (ref 0.0–0.7)
Eosinophils Relative: 0 % (ref 0–5)
Lymphocytes Relative: 5 % — ABNORMAL LOW (ref 12–46)
Lymphs Abs: 0.4 10*3/uL — ABNORMAL LOW (ref 0.7–4.0)
MONO ABS: 0.2 10*3/uL (ref 0.1–1.0)
MONOS PCT: 2 % — AB (ref 3–12)
Neutro Abs: 9 10*3/uL — ABNORMAL HIGH (ref 1.7–7.7)
Neutrophils Relative %: 93 % — ABNORMAL HIGH (ref 43–77)

## 2014-03-26 LAB — PREPARE RBC (CROSSMATCH)

## 2014-03-26 LAB — ABO/RH: ABO/RH(D): A POS

## 2014-03-26 LAB — PROCALCITONIN: Procalcitonin: 0.79 ng/mL

## 2014-03-26 LAB — PRO B NATRIURETIC PEPTIDE: Pro B Natriuretic peptide (BNP): 12292 pg/mL — ABNORMAL HIGH (ref 0–450)

## 2014-03-26 MED ORDER — FUROSEMIDE 10 MG/ML IJ SOLN
10.0000 mg/h | INTRAMUSCULAR | Status: DC
Start: 2014-03-26 — End: 2014-03-27
  Administered 2014-03-26: 10 mg/h via INTRAVENOUS
  Filled 2014-03-26 (×2): qty 25

## 2014-03-26 MED ORDER — INSULIN ASPART 100 UNIT/ML ~~LOC~~ SOLN
0.0000 [IU] | SUBCUTANEOUS | Status: DC
  Administered 2014-03-26: 5 [IU] via SUBCUTANEOUS
  Administered 2014-03-27: 8 [IU] via SUBCUTANEOUS
  Administered 2014-03-27: 5 [IU] via SUBCUTANEOUS
  Administered 2014-03-27: 3 [IU] via SUBCUTANEOUS
  Administered 2014-03-27: 11 [IU] via SUBCUTANEOUS
  Administered 2014-03-28: 5 [IU] via SUBCUTANEOUS
  Administered 2014-03-28: 2 [IU] via SUBCUTANEOUS
  Administered 2014-03-28: 15 [IU] via SUBCUTANEOUS
  Administered 2014-03-29: 5 [IU] via SUBCUTANEOUS
  Administered 2014-03-29: 15 [IU] via SUBCUTANEOUS
  Administered 2014-03-29: 5 [IU] via SUBCUTANEOUS
  Administered 2014-03-29 – 2014-03-30 (×2): 11 [IU] via SUBCUTANEOUS
  Administered 2014-03-30: 5 [IU] via SUBCUTANEOUS
  Administered 2014-03-30: 10:00:00 via SUBCUTANEOUS
  Administered 2014-03-30: 2 [IU] via SUBCUTANEOUS
  Administered 2014-03-30: 8 [IU] via SUBCUTANEOUS
  Administered 2014-03-30: 5 [IU] via SUBCUTANEOUS
  Administered 2014-03-30: 3 [IU] via SUBCUTANEOUS
  Administered 2014-03-31: 2 [IU] via SUBCUTANEOUS
  Administered 2014-03-31: 11 [IU] via SUBCUTANEOUS
  Administered 2014-03-31: 5 [IU] via SUBCUTANEOUS
  Administered 2014-03-31 – 2014-04-01 (×2): 3 [IU] via SUBCUTANEOUS
  Administered 2014-04-01: 5 [IU] via SUBCUTANEOUS
  Administered 2014-04-01: 11 [IU] via SUBCUTANEOUS
  Administered 2014-04-01: 5 [IU] via SUBCUTANEOUS
  Administered 2014-04-01: 2 [IU] via SUBCUTANEOUS
  Administered 2014-04-02 (×2): 5 [IU] via SUBCUTANEOUS
  Administered 2014-04-02: 15 [IU] via SUBCUTANEOUS
  Administered 2014-04-02 (×2): 3 [IU] via SUBCUTANEOUS
  Administered 2014-04-03 (×3): 5 [IU] via SUBCUTANEOUS
  Administered 2014-04-03 – 2014-04-04 (×3): 3 [IU] via SUBCUTANEOUS
  Administered 2014-04-04: 5 [IU] via SUBCUTANEOUS
  Administered 2014-04-04 (×3): 3 [IU] via SUBCUTANEOUS
  Administered 2014-04-05: 2 [IU] via SUBCUTANEOUS

## 2014-03-26 MED ORDER — INSULIN ASPART 100 UNIT/ML ~~LOC~~ SOLN
2.0000 [IU] | SUBCUTANEOUS | Status: DC
  Administered 2014-03-26: 4 [IU] via SUBCUTANEOUS
  Administered 2014-03-26: 2 [IU] via SUBCUTANEOUS

## 2014-03-26 MED ORDER — FUROSEMIDE 10 MG/ML IJ SOLN
40.0000 mg | Freq: Four times a day (QID) | INTRAMUSCULAR | Status: DC
  Administered 2014-03-26: 40 mg via INTRAVENOUS
  Filled 2014-03-26: qty 4

## 2014-03-26 MED ORDER — PIPERACILLIN-TAZOBACTAM 3.375 G IVPB
3.3750 g | Freq: Three times a day (TID) | INTRAVENOUS | Status: DC
  Administered 2014-03-26 – 2014-03-31 (×15): 3.375 g via INTRAVENOUS
  Filled 2014-03-26 (×17): qty 50

## 2014-03-26 MED ORDER — SODIUM CHLORIDE 0.9 % IV SOLN
Freq: Once | INTRAVENOUS | Status: AC
  Administered 2014-03-26: 08:00:00 via INTRAVENOUS

## 2014-03-26 NOTE — Progress Notes (Signed)
PULMONARY / CRITICAL CARE MEDICINE  Name: Trevor Morris MRN: [id number] DOB: 1933/09/10    ADMISSION DATE:  03/21/2014 CONSULTATION DATE: Caroleen Hamman  REFERRING MD :  Oneida Alar   CHIEF COMPLAINT:  Acute hypoxic respiratory failure   INITIAL PRESENTATION:  80 year old male w/ known h/o CM allergy. Underwent angiography and TNK administration for lysis of distal clot at his by-pass graft site on 7/31. His post-op course was complicated by episodes of scant hemoptysis. First noted 8/1 and continued so TNK was eventually stopped. He went to arteriogram 3 times total between 7/31 and 8/2. On 8/2 he began to have significant increase in dyspnea and by the time he returned to the SICU after angiogram required 100% NRB. He received lasix and supportive care. PCCM asked to see 8/3 as symptoms had continued to worsen.   STUDIES:  7/31: arteriogram: Pelvic angiography demonstrates atherosclerotic plaque at the origin of the right and left common iliac arteries without narrowing greater than 50%. The left external iliac artery is patent. Successful placement of an infusion catheter into a thrombosed left femoral popliteal artery bypass graft. TNK was instituted at 0.5 milligrams/hour 8/1: arteriogram: improvement with near complete lysed cysts of the saphenous vein graft. There continues to be occlusive thrombus distally. The plan is for an additional 24 hr of lysis with a follow-up check 8/2 8/2 third trip to angiogram: demonstrates resolution of the thrombus throughout the fem-pop venous bypass graft but 2 areas of narrowing distally, at the distal anastomosis, and within the tibioperoneal trunk.  8/3 CT chest: 1. Extensive bilateral acute interstitial lung disease superimposed on marked changes of COPD. (pulmonary edema vs viral pneumonitis). Small right pleural effusion. Mild mediastinal adenopathy. Dense coronary artery atheromatous calcifications. There is also diffuse distal esophageal wall thickening. This  could be due to incomplete distention, esophagitis or mass. These possibilities could be differentiated with direct endoscopic visualization   SIGNIFICANT EVENTS: 7/30 admitted for clotted fem-pop bypass graft.  7/31: went to IR for angiogram and TNK.  8/1, 230 p: TNK stopped due to scant hemoptysis. Resumed at 5p. 1/2 dose  8/1 630: hemoptysis returned and was not resumed. Heparin continued.  8/2: increased shortness of breath.  8/2: placed on 100% NRB after returning from angiogram.  8/3 PCCM called. Still very hypoxic on 100% NRB. Had episode where sats dropped to 70s. Heparin stopped due to some episode of hemoptysis.   LINES/TUBES: PICC 8/3>>  INTERVAL HISTORY:  Nurse reports small amount of hemoptysis this morning with pt dropping into the 90s when ambulating to the bathroom - currently still on NRB with O2 at 100% Patient reports only sleeping 3 hours and complains of constipation  VITAL SIGNS: Temp:  [97.3 F (36.3 C)-99.9 F (37.7 C)] 97.3 F (36.3 C) (08/04 0001) Pulse Rate:  [69-118] 69 (08/04 0400) Resp:  [19-38] 21 (08/04 0400) BP: (105-147)/(39-74) 105/39 mmHg (08/04 0400) SpO2:  [95 %-100 %] 100 % (08/04 0400) Weight:  [133 lb 13.1 oz (60.7 kg)] 133 lb 13.1 oz (60.7 kg) (08/04 0600) 100%   HEMODYNAMICS: CVP:  [3 mmHg-6 mmHg] 4 mmHg VENTILATOR SETTINGS:   INTAKE / OUTPUT:  Intake/Output Summary (Last 24 hours) at 03/26/14 0626 Last data filed at 03/26/14 0400  Gross per 24 hour  Intake 2517.56 ml  Output   1875 ml  Net 642.56 ml    PHYSICAL EXAMINATION: General:  80 year old male, currently on 100% NRB Neuro:  Awake, oriented no focal def  HEENT:  No lymphadenopathy,  PERRL, EOMs intact, no JVD Cardiovascular:  Tachy with no M/R/G, posterior tibilas pulses +2 Lungs:  Fine crackles in LL bilaterally with rare rhonchi in RLL Abdomen:  Soft, no tenderness to palpation Musculoskeletal:  Intact  Skin:  Intact   LABS:  CBC  Recent Labs Lab  03/24/14 1203 03/25/14 0249 03/26/14 0430  WBC 17.8* 17.6* 9.6  HGB 8.3* 7.9* 6.7*  HCT 25.1* 24.0* 19.9*  PLT 279 270 199   Coag's  Recent Labs Lab 03/21/14 1929  INR 1.17   BMET  Recent Labs Lab 03/21/14 1929 03/22/14 0331 03/25/14 0249  NA 140 136* 137  K 4.2 4.5 4.4  CL 104 103 100  CO2 22 21 24   BUN 11 12 26*  CREATININE 1.06 1.02 1.00  GLUCOSE 113* 194* 222*   Electrolytes  Recent Labs Lab 03/21/14 1929 03/22/14 0331 03/25/14 0249  CALCIUM 9.2 9.0 8.4   Sepsis Markers  Recent Labs Lab 03/25/14 1035 03/26/14 0430  PROCALCITON 0.50 0.79    ABG  Recent Labs Lab 03/24/14 1306 03/25/14 0830  PHART 7.427 7.407  PCO2ART 32.8* 36.3  PO2ART 97.0 67.8*   Liver Enzymes  Recent Labs Lab 03/21/14 1929  AST 20  ALT 16  ALKPHOS 88  BILITOT 0.3  ALBUMIN 3.2*   Cardiac Enzymes  Recent Labs Lab 03/25/14 0249 03/25/14 1034 03/25/14 1634 03/25/14 2159 03/26/14 0430  TROPONINI <0.30 <0.30 0.63* 0.94*  --   PROBNP  --  9493.0*  --   --  12292.0*   Glucose  Recent Labs Lab 03/25/14 1842 03/25/14 2001 03/25/14 2158 03/25/14 2304 03/25/14 2336 03/26/14 0056  GLUCAP 151* 119* 85 124* 125* 107*   Imaging Ct Chest Wo Contrast  03/25/2014   ADDENDUM REPORT: 03/25/2014 16:49  ADDENDUM: There is also diffuse distal esophageal wall thickening. This could be due to incomplete distention, esophagitis or mass. These possibilities could be differentiated with direct endoscopic visualization.   Electronically Signed   By: Enrique Sack M.D.   On: 03/25/2014 16:49   03/25/2014   CLINICAL DATA:  Evaluate lung infiltrates seen on the portable chest earlier today.  EXAM: CT CHEST WITHOUT CONTRAST  TECHNIQUE: Multidetector CT imaging of the chest was performed following the standard protocol without IV contrast.  COMPARISON:  Portable chest obtained earlier today. Abdomen CT dated 02/19/2014.  FINDINGS: Interval diffuse increase in prominence of the  interstitial markings throughout the majority of both lungs, including ground-glass opacities. Extensive bullous changes bilaterally. No masses or focal consolidation. Enlarged precarinal node with a short axis diameter of 19.7 mm on image number 23. Mildly enlarged AP window nodes, the largest with a short axis diameter of 9.3 mm on image number 22.  Small right pleural effusion. Dense coronary artery calcifications. Diffuse low density of the blood. Diffuse distal esophageal wall thickening with a maximum thickness of 10.3 mm on image number 49. Unremarkable upper abdomen. Mild thoracic spine degenerative changes.  IMPRESSION: 1. Extensive bilateral acute interstitial lung disease superimposed on marked changes of COPD. Differential considerations include pulmonary edema and viral pneumonitis. 2. Small right pleural effusion. 3. Mild mediastinal adenopathy, possibly reactive. 4. Dense coronary artery atheromatous calcifications. 5. Anemia.  Electronically Signed: By: Enrique Sack M.D. On: 03/25/2014 16:46   Dg Chest Port 1 View  03/25/2014   CLINICAL DATA:  Shortness of breath  EXAM: PORTABLE CHEST - 1 VIEW  COMPARISON:  03/24/2014  FINDINGS: Cardiac shadow is within normal limits. Diffuse bilateral infiltrates are again identified. A PICC  line is now been placed on the right with the catheter tip at the cavoatrial junction. No pneumothorax is seen. No other focal abnormality is noted.  IMPRESSION: New PICC line in satisfactory position.  Bilateral parenchymal infiltrates stable from the previous day.   Electronically Signed   By: Inez Catalina M.D.   On: 03/25/2014 09:12   ASSESSMENT / PLAN: PULMONOLOGY: A: Acute hypoxemic respiratory failure  CT suggests baseline ILD and emphysema, he reports history of neither Acute process is either pulm edema, alveolar hemorrhage (received TNK), or pneumonia, less likely acute inflammatory (primary pulmonary) process or ARDS PCT trending up Hemoptysis P: Goal  SpO2>92 Supplemental oxygen ABX broad spectrum ( see infectious) Hold steroids today Lasix today Hold anticoagulants Bronchodilators PRN Obtain baseline CXR interpretation from PCP  CARDIOLOGY: A: NSTEMI, LBBB, tachycardia,and PAC's on EKG Acute CHF exac? BNP- 12292 Hx: HTN CVP unhelpful through PICC Hx: Hyperlipidemia P: Fu echo Lasix today Cardiology consult today  INFECTIOUS: A: Increased ESR- 60 P: Levaquin 8/3>> day 1/x Zosyn 8/3 >> day 1/x Vanco 8/3 >>day 1/x  HEMATOLOGY: A: Anemia, acute on chronic disease( baseline 10.3) > due to [facility]? No clear gi loss, esophageal lesion on CT Hypercoagulability  Aspirin and Heparin   P: Transfusion this morning 1 unit- Hgb 6.7 Follow Coag panel  Hemoccult stool  RENAL: A: No acute issues P: BMET   GI: A: Distal esophageal wall thickening- ??incomplete distension, esophagitis, mass Heart healthy diet Constipation P: Consult GI for possible endoscopy after oxygenation improves Hemoccult blood Advance diet Stool softeners  ENDOCRINE: A: Hx DM P: SSI  GLOBAL: Discussed with son / patient >>> they are agreeable to short term ventilatory support if required. Would not be able to tolerate FOB unless intubated - would defer for now  Dominga Ferry, PA-S [facility] [place] Pager: [phone]   Attending:  I have seen and examined the patient with nurse practitioner/resident and agree with and have edited the note above.   CC time 33 minutes  Roselie Awkward, MD [facility] Pager: [phone] Cell: [phone] If no response, call [phone]    03/26/2014, 6:26 AM

## 2014-03-26 NOTE — Progress Notes (Signed)
  Echocardiogram 2D Echocardiogram has been performed.  Leta Jungling M 03/26/2014, 1:01 PM

## 2014-03-26 NOTE — Consult Note (Signed)
CARDIOLOGY CONSULT NOTE   Patient ID: Anne FuCharles H Nauta MRN: [id number], DOB/AGE: 78/10/1933   Admit date: 03/21/2014 Date of Consult: 03/26/2014  Primary Physician: Lucianne LeiUPPIN,NINA, MD Primary Cardiologist: None  Reason for consult:  NSTEMI  Problem List  Past Medical History  Diagnosis Date  . Diabetes mellitus   . Hyperlipidemia   . Hypertension   . Anemia   . COPD (chronic obstructive pulmonary disease)   . Peripheral vascular disease     Past Surgical History  Procedure Laterality Date  . Pr vein bypass graft,aorto-fem-pop      Allergies  Allergies  Allergen Reactions  . Prednisone Hives  . Omnipaque [Iohexol] Itching and Rash    HPI   80 year old male with known PVD, prior femoral to popliteal artery bypass grafting, who presented on 03/20/14 with acute occlusion at  The graft site. He underwent TNK administration for lysis of distal clot at his by-pass graft site on 7/31. His post-op course was complicated by episodes of scant hemoptysis. First noted 8/1 and continued so TNK was eventually stopped. He went to arteriogram 3 times total between 7/31 and 8/2. On 8/2 he began to have significant increase in dyspnea and by the time he returned to the SICU after angiogram required 100% NRB. He received lasix and supportive care.  Heparin drip was discontinued yesterday, his hemoptysis has improved but is still present. He is on non-rebreather.  There are diffuse bilateral infiltrates on CXR, differential includes PNA, aspiration, ARDS of severe CHF. He was started on broad spectrum antibiotics. He also has diffuse distal esophageal wall thickening. Suspicious for esophagitis or mass. He is being transfused 1 unit of PRBC for anemia 7.9 --> 6.7 today.   He denies any chest pain, back pain, jaw pain now or earlier. He has no prior cardiac history and has never seen a cardiologist.  No orthopnea, PND.    Inpatient Medications  . aspirin EC  325 mg Oral Daily  . atorvastatin   80 mg Oral q1800  . docusate sodium  100 mg Oral BID  . feeding supplement (GLUCERNA SHAKE)  237 mL Oral TID BM  . furosemide  40 mg Intravenous Q6H  . levofloxacin (LEVAQUIN) IV  750 mg Intravenous Q24H  . lisinopril  40 mg Oral Daily  . multivitamin with minerals  1 tablet Oral Daily  . pantoprazole  40 mg Oral Daily  . piperacillin-tazobactam (ZOSYN)  IV  3.375 g Intravenous Q8H  . potassium chloride  20-40 mEq Oral Once  . sodium chloride  10-40 mL Intracatheter Q12H  . sodium chloride  3 mL Intravenous Q12H  . vancomycin  500 mg Intravenous Q12H   Family History Family History  Problem Relation Age of Onset  . Diabetes Other   . Diabetes Father   . Hypertension Father     Social History History   Social History  . Marital Status: Married    Spouse Name: N/A    Number of Children: N/A  . Years of Education: N/A   Occupational History  . Not on file.   Social History Main Topics  . Smoking status: Former Smoker    Types: Cigarettes    Quit date: 08/24/1987  . Smokeless tobacco: Never Used  . Alcohol Use: No  . Drug Use: No  . Sexual Activity: Not on file   Other Topics Concern  . Not on file   Social History Narrative  . No narrative on file    Review of  Systems  General:  No chills, fever, night sweats or weight changes.  Cardiovascular:  No chest pain, dyspnea on exertion, edema, orthopnea, palpitations, paroxysmal nocturnal dyspnea. Dermatological: No rash, lesions/masses Respiratory: No cough, dyspnea Urologic: No hematuria, dysuria Abdominal:   No nausea, vomiting, diarrhea, bright red blood per rectum, melena, or hematemesis Neurologic:  No visual changes, wkns, changes in mental status. All other systems reviewed and are otherwise negative except as noted above.  Physical Exam  Blood pressure 114/59, pulse 94, temperature 97.4 F (36.3 C), temperature source Axillary, resp. rate 29, height 5\' 4"  (1.626 m), weight 133 lb 13.1 oz (60.7 kg), SpO2  99.00%.  General: Pleasant, in mild distress on NR Psych: Normal affect. Neuro: Alert and oriented X 3. Moves all extremities spontaneously. HEENT: Normal  Neck: Supple without bruits, JVD + 6 cm Lungs:  Resp regular and unlabored, crackles B/L up to mid lungs. Heart: RRR no s3, s4, or murmurs. Abdomen: Soft, non-tender, non-distended, BS + x 4.  Extremities: No clubbing, cyanosis or edema. DP/PT/pulses very weak, B/L mild edema.  Labs  Recent Labs  03/25/14 0249 03/25/14 1034 03/25/14 1634 03/25/14 2159  TROPONINI <0.30 <0.30 0.63* 0.94*   Lab Results  Component Value Date   WBC 9.6 03/26/2014   HGB 6.7* 03/26/2014   HCT 19.9* 03/26/2014   MCV 91.7 03/26/2014   PLT 199 03/26/2014    Recent Labs Lab 03/21/14 1929  03/25/14 0249  NA 140  < > 137  K 4.2  < > 4.4  CL 104  < > 100  CO2 22  < > 24  BUN 11  < > 26*  CREATININE 1.06  < > 1.00  CALCIUM 9.2  < > 8.4  PROT 7.3  --   --   BILITOT 0.3  --   --   ALKPHOS 88  --   --   ALT 16  --   --   AST 20  --   --   GLUCOSE 113*  < > 222*  < > = values in this interval not displayed.  Radiology/Studies  Ct Chest Wo Contrast  03/25/2014   ADDENDUM REPORT: 03/25/2014 16:49  ADDENDUM: There is also diffuse distal esophageal wall thickening. This could be due to incomplete distention, esophagitis or mass. These possibilities could be differentiated with direct endoscopic visualization.   Electronically Signed   By: Gordan Payment M.D.   On: 03/25/2014 16:49   Dg Chest Port 1 View  03/25/2014   CLINICAL DATA:  Shortness of breath  EXAM: PORTABLE CHEST - 1 VIEW  COMPARISON:  03/24/2014  FINDINGS: Cardiac shadow is within normal limits. Diffuse bilateral infiltrates are again identified. A PICC line is now been placed on the right with the catheter tip at the cavoatrial junction. No pneumothorax is seen. No other focal abnormality is noted.  IMPRESSION: New PICC line in satisfactory position.  Bilateral parenchymal infiltrates stable from the  previous day.     Ir Rande Lawman F/u Eval Art/ven Final Day (ms)  03/24/2014   CLINICAL DATA:  48 hr post left lower extremity fem-pop graft lysis.  IMPRESSION: Successful lysis of a femoral popliteal bypass graft and successful angioplasty of 2 areas of infrapopliteal narrowing as described.      Echocardiogram - none  ECG: LBBB, no prior to admission for comparison.    ASSESSMENT AND PLAN  80 year old male post lysis of distal clot at his by-pass graft site on 7/31, complicated by hemoptysis, acute hypoxic  respiratory failure, CHF and NSTEMI.  1. NSTEMI - possibly demand ischemia secondary to profound anemia, the patient certainly has CAD as he has severe calcifications of all 3 coronary arteries on chest CT. TnI 0.0 --0.63 --> 0.94. ECG shows LBBB, no prior to  Admission for comparison. - We would recommend to keep Hb > 10 g/dL, or at least 9 g/dL - Use of Heparin drip was dicussed with Dr Kendrick Fries, he prefers not to use Heparin as the patient has ongoing hemoptysis with possible aspiration and profound anemia. We will perform echocardiogram and if there are significant wall motion abnormalities, we will consider use of heparin and possible cath - we will continue trending troponin until downtrending  2. Acute fluid overload - positive fluid balance in the last 24 hours - Start Lasix drip with 10 mg/hour, monitor Crea closely, currently normal - echo is pending, no prior, LVEF unknown  We will follow.  Signed, Lars Masson, MD, [facility] 03/26/2014, 10:56 AM

## 2014-03-26 NOTE — Progress Notes (Signed)
Subjective: Patient denies any LLE pain. He admits to small amount of hemoptysis still but improving.   Objective: Physical Exam: BP 114/59  Pulse 94  Temp(Src) 97.4 F (36.3 C) (Axillary)  Resp 29  Ht 5\' 4"  (1.626 m)  Wt 133 lb 13.1 oz (60.7 kg)  BMI 22.96 kg/m2  SpO2 99%  General: On NRB Ext: Left DP/PT intact with doppler, Right PT intact with doppler, feet warm bilaterally.  Labs: CBC  Recent Labs  03/25/14 0249 03/26/14 0430  WBC 17.6* 9.6  HGB 7.9* 6.7*  HCT 24.0* 19.9*  PLT 270 199   BMET  Recent Labs  03/25/14 0249  NA 137  K 4.4  CL 100  CO2 24  GLUCOSE 222*  BUN 26*  CREATININE 1.00  CALCIUM 8.4   LFT No results found for this basename: PROT, ALBUMIN, AST, ALT, ALKPHOS, BILITOT, BILIDIR, IBILI, LIPASE,  in the last 72 hours PT/INR No results found for this basename: LABPROT, INR,  in the last 72 hours   Studies/Results: Ct Chest Wo Contrast  03/25/2014   ADDENDUM REPORT: 03/25/2014 16:49  ADDENDUM: There is also diffuse distal esophageal wall thickening. This could be due to incomplete distention, esophagitis or mass. These possibilities could be differentiated with direct endoscopic visualization.   Electronically Signed   By: Gordan PaymentSteve  Reid M.D.   On: 03/25/2014 16:49   03/25/2014   CLINICAL DATA:  Evaluate lung infiltrates seen on the portable chest earlier today.  EXAM: CT CHEST WITHOUT CONTRAST  TECHNIQUE: Multidetector CT imaging of the chest was performed following the standard protocol without IV contrast.  COMPARISON:  Portable chest obtained earlier today. Abdomen CT dated 02/19/2014.  FINDINGS: Interval diffuse increase in prominence of the interstitial markings throughout the majority of both lungs, including ground-glass opacities. Extensive bullous changes bilaterally. No masses or focal consolidation. Enlarged precarinal node with a short axis diameter of 19.7 mm on image number 23. Mildly enlarged AP window nodes, the largest with a short axis  diameter of 9.3 mm on image number 22.  Small right pleural effusion. Dense coronary artery calcifications. Diffuse low density of the blood. Diffuse distal esophageal wall thickening with a maximum thickness of 10.3 mm on image number 49. Unremarkable upper abdomen. Mild thoracic spine degenerative changes.  IMPRESSION: 1. Extensive bilateral acute interstitial lung disease superimposed on marked changes of COPD. Differential considerations include pulmonary edema and viral pneumonitis. 2. Small right pleural effusion. 3. Mild mediastinal adenopathy, possibly reactive. 4. Dense coronary artery atheromatous calcifications. 5. Anemia.  Electronically Signed: By: Gordan PaymentSteve  Reid M.D. On: 03/25/2014 16:46   Ir Fem Pop Art Pta Mod Sed  03/24/2014   CLINICAL DATA:  48 hr post left lower extremity fem-pop graft lysis.  EXAM: IR THROMB F/U EVAL ART/VEN FINAL DAY; IR FEM POP ART PTA  FLUOROSCOPY TIME:  17 min and 21 seconds.  MEDICATIONS AND MEDICAL HISTORY: Versed 0.5 mg, Fentanyl 25 mcg.  Additional Medications: Heparin 500 units.  ANESTHESIA/SEDATION: Moderate sedation time: 85 minutes  CONTRAST:  50 cc Visipaque 320  PROCEDURE: The procedure, risks, benefits, and alternatives were explained to the patient. Questions regarding the procedure were encouraged and answered. The patient understands and consents to the procedure.  The right groin was prepped with Betadine in a sterile fashion, and a sterile drape was applied covering the operative field. A sterile gown and sterile gloves were used for the procedure.  Contrast was injected into the multi side-hole catheter and a right lower extremity runoff was  performed. This demonstrated resolution of the thrombus with residual narrowing at the distal graft to tibioperoneal trunk anastomosis as well as a distal tibioperoneal trunk stenosis. The heparin was stopped during the procedure. After approximately 20 min, repeat angiogram demonstrates recurrent early occlusion at these  areas of narrowing. 500 units of additional heparin was administered.  The infusion catheter was exchanged over a 300 cm stiff a 018 wire for a 4 mm x 2 cm balloon.  A glidewire was advanced through the balloon and across the 2 lesions into the peroneal artery. The glidewire was exchanged for the stiff 018 wire. The 2 lesions were dilated.  Post angioplasty imaging with angiography demonstrates a dissection in the tibioperoneal trunk narrowing location. This area was read dilated and held in place for 60 seconds. Repeat angiogram demonstrates wide patency at the 2 areas of stenosis.  A full left lower extremity runoff demonstrates patency of the left common femoral artery, bypass graft, tibioperoneal trunk, and with 2 vessel runoff via the posterior tibial and peroneal arteries.  The sheath was removed and hemostasis was achieved with an exo seal device.  FINDINGS: Initial angiogram demonstrates resolution of the thrombus throughout the fem-pop venous bypass graft but 2 areas of narrowing distally, at the distal anastomosis, and within the tibioperoneal trunk.  After angioplasty, initial repeat imaging demonstrates a dissection at the tibioperoneal trunk stenosis.  After repeat angioplasty, the vessel was noted to be widely patent with some irregularity.  Left lower extremity runoff was performed demonstrating patency of the graft, distal anastomosis to the tibioperoneal trunk, and 2 vessel runoff via the posterior tibial artery and peroneal artery.  COMPLICATIONS: None  IMPRESSION: Successful lysis of a femoral popliteal bypass graft and successful angioplasty of 2 areas of infrapopliteal narrowing as described.   Electronically Signed   By: Maryclare Bean M.D.   On: 03/24/2014 12:07   Dg Chest Port 1 View  03/25/2014   CLINICAL DATA:  Shortness of breath  EXAM: PORTABLE CHEST - 1 VIEW  COMPARISON:  03/24/2014  FINDINGS: Cardiac shadow is within normal limits. Diffuse bilateral infiltrates are again identified. A PICC  line is now been placed on the right with the catheter tip at the cavoatrial junction. No pneumothorax is seen. No other focal abnormality is noted.  IMPRESSION: New PICC line in satisfactory position.  Bilateral parenchymal infiltrates stable from the previous day.   Electronically Signed   By: Alcide Clever M.D.   On: 03/25/2014 09:12   Dg Chest Port 1 View  03/24/2014   CLINICAL DATA:  Short of breath, coughing congestion  EXAM: PORTABLE CHEST - 1 VIEW  COMPARISON:  Prior chest x-ray 03/24/2014 at 3:28 a.m.  FINDINGS: Persistent diffuse bilateral predominantly interstitial opacities. There has been no significant interval change compared to the radiograph from earlier this morning. Cardiac and mediastinal contours remain unchanged. Atherosclerotic calcifications are present within the transverse aorta. Upper abdominal gas pattern is unremarkable. No acute osseous abnormality.  IMPRESSION: No significant interval change in the appearance of the lungs. Persistent diffuse bilateral interstitial opacities which may reflect pulmonary fibrosis, interstitial edema or atypical infection.   Electronically Signed   By: Malachy Moan M.D.   On: 03/24/2014 14:40   Ir Rande Lawman F/u Eval Art/ven Final Day (ms)  03/24/2014   CLINICAL DATA:  48 hr post left lower extremity fem-pop graft lysis.  EXAM: IR THROMB F/U EVAL ART/VEN FINAL DAY; IR FEM POP ART PTA  FLUOROSCOPY TIME:  17 min and 21 seconds.  MEDICATIONS AND MEDICAL HISTORY: Versed 0.5 mg, Fentanyl 25 mcg.  Additional Medications: Heparin 500 units.  ANESTHESIA/SEDATION: Moderate sedation time: 85 minutes  CONTRAST:  50 cc Visipaque 320  PROCEDURE: The procedure, risks, benefits, and alternatives were explained to the patient. Questions regarding the procedure were encouraged and answered. The patient understands and consents to the procedure.  The right groin was prepped with Betadine in a sterile fashion, and a sterile drape was applied covering the operative field.  A sterile gown and sterile gloves were used for the procedure.  Contrast was injected into the multi side-hole catheter and a right lower extremity runoff was performed. This demonstrated resolution of the thrombus with residual narrowing at the distal graft to tibioperoneal trunk anastomosis as well as a distal tibioperoneal trunk stenosis. The heparin was stopped during the procedure. After approximately 20 min, repeat angiogram demonstrates recurrent early occlusion at these areas of narrowing. 500 units of additional heparin was administered.  The infusion catheter was exchanged over a 300 cm stiff a 018 wire for a 4 mm x 2 cm balloon.  A glidewire was advanced through the balloon and across the 2 lesions into the peroneal artery. The glidewire was exchanged for the stiff 018 wire. The 2 lesions were dilated.  Post angioplasty imaging with angiography demonstrates a dissection in the tibioperoneal trunk narrowing location. This area was read dilated and held in place for 60 seconds. Repeat angiogram demonstrates wide patency at the 2 areas of stenosis.  A full left lower extremity runoff demonstrates patency of the left common femoral artery, bypass graft, tibioperoneal trunk, and with 2 vessel runoff via the posterior tibial and peroneal arteries.  The sheath was removed and hemostasis was achieved with an exo seal device.  FINDINGS: Initial angiogram demonstrates resolution of the thrombus throughout the fem-pop venous bypass graft but 2 areas of narrowing distally, at the distal anastomosis, and within the tibioperoneal trunk.  After angioplasty, initial repeat imaging demonstrates a dissection at the tibioperoneal trunk stenosis.  After repeat angioplasty, the vessel was noted to be widely patent with some irregularity.  Left lower extremity runoff was performed demonstrating patency of the graft, distal anastomosis to the tibioperoneal trunk, and 2 vessel runoff via the posterior tibial artery and peroneal  artery.  COMPLICATIONS: None  IMPRESSION: Successful lysis of a femoral popliteal bypass graft and successful angioplasty of 2 areas of infrapopliteal narrowing as described.   Electronically Signed   By: Maryclare Bean M.D.   On: 03/24/2014 12:07    Assessment/Plan: S/p successful lysis of left FPBG and PTA of 2 areas of infrapopliteal narrowing 8/2; DP/PT intact with doppler, small amounts of hemoptysis still, follow CBC may need transfusion. CT chest includes possible pulmonary edema and viral pneumonitis, also diffuse distal esophageal wall thickening. This could be due to incomplete distention, esophagitis or mass. plans as per VVS/CCM Patient will need follow-up in 1 month after discharge for evaluation in IR clinic with segmental LE arterial done prior to visit.     LOS: 5 days    Berneta Levins PA-C 03/26/2014 10:15 AM

## 2014-03-26 NOTE — Clinical Documentation Improvement (Signed)
Clinical Documentation Clarification #1:  Possible Clinical Conditions?  Chronic Systolic Congestive Heart Failure Chronic Diastolic Congestive Heart Failure Chronic Systolic & Diastolic Congestive Heart Failure Acute Systolic Congestive Heart Failure Acute Diastolic Congestive Heart Failure Acute Systolic & Diastolic Congestive Heart Failure Acute on Chronic Systolic Congestive Heart Failure Acute on Chronic Diastolic Congestive Heart Failure Acute on Chronic Systolic & Diastolic Congestive Heart Failure Other Condition Cannot Clinically Determine   Risk Factors: A degree of superimposed CHF cannot be excluded radiographically per 7/31 CXR results.  Labs: 8/03: proBNP: 9,493.0 8/04: proBNP: 12,292.0 ________________________________________________________________________________________________ Clinical Documentation Clarification #2:   Possible Clinical Conditions?  NSTEMI Demand Ischemia Other Condition Cannot Clinically Determine   Risk Factors: NSTEMI noted per problem list, dated 03/26/14 (no documentation in the progress notes). ST depression, LVH, possible LBBB, tachycardia, PACs on EKG, and troponin positive 0.94 per 8/04 progress notes.  Labs: 8/03: troponin I:  0.94;  0.63. ________________________________________________________________________________________________ Clinical Documentation Clarification #3:  Possible Clinical Conditions?  Acute on Chronic Blood Loss Anemia Acute Blood Loss Anemia on Anemia of Chronic Disease Other Condition Cannot Clinically Determine   Risk Factors: Anemia, acute/chronic disease (baseline 10.3), transfuse this morning with 1 units--hemoglobin 6.7, per 8/04 progress notes. Has had episodes of hemoptysis so the heparin drip was stopped, per 8/03 progress notes.   Thank You, Marciano Sequin, Clinical Documentation Specialist:  [phone]  [facility]- Health Information Management

## 2014-03-26 NOTE — Progress Notes (Addendum)
  [facility] Progress Note  03/26/2014 7:41 AM  Subjective:  On NRB. Complaining of no pain in his left leg and foot. Still having hemoptysis. Coughing up "thick black" sputum. Denies any chest pain, chest pressure, abdominal pain, fever and chills. Has not had bowel movement. Denies NSAID abuse history.   Filed Vitals:   03/26/14 0700  BP: 125/50  Pulse: 77  Temp:   Resp: 24   02: 100% NRB  Abx: vancomycin, zosyn, levaquin  Physical Exam: Incisions:  Right groin without hematoma.  Extremities:  Palpable 2+ left posterior tibialis pulse. Left foot is warm. Motor and sensory intact. Left great toe duskiness significantly improved.  Cardiac: regular rate and rhythm Lungs: bilateral fine rales   CBC    Component Value Date/Time   WBC 9.6 03/26/2014 0430   RBC 2.17* 03/26/2014 0430   HGB 6.7* 03/26/2014 0430   HCT 19.9* 03/26/2014 0430   PLT 199 03/26/2014 0430   MCV 91.7 03/26/2014 0430   MCH 30.9 03/26/2014 0430   MCHC 33.7 03/26/2014 0430   RDW 14.6 03/26/2014 0430    BMET    Component Value Date/Time   NA 137 03/25/2014 0249   K 4.4 03/25/2014 0249   CL 100 03/25/2014 0249   CO2 24 03/25/2014 0249   GLUCOSE 222* 03/25/2014 0249   BUN 26* 03/25/2014 0249   CREATININE 1.00 03/25/2014 0249   CALCIUM 8.4 03/25/2014 0249   GFRNONAA 70* 03/25/2014 0249   GFRAA 81* 03/25/2014 0249    INR    Component Value Date/Time   INR 1.17 03/21/2014 1929     Intake/Output Summary (Last 24 hours) at 03/26/14 0741 Last data filed at 03/26/14 0600  Gross per 24 hour  Intake 2604.11 ml  Output   1875 ml  Net 729.11 ml     Assessment:  80 y.o. male is s/p:   Thrombolysis of LLE saphenous vein bypass by IR  Plan: -Left leg with palpable PT pulse. No revision planned at this time. He does have significant disease in distal peroneal trunk. Vein mapping in the event revascularization is needed.  -Acute hypoxemic respiratory failure secondary to ?PNA, aspiration, ARDS. On NRB. Diffuse  bilateral infiltrates on CXR.Possible baseline interstitial disease. Possible COPD.  Started on broad spectrum abx- vanc, zosyn, levaquin and solumedrol.  Appreciate CCM following.  -Hemopytsis: CT chest 8/3 with diffuse distal esophageal wall thickening. Possible incomplete distention, esophagitis or mass. ?EGD -Anemia: Hgb 6.7 today. Down from 7.9 yesterday. Secondary to hemoptysis? Check hemoccult. Bowel regimen. ?Transfuse. -DVT prophylaxis:  Heparin discontinued due to hemoptysis.    [name], PA-C [facility] Office: [phone] Pager: [phone] 03/26/2014 7:41 AM  Agree with above assessment Left leg is quite stable with 3+ popliteal and posterior tibial pulse palpable and no evidence of ischemia left foot. Patient is now off of his heparin drip. Remains anemic wit hematocrit equal to 19 Patient is breathing much more comfortably this morning on face mask oxygen with heart rate in 90s  Continue evaluation and treatment per CCM No further plans for vascular surgery at this time Will followup patient in office after discharge Continue evaluation for cardiac or pulmonary events

## 2014-03-26 NOTE — Progress Notes (Signed)
CRITICAL VALUE ALERT  Critical value received: Hgb 6.7  Date of notification: 03/26/14  Time of notification: 0500  Critical value read back: yes  Nurse who received alert: Brayton Caves, RN  MD notified (1st page): CCM  Time of first page: 0530  Responding MD: Dr. Molli Knock  Time MD responded: [phone]

## 2014-03-26 NOTE — Progress Notes (Signed)
Consider changing patient to "Glycemic Control Order Set" since patient is on PO diet-Novolog moderate tid with meals and HS.   Thanks, Beryl Meager, RN, BC-ADM Inpatient Diabetes Coordinator Pager [phone]

## 2014-03-27 ENCOUNTER — Inpatient Hospital Stay (HOSPITAL_COMMUNITY): Payer: Medicare Other

## 2014-03-27 DIAGNOSIS — E876 Hypokalemia: Secondary | ICD-10-CM | POA: Diagnosis not present

## 2014-03-27 DIAGNOSIS — I5041 Acute combined systolic (congestive) and diastolic (congestive) heart failure: Secondary | ICD-10-CM

## 2014-03-27 DIAGNOSIS — I214 Non-ST elevation (NSTEMI) myocardial infarction: Secondary | ICD-10-CM

## 2014-03-27 LAB — GLUCOSE, CAPILLARY
GLUCOSE-CAPILLARY: 154 mg/dL — AB (ref 70–99)
GLUCOSE-CAPILLARY: 110 mg/dL — AB (ref 70–99)
Glucose-Capillary: 214 mg/dL — ABNORMAL HIGH (ref 70–99)
Glucose-Capillary: 252 mg/dL — ABNORMAL HIGH (ref 70–99)
Glucose-Capillary: 301 mg/dL — ABNORMAL HIGH (ref 70–99)
Glucose-Capillary: 307 mg/dL — ABNORMAL HIGH (ref 70–99)
Glucose-Capillary: 76 mg/dL (ref 70–99)

## 2014-03-27 LAB — CBC WITH DIFFERENTIAL/PLATELET
BASOS ABS: 0 10*3/uL (ref 0.0–0.1)
BASOS PCT: 0 % (ref 0–1)
EOS ABS: 0 10*3/uL (ref 0.0–0.7)
EOS PCT: 0 % (ref 0–5)
HEMATOCRIT: 28.7 % — AB (ref 39.0–52.0)
Hemoglobin: 9.8 g/dL — ABNORMAL LOW (ref 13.0–17.0)
Lymphocytes Relative: 3 % — ABNORMAL LOW (ref 12–46)
Lymphs Abs: 0.6 10*3/uL — ABNORMAL LOW (ref 0.7–4.0)
MCH: 30.8 pg (ref 26.0–34.0)
MCHC: 34.1 g/dL (ref 30.0–36.0)
MCV: 90.3 fL (ref 78.0–100.0)
MONO ABS: 1.2 10*3/uL — AB (ref 0.1–1.0)
Monocytes Relative: 6 % (ref 3–12)
Neutro Abs: 18.9 10*3/uL — ABNORMAL HIGH (ref 1.7–7.7)
Neutrophils Relative %: 91 % — ABNORMAL HIGH (ref 43–77)
PLATELETS: 310 10*3/uL (ref 150–400)
RBC: 3.18 MIL/uL — ABNORMAL LOW (ref 4.22–5.81)
RDW: 15.2 % (ref 11.5–15.5)
WBC: 20.7 10*3/uL — AB (ref 4.0–10.5)

## 2014-03-27 LAB — CREATININE, URINE, RANDOM: CREATININE, URINE: 19.03 mg/dL

## 2014-03-27 LAB — BASIC METABOLIC PANEL
Anion gap: 14 (ref 5–15)
Anion gap: 14 (ref 5–15)
BUN: 40 mg/dL — AB (ref 6–23)
BUN: 43 mg/dL — ABNORMAL HIGH (ref 6–23)
CALCIUM: 8.2 mg/dL — AB (ref 8.4–10.5)
CO2: 28 mEq/L (ref 19–32)
CO2: 28 meq/L (ref 19–32)
CREATININE: 1.5 mg/dL — AB (ref 0.50–1.35)
Calcium: 8 mg/dL — ABNORMAL LOW (ref 8.4–10.5)
Chloride: 97 mEq/L (ref 96–112)
Chloride: 92 mEq/L — ABNORMAL LOW (ref 96–112)
Creatinine, Ser: 1.4 mg/dL — ABNORMAL HIGH (ref 0.50–1.35)
GFR calc Af Amer: 50 mL/min — ABNORMAL LOW (ref >90)
GFR calc non Af Amer: 43 mL/min — ABNORMAL LOW (ref >90)
GFR, EST AFRICAN AMERICAN: 54 mL/min — AB (ref >90)
GFR, EST NON AFRICAN AMERICAN: 47 mL/min — AB (ref >90)
GLUCOSE: 307 mg/dL — AB (ref 70–99)
Glucose, Bld: 185 mg/dL — ABNORMAL HIGH (ref 70–99)
POTASSIUM: 3.1 meq/L — AB (ref 3.7–5.3)
Potassium: 3.5 mEq/L — ABNORMAL LOW (ref 3.7–5.3)
SODIUM: 134 meq/L — AB (ref 137–147)
Sodium: 139 mEq/L (ref 137–147)

## 2014-03-27 LAB — TYPE AND SCREEN
ABO/RH(D): A POS
ANTIBODY SCREEN: NEGATIVE
Unit division: 0

## 2014-03-27 LAB — CBC
HEMATOCRIT: 27.6 % — AB (ref 39.0–52.0)
Hemoglobin: 9.4 g/dL — ABNORMAL LOW (ref 13.0–17.0)
MCH: 30.8 pg (ref 26.0–34.0)
MCHC: 34.1 g/dL (ref 30.0–36.0)
MCV: 90.5 fL (ref 78.0–100.0)
Platelets: 274 10*3/uL (ref 150–400)
RBC: 3.05 MIL/uL — ABNORMAL LOW (ref 4.22–5.81)
RDW: 15.5 % (ref 11.5–15.5)
WBC: 19.9 10*3/uL — ABNORMAL HIGH (ref 4.0–10.5)

## 2014-03-27 LAB — VANCOMYCIN, TROUGH: VANCOMYCIN TR: 15.8 ug/mL (ref 10.0–20.0)

## 2014-03-27 LAB — PROCALCITONIN: Procalcitonin: 0.71 ng/mL

## 2014-03-27 LAB — HEPARIN LEVEL (UNFRACTIONATED): Heparin Unfractionated: 0.84 IU/mL — ABNORMAL HIGH (ref 0.30–0.70)

## 2014-03-27 LAB — OCCULT BLOOD X 1 CARD TO LAB, STOOL: Fecal Occult Bld: NEGATIVE

## 2014-03-27 LAB — SODIUM, URINE, RANDOM: SODIUM UR: 65 meq/L

## 2014-03-27 MED ORDER — POTASSIUM CHLORIDE CRYS ER 20 MEQ PO TBCR
40.0000 meq | EXTENDED_RELEASE_TABLET | Freq: Once | ORAL | Status: AC
  Administered 2014-03-27: 40 meq via ORAL
  Filled 2014-03-27: qty 2

## 2014-03-27 MED ORDER — LISINOPRIL 20 MG PO TABS
20.0000 mg | ORAL_TABLET | Freq: Every day | ORAL | Status: DC
  Administered 2014-03-28 – 2014-04-03 (×7): 20 mg via ORAL
  Filled 2014-03-27 (×8): qty 1

## 2014-03-27 MED ORDER — CARVEDILOL 3.125 MG PO TABS
3.1250 mg | ORAL_TABLET | Freq: Two times a day (BID) | ORAL | Status: DC
  Administered 2014-03-28 – 2014-04-05 (×16): 3.125 mg via ORAL
  Filled 2014-03-27 (×20): qty 1

## 2014-03-27 MED ORDER — FUROSEMIDE 10 MG/ML IJ SOLN
60.0000 mg | Freq: Once | INTRAMUSCULAR | Status: AC
  Administered 2014-03-27: 60 mg via INTRAVENOUS
  Filled 2014-03-27: qty 6

## 2014-03-27 MED ORDER — INSULIN GLARGINE 100 UNIT/ML ~~LOC~~ SOLN
10.0000 [IU] | Freq: Every day | SUBCUTANEOUS | Status: DC
  Administered 2014-03-27 – 2014-04-05 (×10): 10 [IU] via SUBCUTANEOUS
  Filled 2014-03-27 (×11): qty 0.1

## 2014-03-27 MED ORDER — HEPARIN (PORCINE) IN NACL 100-0.45 UNIT/ML-% IJ SOLN
1300.0000 [IU]/h | INTRAMUSCULAR | Status: DC
  Administered 2014-03-27: 1500 [IU]/h via INTRAVENOUS
  Filled 2014-03-27 (×2): qty 250

## 2014-03-27 NOTE — Progress Notes (Addendum)
     Subjective  - Alert and oriented. He is sitting up eating in the chair.  Very pleasant.   Objective 97/54 71 97.3 F (36.3 C) (Oral) 22 100%  Intake/Output Summary (Last 24 hours) at 03/27/14 0907 Last data filed at 03/27/14 0700  Gross per 24 hour  Intake 2778.75 ml  Output   3975 ml  Net -1196.25 ml    Doppler PT left great  toe appears normal in color Lungs 55% venti mask, unlabored breathing Heart RRR  Assessment/Planning: S/p IR thrombolysis LLE By-pass open with doppler signals Cardiology NSTEMI CHF work up in progress Respiratory failure on 55% ventilator mask Anemia improved post transfusion of 1 unit of PRBC    COLLINS, EMMA MAUREEN 03/27/2014 9:07 AM --  Laboratory Lab Results:  Recent Labs  03/26/14 0430 03/27/14 0500  WBC 9.6 19.9*  HGB 6.7* 9.4*  HCT 19.9* 27.6*  PLT 199 274   BMET  Recent Labs  03/26/14 1315 03/27/14 0500  NA 136* 139  K 3.9 3.1*  CL 100 97  CO2 23 28  GLUCOSE 122* 185*  BUN 32* 40*  CREATININE 1.03 1.40*  CALCIUM 8.4 8.2*    COAG Lab Results  Component Value Date   INR 1.17 03/21/2014   No results found for this basename: PTT   Agree with above assessment Left leg with patent femoral popliteal bypass. 3+ popliteal graft pulse and 3+ posterior tibial pulse palpable. Hemoglobin today 9.4 g Continued medical management for acute diastolic heart failure and non-STEMI  Dr. Kendrick Fries has agreed to take patient in transfer I will see patient 4 weeks following discharge with duplex scan of his bypass in the office and will leave decision regarding chronic anti-coagulation up to CCM

## 2014-03-27 NOTE — Progress Notes (Signed)
ANTIBIOTIC CONSULT NOTE - FOLLOW UP  Pharmacy Consult for vancomycin and Zosyn Indication: pneumonia  Allergies  Allergen Reactions  . Prednisone Hives  . Omnipaque [Iohexol] Itching and Rash    Patient Measurements: Height: 5\' 4"  (162.6 cm) Weight: 125 lb 14.1 oz (57.1 kg) IBW/kg (Calculated) : 59.2  Vital Signs: Temp: 97.2 F (36.2 C) (08/05 1219) Temp src: Oral (08/05 1219) BP: 103/39 mmHg (08/05 1000) Pulse Rate: 89 (08/05 1000) Intake/Output from previous day: 08/04 0701 - 08/05 0700 In: 3118.5 [P.O.:1920; I.V.:323.5; Blood:400; IV Piggyback:475] Out: 3975 [Urine:3975] Intake/Output from this shift: Total I/O In: 775 [P.O.:720; I.V.:30; IV Piggyback:25] Out: -   Labs:  Recent Labs  03/26/14 0430 03/26/14 1315 03/27/14 0500 03/27/14 1200  WBC 9.6  --  19.9* 20.7*  HGB 6.7*  --  9.4* 9.8*  PLT 199  --  274 310  CREATININE 1.02 1.03 1.40* 1.50*   Estimated Creatinine Clearance: 32.8 ml/min (by C-G formula based on Cr of 1.5).  Recent Labs  03/27/14 1230  VANCOTROUGH 15.8     Microbiology: Recent Results (from the past 720 hour(s))  MRSA PCR SCREENING     Status: None   Collection Time    03/22/14  9:35 PM      Result Value Ref Range Status   MRSA by PCR NEGATIVE  NEGATIVE Final   Comment:            The GeneXpert MRSA Assay (FDA     approved for NASAL specimens     only), is one component of a     comprehensive MRSA colonization     surveillance program. It is not     intended to diagnose MRSA     infection nor to guide or     monitor treatment for     MRSA infections.    Anti-infectives   Start     Dose/Rate Route Frequency Ordered Stop   03/26/14 1400  piperacillin-tazobactam (ZOSYN) IVPB 3.375 g     3.375 g 12.5 mL/hr over 240 Minutes Intravenous Every 8 hours 03/26/14 1152     03/26/14 0100  vancomycin (VANCOCIN) 500 mg in sodium chloride 0.9 % 100 mL IVPB     500 mg 100 mL/hr over 60 Minutes Intravenous Every 12 hours 03/25/14 1207      03/25/14 1300  vancomycin (VANCOCIN) IVPB 1000 mg/200 mL premix     1,000 mg 200 mL/hr over 60 Minutes Intravenous  Once 03/25/14 1207 03/25/14 1408   03/25/14 1200  levofloxacin (LEVAQUIN) IVPB 750 mg  Status:  Discontinued     750 mg 100 mL/hr over 90 Minutes Intravenous Every 24 hours 03/25/14 1100 03/27/14 0931   03/25/14 1100  piperacillin-tazobactam (ZOSYN) IVPB 3.375 g  Status:  Discontinued     3.375 g 12.5 mL/hr over 240 Minutes Intravenous Every 8 hours 03/25/14 1052 03/26/14 1152   03/22/14 0600  ceFAZolin (ANCEF) IVPB 1 g/50 mL premix     1 g 100 mL/hr over 30 Minutes Intravenous  Once 03/21/14 1808 03/22/14 0847      Assessment: 80 y/o male with acute occlusion of left femoral-below-knee popliteal bypass s/p lysis with TNK. He has had episodes of hemoptysis.Pharmacy managing vancomycin and Zosyn for PNA. He is on day 3 vancomycin and Zosyn for PNA. SCr bumped up today possible from diuresis. Vancomycin trough is therapeutic at 15.8. If renal function worsens may have to adjust dose. He is afebrile but WBC are elevated (previously on steroids).  Goal of  Therapy:  Vancomycin trough level 15-20 mcg/ml Eradication of infection  Plan:  - Continue vancomycin 500 mg IV q12h - Continue Zosyn 3.375 g IV q8h (infused over 4 hrs) - Monitor renal function and clinical progress  [facility], [address].D., BCPS Clinical Pharmacist Pager: [phone] 03/27/2014 1:25 PM

## 2014-03-27 NOTE — Progress Notes (Signed)
ANTICOAGULATION CONSULT NOTE - Follow Up Consult  Pharmacy Consult for Heparin Indication: NSTEMI; s/p aortogram and thrombolysis of LLE bypass   Allergies  Allergen Reactions  . Prednisone Hives  . Omnipaque [Iohexol] Itching and Rash    Patient Measurements: Height: 5\' 4"  (162.6 cm) Weight: 125 lb 14.1 oz (57.1 kg) IBW/kg (Calculated) : 59.2 Heparin Dosing Weight: 59.6 kg  Vital Signs: Temp: 97.3 F (36.3 C) (08/05 0823) Temp src: Oral (08/05 0823) BP: 97/54 mmHg (08/05 0600) Pulse Rate: 71 (08/05 0700)  Labs:  Recent Labs  03/24/14 1203  03/24/14 1800  03/25/14 0249  03/26/14 0430 03/26/14 1315 03/26/14 1613 03/26/14 1913 03/27/14 0500  HGB 8.3*  --   --   --  7.9*  --  6.7*  --   --   --  9.4*  HCT 25.1*  --   --   --  24.0*  --  19.9*  --   --   --  27.6*  PLT 279  --   --   --  270  --  199  --   --   --  274  HEPARINUNFRC <0.10*  --  <0.10*  --  0.39  --   --   --   --   --   --   CREATININE  --   --   --   < > 1.00  --  1.02 1.03  --   --  1.40*  TROPONINI  --   < >  --   < > <0.30  < >  --  0.49* 0.41* 0.42*  --   < > = values in this interval not displayed.  Estimated Creatinine Clearance: 35.1 ml/min (by C-G formula based on Cr of 1.4).  Assessment: 80 y/o male with acute occlusion of left femoral-below-knee popliteal bypass s/p lysis with TNK. He has had episodes of intermittent hemoptysis and his heparin drip was stopped 8/3. Pharmacy consulted to resume for NSTEMI with no bolus. Will target low end of goal to minimize bleeding as much as possible.  Last heparin level was 0.39 on 1700 units/hr (29.8 units/kg/hr). Concerned this rate is high for his weight and with ongoing hemoptysis will resume at lower rate. Hb low but improved s/p transfusion. Also spoke with RN who is calling IV team to try and get a peripheral IV started.  Goal of Therapy:  Heparin level 0.3-0.5 units/ml with previous hemoptysis Monitor platelets by anticoagulation protocol: Yes   Plan:   Resume heparin drip at 1500 units/hr with no bolus  8 hr heparin level  Daily heparin level and CBC  Monitor for s/sx of bleeding  [facility], [place].D., BCPS Clinical Pharmacist Pager: [phone] 03/27/2014 10:51 AM

## 2014-03-27 NOTE — Progress Notes (Signed)
NUTRITION FOLLOW UP  INTERVENTION:  Continue Glucerna Shake po TID, each supplement provides 220 kcal and 10 grams of protein RD to follow for nutrition care plan  NUTRITION DIAGNOSIS: Inadequate oral intake related to COPD as evidenced by weight loss, ongoing  Goal: Pt to meet >/= 90% of their estimated nutrition needs, progressing  Monitor:  PO & supplemental intake, weight, labs, I/O's  ASSESSMENT: 80 y.o. male with prior hx of PAD with LLE Fem-below knee popliteal bypass graft with saphenous vein. Was originally done 1999, per pt, has had to have open thrombectomy at least once in the past. Developed acute onset of pain in foot with coldness and bluish discoloration of toes. Has been admitted by [facility] and started on heparin drip.  7/31: -Pt reports that he has not been eating well due to COPD.  - He says that he has had a 12 lb weight loss in the past 6 months.  - Pt says that he is starting to feel hungry at this time. - He drinks Glucerna Shakes at home occasionally.  - Pt with signs of moderate muscle wasting around the temples.   8/5:  Patient currently on Venturi mask.  S/p LLE arterial lysis of thrombus 8/2.  Reports a good appetite.  PO intake 50-100% per flowsheet records.  Drinking his Glucerna Shakes.  Height: Ht Readings from Last 1 Encounters:  03/21/14 5\' 4"  (1.626 m)    Weight: Wt Readings from Last 1 Encounters:  03/27/14 125 lb 14.1 oz (57.1 kg)    BMI:  Body mass index is 21.6 kg/(m^2).  Estimated Nutritional Needs: Kcal: 1800-2100 Protein: 100-110 g Fluid: 1.8-2.1 L/day  Skin: Intact  Diet Order: Heart Healthy/Carbohydrate Modified    Intake/Output Summary (Last 24 hours) at 03/27/14 1317 Last data filed at 03/27/14 1000  Gross per 24 hour  Intake   2286 ml  Output   3725 ml  Net  -1439 ml    Labs:   Recent Labs Lab 03/26/14 1315 03/27/14 0500 03/27/14 1200  NA 136* 139 134*  K 3.9 3.1* 3.5*  CL 100 97 92*  CO2 23 28 28   BUN 32*  40* 43*  CREATININE 1.03 1.40* 1.50*  CALCIUM 8.4 8.2* 8.0*  GLUCOSE 122* 185* 307*    CBG (last 3)   Recent Labs  03/27/14 0821 03/27/14 1213 03/27/14 1217  GLUCAP 154* 307* 301*    Scheduled Meds: . aspirin EC  325 mg Oral Daily  . atorvastatin  80 mg Oral q1800  . docusate sodium  100 mg Oral BID  . feeding supplement (GLUCERNA SHAKE)  237 mL Oral TID BM  . furosemide  60 mg Intravenous Once  . insulin aspart  0-15 Units Subcutaneous 6 times per day  . insulin glargine  10 Units Subcutaneous Daily  . lisinopril  40 mg Oral Daily  . multivitamin with minerals  1 tablet Oral Daily  . pantoprazole  40 mg Oral Daily  . piperacillin-tazobactam (ZOSYN)  IV  3.375 g Intravenous Q8H  . potassium chloride  20-40 mEq Oral Once  . sodium chloride  10-40 mL Intracatheter Q12H  . sodium chloride  3 mL Intravenous Q12H  . vancomycin  500 mg Intravenous Q12H    Continuous Infusions: . heparin 1,500 Units/hr (03/27/14 1317)    Past Medical History  Diagnosis Date  . Diabetes mellitus   . Hyperlipidemia   . Hypertension   . Anemia   . COPD (chronic obstructive pulmonary disease)   . Peripheral  vascular disease     Past Surgical History  Procedure Laterality Date  . Pr vein bypass graft,aorto-fem-pop      Maureen Chatters, RD, LDN Pager #: [phone] After-Hours Pager #: [phone]

## 2014-03-27 NOTE — Progress Notes (Signed)
ANTICOAGULATION CONSULT NOTE - Follow Up Consult  Pharmacy Consult for Heparin Indication: NSTEMI; s/p aortogram and thrombolysis of LLE bypass   Allergies  Allergen Reactions  . Prednisone Hives  . Omnipaque [Iohexol] Itching and Rash    Patient Measurements: Height: 5\' 4"  (162.6 cm) Weight: 125 lb 14.1 oz (57.1 kg) IBW/kg (Calculated) : 59.2 Heparin Dosing Weight: 59.6 kg  Vital Signs: Temp: 97.9 F (36.6 C) (08/05 1957) Temp src: Oral (08/05 1957) BP: 131/53 mmHg (08/05 2000) Pulse Rate: 87 (08/05 2100)  Labs:  Recent Labs  03/25/14 0249  03/26/14 0430 03/26/14 1315 03/26/14 1613 03/26/14 1913 03/27/14 0500 03/27/14 1200 03/27/14 2100  HGB 7.9*  --  6.7*  --   --   --  9.4* 9.8*  --   HCT 24.0*  --  19.9*  --   --   --  27.6* 28.7*  --   PLT 270  --  199  --   --   --  274 310  --   HEPARINUNFRC 0.39  --   --   --   --   --   --   --  0.84*  CREATININE 1.00  --  1.02 1.03  --   --  1.40* 1.50*  --   TROPONINI <0.30  < >  --  0.49* 0.41* 0.42*  --   --   --   < > = values in this interval not displayed.  Estimated Creatinine Clearance: 32.8 ml/min (by C-G formula based on Cr of 1.5).  Assessment: 80 y/o male with acute occlusion of left femoral-below-knee popliteal bypass s/p lysis with TNK. He has had episodes of intermittent hemoptysis and his heparin drip was stopped 8/3. Pharmacy consulted to resume for NSTEMI with no bolus. Will target low end of goal to minimize bleeding.  Last heparin level was 0.39 therapeutic on 1700 units/hr, now on 1500 units/hr, heparin level is supratherapeutic.  Hgb/hct 6.7 > 9.8, plts are stable, renal function is worsening SCr 1 > 1.5.  Goal of Therapy:  Heparin level 0.3-0.5 units/ml with previous hemoptysis Monitor platelets by anticoagulation protocol: Yes   Plan:   Decrease heparin drip to 1300 units/hr  Daily heparin level and CBC  Monitor closely for s/sx of bleeding   Monitor renal function   Agapito Games,  PharmD, BCPS Clinical Pharmacist Pager: [phone] 03/27/2014 10:09 PM

## 2014-03-27 NOTE — Progress Notes (Signed)
Subjective: Feeling better.  Still SOB  Objective: Vital signs in last 24 hours: Temp:  [96 F (35.6 C)-98.1 F (36.7 C)] 97.3 F (36.3 C) (08/05 0823) Pulse Rate:  [71-101] 89 (08/05 1000) Resp:  [17-36] 27 (08/05 1000) BP: (97-152)/(36-74) 103/39 mmHg (08/05 1000) SpO2:  [87 %-100 %] 87 % (08/05 1000) FiO2 (%):  [55 %] 55 % (08/05 0600) Weight:  [125 lb 14.1 oz (57.1 kg)] 125 lb 14.1 oz (57.1 kg) (08/05 0600) Last BM Date: 03/26/14  Intake/Output from previous day: 08/04 0701 - 08/05 0700 In: 3118.5 [P.O.:1920; I.V.:323.5; Blood:400; IV Piggyback:475] Out: 3975 [Urine:3975] Intake/Output this shift: Total I/O In: 775 [P.O.:720; I.V.:30; IV Piggyback:25] Out: -   Medications Current Facility-Administered Medications  Medication Dose Route Frequency Provider Last Rate Last Dose  . 0.9 %  sodium chloride infusion  250 mL Intravenous PRN Art A Hoss, MD 10 mL/hr at 03/27/14 0400 250 mL at 03/27/14 0400  . acetaminophen (TYLENOL) tablet 325-650 mg  325-650 mg Oral Q4H PRN Raymond GurneyKimberly A Trinh, PA-C       Or  . acetaminophen (TYLENOL) suppository 325-650 mg  325-650 mg Rectal Q4H PRN Raymond GurneyKimberly A Trinh, PA-C      . albuterol (PROVENTIL) (2.5 MG/3ML) 0.083% nebulizer solution 2.5 mg  2.5 mg Nebulization Q6H PRN Sherren Kernsharles E Fields, MD   2.5 mg at 03/25/14 [id number]  . alum & mag hydroxide-simeth (MAALOX/MYLANTA) 200-200-20 MG/5ML suspension 15-30 mL  15-30 mL Oral Q2H PRN Raymond GurneyKimberly A Trinh, PA-C      . aspirin EC tablet 325 mg  325 mg Oral Daily Art A Hoss, MD   325 mg at 03/27/14 1002  . atorvastatin (LIPITOR) tablet 80 mg  80 mg Oral q1800 Raymond GurneyKimberly A Trinh, PA-C   80 mg at 03/26/14 1726  . benzonatate (TESSALON) capsule 100 mg  100 mg Oral TID PRN Lupita Leashouglas B McQuaid, MD   100 mg at 03/23/14 1639  . bisacodyl (DULCOLAX) suppository 10 mg  10 mg Rectal Daily PRN Raymond GurneyKimberly A Trinh, PA-C      . diphenhydrAMINE (BENADRYL) injection 12.5 mg  12.5 mg Intravenous Q6H PRN Simonne MartinetPeter E Babcock, NP   12.5 mg  at 03/26/14 2210  . docusate sodium (COLACE) capsule 100 mg  100 mg Oral BID Raymond GurneyKimberly A Trinh, PA-C   100 mg at 03/27/14 1003  . feeding supplement (GLUCERNA SHAKE) (GLUCERNA SHAKE) liquid 237 mL  237 mL Oral TID BM Earna CoderHaley A Hawkins, RD   237 mL at 03/27/14 1023  . furosemide (LASIX) 250 mg in dextrose 5 % 250 mL (1 mg/mL) infusion  10 mg/hr Intravenous Continuous Lars MassonKatarina H Nelson, MD 10 mL/hr at 03/27/14 0400 10 mg/hr at 03/27/14 0400  . guaiFENesin-dextromethorphan (ROBITUSSIN DM) 100-10 MG/5ML syrup 15 mL  15 mL Oral Q4H PRN Raymond GurneyKimberly A Trinh, PA-C      . heparin ADULT infusion 100 units/mL (25000 units/250 mL)  1,500 Units/hr Intravenous Continuous [facility] CenterJennifer Danielle [place], [facility] Rehabilitation CenterRPH      . hydrALAZINE (APRESOLINE) injection 10 mg  10 mg Intravenous Q2H PRN Raymond GurneyKimberly A Trinh, PA-C      . insulin aspart (novoLOG) injection 0-15 Units  0-15 Units Subcutaneous 6 times per day Lupita Leashouglas B McQuaid, MD   3 Units at 03/27/14 [phone]  . insulin glargine (LANTUS) injection 10 Units  10 Units Subcutaneous Daily Lupita Leashouglas B McQuaid, MD   10 Units at 03/27/14 1029  . labetalol (NORMODYNE,TRANDATE) injection 10 mg  10 mg Intravenous Q2H PRN Raymond GurneyKimberly A Trinh,  PA-C      . lisinopril (PRINIVIL,ZESTRIL) tablet 40 mg  40 mg Oral Daily Raymond Gurney, PA-C   40 mg at 03/27/14 1002  . metoprolol (LOPRESSOR) injection 2-5 mg  2-5 mg Intravenous Q2H PRN Raymond Gurney, PA-C   2.5 mg at 03/22/14 1602  . midazolam (VERSED) injection 1 mg  1 mg Intravenous Q1H PRN Art A Hoss, MD      . morphine 4 MG/ML injection 5 mg  5 mg Intravenous Q1H PRN Art A Hoss, MD      . multivitamin with minerals tablet 1 tablet  1 tablet Oral Daily Raymond Gurney, PA-C   1 tablet at 03/27/14 1002  . ondansetron (ZOFRAN) injection 4 mg  4 mg Intravenous Q6H PRN Art A Hoss, MD      . oxyCODONE (Oxy IR/ROXICODONE) immediate release tablet 5-10 mg  5-10 mg Oral Q4H PRN Raymond Gurney, PA-C   5 mg at 03/26/14 2210  . pantoprazole (PROTONIX) EC tablet 40  mg  40 mg Oral Daily Raymond Gurney, PA-C   40 mg at 03/27/14 1003  . phenol (CHLORASEPTIC) mouth spray 1 spray  1 spray Mouth/Throat PRN Raymond Gurney, PA-C      . piperacillin-tazobactam (ZOSYN) IVPB 3.375 g  3.375 g Intravenous Q8H Pryor Ochoa, MD   3.375 g at 03/27/14 0600  . potassium chloride SA (K-DUR,KLOR-CON) CR tablet 20-40 mEq  20-40 mEq Oral Once [employer], PA-C      . sodium chloride 0.9 % injection 10-40 mL  10-40 mL Intracatheter Q12H Sherren Kerns, MD   10 mL at 03/26/14 2200  . sodium chloride 0.9 % injection 10-40 mL  10-40 mL Intracatheter PRN Sherren Kerns, MD      . sodium chloride 0.9 % injection 3 mL  3 mL Intravenous Q12H Art A Hoss, MD   3 mL at 03/25/14 2200  . sodium chloride 0.9 % injection 3 mL  3 mL Intravenous PRN Art A Hoss, MD   3 mL at 03/25/14 1806  . vancomycin (VANCOCIN) 500 mg in sodium chloride 0.9 % 100 mL IVPB  500 mg Intravenous Q12H [facility] [place], RPH   500 mg at 03/27/14 0100    PE: General appearance: alert, cooperative and no distress Lungs: Bilateral rales.  No wheezing.  Heart: regular rate and rhythm, S1, S2 normal, no murmur, click, rub or gallop Abdomen: +BS soft, nontender, no distension   Extremities: No LEE Pulses: 2+ radials Skin: Warm and dry. Neurologic: Grossly normal  Lab Results:   Recent Labs  03/25/14 0249 03/26/14 0430 03/27/14 0500  WBC 17.6* 9.6 19.9*  HGB 7.9* 6.7* 9.4*  HCT 24.0* 19.9* 27.6*  PLT 270 199 274   BMET  Recent Labs  03/26/14 0430 03/26/14 1315 03/27/14 0500  NA 138 136* 139  K 4.3 3.9 3.1*  CL 104 100 97  CO2 25 23 28   GLUCOSE 101* 122* 185*  BUN 27* 32* 40*  CREATININE 1.02 1.03 1.40*  CALCIUM 8.2* 8.4 8.2*    Assessment/Plan   Active Problems:   Occlusion of left femoropopliteal bypass graft  SP arterial lysis and PTA    Acute respiratory failure with hypoxia Started on lasix drip yesterday 10mg /hr.  Net fluids: -0.9L/+0.6L.  BNP 12K.   BUN and SCr  increased from 27/1.02 >>40/1.40.   Also on ABx for suspected PNA.  WBCs elevated.  CXR improved.   He does not look grossly  volume overloaded.  Will keep lasix at 10mg /hr and reassess this afternoon.   He is on and ACE.   Echo:  EF 30-35%, grade two diastolic dysfunction.  Global hypokinesis with akinesis of the anterior wall.    NSTEMI (non-ST elevated myocardial infarction) Troponin peaked at 0.94 and last was 0.42.  Likley demand ischemia with Hgb 6.7.  He has never had a coronary angiogram.  ASA, lipitor.  Will need Left heart cath when stable.  BP will not tolerate BB right now.  Consider decreasing ACE and adding one.   He may have had a short run of Afib.  Mostly sinus rhythm LBBB, PACs, PVCs.    LBBB    Hemoptysis  Heparin restarted.  Monitor Hgb.     COPD (chronic obstructive pulmonary disease)      Hypokalemia  Replaced   Anemia  SP one unit PRBCs.  Hgb up from 6.7 to 9.4.     HTN  Lisinopril 40.  BP stable.       LOS: 6 days    Trevor Morris, BRYAN PA-C 03/27/2014 11:18 AM  Patient examined. He is breathing better now. No acute dyspnea. Chest reveals good aeration bilaterally with minimal rales at bases.  Heart no gallop. Heart no gallop. BP still soft.  Will decrease lisinopril to allow [place] to add carvedilol.

## 2014-03-27 NOTE — Progress Notes (Signed)
[facility] ADULT ICU REPLACEMENT PROTOCOL FOR AM LAB REPLACEMENT ONLY  The patient does not apply for the [facility] Adult ICU Electrolyte Replacment Protocol based on the criteria listed below:     Abnormal electrolyte(s): K3.1   Unable to replace due to existing order.   If a panic level lab has been reported, has the CCM MD in charge been notified? Yes.  .   Physician:  Molli Knock, MD    Melrose Nakayama 03/27/2014 6:50 AM

## 2014-03-27 NOTE — Progress Notes (Deleted)
PULMONARY / CRITICAL CARE MEDICINE  Name: Trevor Morris MRN: [id number] DOB: 01-31-1934    ADMISSION DATE:  03/21/2014 CONSULTATION DATE: Caroleen Hamman  REFERRING MD :  Oneida Alar   CHIEF COMPLAINT:  Acute hypoxic respiratory failure   INITIAL PRESENTATION:  80 year old male w/ known h/o CM allergy. Underwent angiography and TNK administration for lysis of distal clot at his by-pass graft site on 7/31. His post-op course was complicated by episodes of scant hemoptysis. First noted 8/1 and continued so TNK was eventually stopped. He went to arteriogram 3 times total between 7/31 and 8/2. On 8/2 he began to have significant increase in dyspnea and by the time he returned to the SICU after angiogram required 100% NRB. He received lasix and supportive care. PCCM asked to see 8/3 as symptoms had continued to worsen.   STUDIES:  7/31: arteriogram: Pelvic angiography demonstrates atherosclerotic plaque at the origin of the right and left common iliac arteries without narrowing greater than 50%. The left external iliac artery is patent. Successful placement of an infusion catheter into a thrombosed left femoral popliteal artery bypass graft. TNK was instituted at 0.5 milligrams/hour 8/1: arteriogram: improvement with near complete lysed cysts of the saphenous vein graft. There continues to be occlusive thrombus distally. The plan is for an additional 24 hr of lysis with a follow-up check 8/2 8/2 third trip to angiogram: demonstrates resolution of the thrombus throughout the fem-pop venous bypass graft but 2 areas of narrowing distally, at the distal anastomosis, and within the tibioperoneal trunk.  8/3 CT chest: 1. Extensive bilateral acute interstitial lung disease superimposed on marked changes of COPD. (pulmonary edema vs viral pneumonitis). Small right pleural effusion. Mild mediastinal adenopathy. Dense coronary artery atheromatous calcifications. There is also diffuse distal esophageal wall thickening. This  could be due to incomplete distention, esophagitis or mass. These possibilities could be differentiated with direct endoscopic visualization 8/4/ Echo : LVEF 30-35% with mild pulmonary HTN, grade 2 diastolic dysfunction in LV, ventricle septum motion paradoxical, left atrium dilation, right ventricle mildly reduced systolic function, pulmonary arteries mildly increased systolic pressure 8/5 CXR: Suspect congestive heart failure superimposed on chronic interstitial fibrosis and emphysema.Possible underlying infectious pneumonia as well. The appearance is stable compared to 2 days prior.    SIGNIFICANT EVENTS: 7/30 admitted for clotted fem-pop bypass graft.  7/31: went to IR for angiogram and TNK.  8/1, 230 p: TNK stopped due to scant hemoptysis. Resumed at 5p. 1/2 dose  8/1 630: hemoptysis returned and was not resumed. Heparin continued.  8/2: increased shortness of breath.  8/2: placed on 100% NRB after returning from angiogram.  8/3 PCCM called. Still very hypoxic on 100% NRB. Had episode where sats dropped to 70s. Heparin stopped due to some episode of hemoptysis.  8/4Warren Lacy called for K of 3.1  LINES/TUBES: PICC 8/3>>  INTERVAL HISTORY:  Nurse: Pt was able to ambulate to the chair last night but still desats with movement and talking. He was able to have a non-bloody BM. Weight is down from 133 to 125lbs. Warren Lacy was called for low K, which could not be supplemented due to lasix and ABX drip lines.  Patient: Pt reports he slept horrible and would like another bed. He did cough up a small amount of blood but feels much better. Abdominal pain has resolved and he is feeling less SOB. Condones he cannot remember what the cardiologist told him yesterday but would like to get home as soon as possible.   VITAL SIGNS:  Temp:  [96 F (35.6 C)-98.1 F (36.7 C)] 97.8 F (36.6 C) (08/05 0400) Pulse Rate:  [71-101] 71 (08/05 0700) Resp:  [17-36] 22 (08/05 0700) BP: (97-152)/(36-74) 97/54 mmHg  (08/05 0600) SpO2:  [94 %-100 %] 100 % (08/05 0700) FiO2 (%):  [55 %] 55 % (08/05 0600) Weight:  [125 lb 14.1 oz (57.1 kg)] 125 lb 14.1 oz (57.1 kg) (08/05 0600) 100%   HEMODYNAMICS: CVP:  [0 mmHg-4 mmHg] 4 mmHg VENTILATOR SETTINGS: Vent Mode:  [-]  FiO2 (%):  [55 %] 55 % INTAKE / OUTPUT:  Intake/Output Summary (Last 24 hours) at 03/27/14 0722 Last data filed at 03/27/14 0700  Gross per 24 hour  Intake 3118.48 ml  Output   3975 ml  Net -856.52 ml    PHYSICAL EXAMINATION: General:  80 year old male, currently on 100% NRB Neuro:  Awake, oriented no focal def  HEENT:  No lymphadenopathy, PERRL, EOMs intact, no JVD Cardiovascular:  Tachy with LBBB with no M/R/G, posterior tibilas pulses +2 Lungs:  Fine crackles in LL bilaterally with coarseness in RUL Abdomen:  Soft, no tenderness to palpation Musculoskeletal:  Intact  Skin:  Intact without edema  LABS:  CBC  Recent Labs Lab 03/25/14 0249 03/26/14 0430 03/27/14 0500  WBC 17.6* 9.6 19.9*  HGB 7.9* 6.7* 9.4*  HCT 24.0* 19.9* 27.6*  PLT 270 199 274   Coag's  Recent Labs Lab 03/21/14 1929  INR 1.17   BMET  Recent Labs Lab 03/26/14 0430 03/26/14 1315 03/27/14 0500  NA 138 136* 139  K 4.3 3.9 3.1*  CL 104 100 97  CO2 25 23 28   BUN 27* 32* 40*  CREATININE 1.02 1.03 1.40*  GLUCOSE 101* 122* 185*   Electrolytes  Recent Labs Lab 03/26/14 0430 03/26/14 1315 03/27/14 0500  CALCIUM 8.2* 8.4 8.2*   Sepsis Markers  Recent Labs Lab 03/25/14 1035 03/26/14 0430 03/27/14 0500  PROCALCITON 0.50 0.79 0.71    ABG  Recent Labs Lab 03/24/14 1306 03/25/14 0830  PHART 7.427 7.407  PCO2ART 32.8* 36.3  PO2ART 97.0 67.8*   Liver Enzymes  Recent Labs Lab 03/21/14 1929  AST 20  ALT 16  ALKPHOS 88  BILITOT 0.3  ALBUMIN 3.2*   Cardiac Enzymes  Recent Labs Lab 03/25/14 0249 03/25/14 1034  03/26/14 0430 03/26/14 1315 03/26/14 1613 03/26/14 1913  TROPONINI <0.30 <0.30  < >  --  0.49* 0.41*  0.42*  PROBNP  --  9493.0*  --  12292.0*  --   --   --   < > = values in this interval not displayed. Glucose  Recent Labs Lab 03/26/14 1219 03/26/14 1323 03/26/14 1618 03/26/14 1919 03/26/14 2344 03/27/14 0349  GLUCAP 131* 124* 190* 202* 97 214*   Imaging No results found. ASSESSMENT / PLAN: PULMONOLOGY: A: Acute hypoxemic respiratory failure  CT suggests baseline ILD and emphysema, he reports history of neither Acute process is either pulm edema, alveolar hemorrhage (received TNK), or pneumonia, less likely acute inflammatory (primary pulmonary) process or ARDS PCT trending down Hemoptysis P: Goal SpO2>92 Supplemental oxygen ABX broad spectrum ( see infectious)- sputum culture to narrow Hold steroids today Lasix today Hold anticoagulants Bronchodilators PRN Obtain baseline CXR interpretation from PCP  CARDIOLOGY: A: NSTEMI, LBBB, tachycardia,and PAC's on EKG Decompensated Systolic heart failure- EF 30-35%-  BNP- trending down Hx: HTN CVP unhelpful through PICC Hx: Hyperlipidemia P: Continue Lasix per cardiology Cardiology will follow  INFECTIOUS: A: Increased ESR- 60 Leukocytosis P: Levaquin 8/3>> day 1/x  Zosyn 8/3 >> day 1/x Vanco 8/3 >>day 1/x Sputum Cx 8/5>>  HEMATOLOGY: A: Anemia, acute on chronic disease( baseline 10.3) > due to [facility]? No clear gi loss, esophageal lesion on CT Hypercoagulability  Aspirin and Heparin   P: Transfusion this morning 1 unit- Hgb 6.7 Follow Coag panel  Hemoccult stool  RENAL: A: No acute issues Hyopkalemia secondary to Lasix P: BMET  Supp K  GI: A: Distal esophageal wall thickening- ??incomplete distension, esophagitis, mass Glycemic control order set Constipation P: Consult GI for possible endoscopy after oxygenation improves Hemoccult blood Diabetes consultation recommends Glycemic control order set Stool softeners PRN  ENDOCRINE: A: Hx DM P: SSI  GLOBAL: Pt would want to intubated if the  necessity arises  Dominga Ferry, PA-S Pulmonary and [place] Pager: [phone]   Attending:  I have seen and examined the patient with nurse practitioner/resident and agree with and have edited the note above.   CC time 33 minutes  Roselie Awkward, MD [place] [facility] Pager: [phone] Cell: [phone] If no response, call [phone]    03/27/2014, 7:22 AM

## 2014-03-27 NOTE — Progress Notes (Signed)
PULMONARY / CRITICAL CARE MEDICINE  Name: Trevor Morris MRN: [id number] DOB: 07/20/1934    ADMISSION DATE:  03/21/2014 CONSULTATION DATE: Lillia Mountain8/3  REFERRING MD :  Darrick PennaFields   CHIEF COMPLAINT:  Acute hypoxic respiratory failure   INITIAL PRESENTATION:  80 year old male w/ known h/o CM allergy. Underwent angiography and TNK administration for lysis of distal clot at his by-pass graft site on 7/31. His post-op course was complicated by episodes of scant hemoptysis. First noted 8/1 and continued so TNK was eventually stopped. He went to arteriogram 3 times total between 7/31 and 8/2. On 8/2 he began to have significant increase in dyspnea and by the time he returned to the SICU after angiogram required 100% NRB. He received lasix and supportive care. PCCM asked to see 8/3 as symptoms had continued to worsen.   STUDIES:  7/31: arteriogram: Pelvic angiography demonstrates atherosclerotic plaque at the origin of the right and left common iliac arteries without narrowing greater than 50%. The left external iliac artery is patent. Successful placement of an infusion catheter into a thrombosed left femoral popliteal artery bypass graft. TNK was instituted at 0.5 milligrams/hour 8/1: arteriogram: improvement with near complete lysed cysts of the saphenous vein graft. There continues to be occlusive thrombus distally. The plan is for an additional 24 hr of lysis with a follow-up check 8/2 8/2 third trip to angiogram: demonstrates resolution of the thrombus throughout the fem-pop venous bypass graft but 2 areas of narrowing distally, at the distal anastomosis, and within the tibioperoneal trunk.  8/3 CT chest: 1. Extensive bilateral acute interstitial lung disease superimposed on marked changes of COPD. (pulmonary edema vs viral pneumonitis). Small right pleural effusion. Mild mediastinal adenopathy. Dense coronary artery atheromatous calcifications. There is also diffuse distal esophageal wall thickening. This  could be due to incomplete distention, esophagitis or mass. These possibilities could be differentiated with direct endoscopic visualization 8/4 echo> LVEF 30-35%, akinesis of anterior wall, grade 2 diastolic dysfunction, PA pressure 42mmHg, RV mildly reduced function  SIGNIFICANT EVENTS: 7/30 admitted for clotted fem-pop bypass graft.  7/31: went to IR for angiogram and TNK.  8/1, 230 p: TNK stopped due to scant hemoptysis. Resumed at 5p. 1/2 dose  8/1 630: hemoptysis returned and was not resumed. Heparin continued.  8/2: increased shortness of breath.  8/2: placed on 100% NRB after returning from angiogram.  8/3 PCCM called. Still very hypoxic on 100% NRB. Had episode where sats dropped to 70s. Heparin stopped due to some episode of hemoptysis.  8/4 diuresis, cards consulted,  LINES/TUBES: PICC 8/3>>  INTERVAL HISTORY:  Feels much better today  VITAL SIGNS: Temp:  [97.2 F (36.2 C)-98.1 F (36.7 C)] 97.2 F (36.2 C) (08/05 1219) Pulse Rate:  [71-101] 89 (08/05 1000) Resp:  [17-34] 27 (08/05 1000) BP: (97-152)/(36-74) 103/39 mmHg (08/05 1000) SpO2:  [87 %-100 %] 87 % (08/05 1000) FiO2 (%):  [55 %] 55 % (08/05 0600) Weight:  [57.1 kg (125 lb 14.1 oz)] 57.1 kg (125 lb 14.1 oz) (08/05 0600) 100%   HEMODYNAMICS: CVP:  [0 mmHg-4 mmHg] 4 mmHg VENTILATOR SETTINGS: Vent Mode:  [-]  FiO2 (%):  [55 %] 55 % INTAKE / OUTPUT:  Intake/Output Summary (Last 24 hours) at 03/27/14 1232 Last data filed at 03/27/14 1000  Gross per 24 hour  Intake   2286 ml  Output   3725 ml  Net  -1439 ml    PHYSICAL EXAMINATION: General:  80 year old male, sitting up in chair, comfortable Neuro:  Awake, oriented, MAEW HEENT: NCAT, EOMs intact, no JVD Cardiovascular: Tachy, regular, no MGR Lungs:  Crackles in bases bilaterally Abdomen:  Soft, no tenderness to palpation Musculoskeletal:  Intact  Skin:  Intact   LABS:  CBC  Recent Labs Lab 03/26/14 0430 03/27/14 0500 03/27/14 1200  WBC 9.6  19.9* 20.7*  HGB 6.7* 9.4* 9.8*  HCT 19.9* 27.6* 28.7*  PLT 199 274 310   Coag's  Recent Labs Lab 03/21/14 1929  INR 1.17   BMET  Recent Labs Lab 03/26/14 0430 03/26/14 1315 03/27/14 0500  NA 138 136* 139  K 4.3 3.9 3.1*  CL 104 100 97  CO2 25 23 28   BUN 27* 32* 40*  CREATININE 1.02 1.03 1.40*  GLUCOSE 101* 122* 185*   Electrolytes  Recent Labs Lab 03/26/14 0430 03/26/14 1315 03/27/14 0500  CALCIUM 8.2* 8.4 8.2*   Sepsis Markers  Recent Labs Lab 03/25/14 1035 03/26/14 0430 03/27/14 0500  PROCALCITON 0.50 0.79 0.71    ABG  Recent Labs Lab 03/24/14 1306 03/25/14 0830  PHART 7.427 7.407  PCO2ART 32.8* 36.3  PO2ART 97.0 67.8*   Liver Enzymes  Recent Labs Lab 03/21/14 1929  AST 20  ALT 16  ALKPHOS 88  BILITOT 0.3  ALBUMIN 3.2*   Cardiac Enzymes  Recent Labs Lab 03/25/14 0249 03/25/14 1034  03/26/14 0430 03/26/14 1315 03/26/14 1613 03/26/14 1913  TROPONINI <0.30 <0.30  < >  --  0.49* 0.41* 0.42*  PROBNP  --  9493.0*  --  12292.0*  --   --   --   < > = values in this interval not displayed. Glucose  Recent Labs Lab 03/26/14 1323 03/26/14 1618 03/26/14 1919 03/26/14 2344 03/27/14 0349 03/27/14 0821  GLUCAP 124* 190* 202* 97 214* 154*   Imaging No results found. ASSESSMENT / PLAN: PULMONOLOGY: A: Acute hypoxemic respiratory failure  CT suggests baseline ILD and emphysema, he reports history of neither Acute process is either pulm edema, alveolar hemorrhage (received TNK), or pneumonia, less likely acute inflammatory (primary pulmonary) process or ARDS Serologic work up of ILD not helpful right now given steroids PCT trending up Hemoptysis > resolved P: Continue to treat as primarily CHF, but would expect he will need high O2 requirements given fibrosis and emphysema Continue lasix > intermittent Supplemental oxygen, Goal SpO2>92 ABX broad spectrum ( see infectious) Bronchodilators PRN Obtain baseline CXR  interpretation from PCP  CARDIOLOGY: A: NSTEMI, LBBB, tachycardia,and PAC's on EKG Acute CHF exac, LVEF 30-35%, anterior akinesis, BNP- 12292 Hx: HTN CVP unhelpful through PICC Hx: Hyperlipidemia P: Lasix today Heparin, cautious with no bolus given hemoptysis ASA Per cardiology  INFECTIOUS: A: HCAP? P: Levaquin 8/3>> 8/5 Zosyn 8/3 >> day 3/7 Vanco 8/3 >>day 3/x (consider stop 8/6)  HEMATOLOGY: A: Anemia, acute on chronic disease( baseline 10.3) > due to [facility] No clear gi loss, esophageal lesion on CT Hypercoagulability  Aspirin and Heparin   P: Follow CBC Hemoccult stool  RENAL: A: AKI > pre-renal? Contrast? Elevated BUN from steroids? P: BMET noon today with Ur Na, Cr, urea Stop lasix gtt, one dose lasix today Monitor UOP/BMET  GI: A: Distal esophageal wall thickening- ??incomplete distension, esophagitis, mass Heart healthy diet Constipation P: Consult GI for possible endoscopy after oxygenation improves Hemoccult blood Advance diet Stool softeners  ENDOCRINE: A: Hx DM P: SSI  GLOBAL: Discussed with son / patient >>> they are agreeable to short term ventilatory support if required. Would not be able to tolerate FOB unless intubated - would  defer for now  CC time 34 minutes  Heber Buffalo, MD [facility] Pager: [phone] Cell: [phone] If no response, call [phone]    03/27/2014, 12:32 PM

## 2014-03-28 ENCOUNTER — Other Ambulatory Visit: Payer: Self-pay | Admitting: *Deleted

## 2014-03-28 ENCOUNTER — Inpatient Hospital Stay (HOSPITAL_COMMUNITY): Payer: Medicare Other

## 2014-03-28 DIAGNOSIS — I739 Peripheral vascular disease, unspecified: Secondary | ICD-10-CM

## 2014-03-28 DIAGNOSIS — Z48812 Encounter for surgical aftercare following surgery on the circulatory system: Secondary | ICD-10-CM

## 2014-03-28 LAB — BASIC METABOLIC PANEL
Anion gap: 10 (ref 5–15)
BUN: 43 mg/dL — ABNORMAL HIGH (ref 6–23)
CHLORIDE: 97 meq/L (ref 96–112)
CO2: 32 meq/L (ref 19–32)
Calcium: 7.9 mg/dL — ABNORMAL LOW (ref 8.4–10.5)
Creatinine, Ser: 1.55 mg/dL — ABNORMAL HIGH (ref 0.50–1.35)
GFR calc Af Amer: 48 mL/min — ABNORMAL LOW (ref >90)
GFR calc non Af Amer: 41 mL/min — ABNORMAL LOW (ref >90)
GLUCOSE: 116 mg/dL — AB (ref 70–99)
Potassium: 3.1 mEq/L — ABNORMAL LOW (ref 3.7–5.3)
Sodium: 139 mEq/L (ref 137–147)

## 2014-03-28 LAB — UREA NITROGEN, URINE: UREA NITROGEN UR: 252 mg/dL

## 2014-03-28 LAB — GLUCOSE, CAPILLARY
GLUCOSE-CAPILLARY: 143 mg/dL — AB (ref 70–99)
GLUCOSE-CAPILLARY: 213 mg/dL — AB (ref 70–99)
Glucose-Capillary: 116 mg/dL — ABNORMAL HIGH (ref 70–99)
Glucose-Capillary: 108 mg/dL — ABNORMAL HIGH (ref 70–99)
Glucose-Capillary: 225 mg/dL — ABNORMAL HIGH (ref 70–99)
Glucose-Capillary: 396 mg/dL — ABNORMAL HIGH (ref 70–99)

## 2014-03-28 LAB — HEPARIN LEVEL (UNFRACTIONATED)
HEPARIN UNFRACTIONATED: 0.91 [IU]/mL — AB (ref 0.30–0.70)
HEPARIN UNFRACTIONATED: 0.56 [IU]/mL (ref 0.30–0.70)
Heparin Unfractionated: 0.43 IU/mL (ref 0.30–0.70)

## 2014-03-28 LAB — RHEUMATOID FACTOR: Rhuematoid fact SerPl-aCnc: <10 IU/mL (ref <=14)

## 2014-03-28 LAB — CBC
HCT: 26.7 % — ABNORMAL LOW (ref 39.0–52.0)
HEMOGLOBIN: 9 g/dL — AB (ref 13.0–17.0)
MCH: 30 pg (ref 26.0–34.0)
MCHC: 33.7 g/dL (ref 30.0–36.0)
MCV: 89 fL (ref 78.0–100.0)
Platelets: 295 10*3/uL (ref 150–400)
RBC: 3 MIL/uL — AB (ref 4.22–5.81)
RDW: 15.1 % (ref 11.5–15.5)
WBC: 12.7 10*3/uL — AB (ref 4.0–10.5)

## 2014-03-28 LAB — SEDIMENTATION RATE: SED RATE: 45 mm/h — AB (ref 0–16)

## 2014-03-28 MED ORDER — POTASSIUM CHLORIDE CRYS ER 20 MEQ PO TBCR
40.0000 meq | EXTENDED_RELEASE_TABLET | Freq: Once | ORAL | Status: AC
  Administered 2014-03-28: 40 meq via ORAL
  Filled 2014-03-28: qty 2

## 2014-03-28 MED ORDER — HEPARIN (PORCINE) IN NACL 100-0.45 UNIT/ML-% IJ SOLN
950.0000 [IU]/h | INTRAMUSCULAR | Status: DC
  Filled 2014-03-28 (×3): qty 250

## 2014-03-28 MED ORDER — DIPHENHYDRAMINE HCL 25 MG PO CAPS
25.0000 mg | ORAL_CAPSULE | Freq: Three times a day (TID) | ORAL | Status: DC
  Administered 2014-03-28 – 2014-03-29 (×3): 25 mg via ORAL
  Filled 2014-03-28 (×6): qty 1

## 2014-03-28 MED ORDER — FUROSEMIDE 10 MG/ML IJ SOLN
20.0000 mg | Freq: Once | INTRAMUSCULAR | Status: AC
  Administered 2014-03-28: 20 mg via INTRAVENOUS
  Filled 2014-03-28: qty 2

## 2014-03-28 MED ORDER — METHYLPREDNISOLONE SODIUM SUCC 125 MG IJ SOLR
60.0000 mg | Freq: Three times a day (TID) | INTRAMUSCULAR | Status: DC
  Administered 2014-03-28 – 2014-03-30 (×6): 60 mg via INTRAVENOUS
  Filled 2014-03-28 (×2): qty 0.96
  Filled 2014-03-28: qty 2
  Filled 2014-03-28 (×7): qty 0.96

## 2014-03-28 NOTE — Progress Notes (Signed)
ANTICOAGULATION CONSULT NOTE - Follow Up Consult  Pharmacy Consult for heparin Indication: NSTEMI and s/p thrombolysis  Labs:  Recent Labs  03/26/14 1315 03/26/14 1613 03/26/14 1913 03/27/14 0500 03/27/14 1200 03/27/14 2100 03/28/14 0500  HGB  --   --   --  9.4* 9.8*  --  9.0*  HCT  --   --   --  27.6* 28.7*  --  26.7*  PLT  --   --   --  274 310  --  295  HEPARINUNFRC  --   --   --   --   --  0.84* 0.91*  CREATININE 1.03  --   --  1.40* 1.50*  --   --   TROPONINI 0.49* 0.41* 0.42*  --   --   --   --     Assessment: 80yo male now with higher heparin level despite decrease rate last pm; pt did have a heparin bag exchanged yesterday which could explain some of the variability in levels.  Goal of Therapy:  Heparin level 0.3-0.5 units/ml   Plan:  Will hold heparin gtt x21min then resume at lower rate of 1000 units/hr and check level in 8hr.  Vernard Gambles, PharmD, BCPS  03/28/2014,5:37 AM

## 2014-03-28 NOTE — Progress Notes (Addendum)
     Subjective  - Doing well over all.  Alert and oriented.   Objective 137/18 76 97.8 F (36.6 C) (Oral) 21 99%  Intake/Output Summary (Last 24 hours) at 03/28/14 0803 Last data filed at 03/28/14 0700  Gross per 24 hour  Intake 1767.58 ml  Output   2401 ml  Net -633.42 ml    Doppler PT bilateral signals Skin warm to touch with normal great toe color normal on left.  Assessment/Planning: S/p IR thrombolysis LLE By-pass open with doppler signals  Cardiology NSTEMI CHF work up in progress  Respiratory failure on 55% ventilator mask  4 weeks following discharge with duplex scan of his bypass in the office       Cl[facility] 03/28/2014 8:03 AM --  Laboratory Lab Results:  Recent Labs  03/27/14 1200 03/28/14 0500  WBC 20.7* 12.7*  HGB 9.8* 9.0*  HCT 28.7* 26.7*  PLT 310 295   BMET  Recent Labs  03/27/14 1200 03/28/14 0500  NA 134* 139  K 3.5* 3.1*  CL 92* 97  CO2 28 32  GLUCOSE 307* 116*  BUN 43* 43*  CREATININE 1.50* 1.55*  CALCIUM 8.0* 7.9*    COAG Lab Results  Component Value Date   INR 1.17 03/21/2014   No results found for this basename: PTT      Left femoropopliteal bypass functioning nicely We'll continue to follow with you

## 2014-03-28 NOTE — Progress Notes (Signed)
Inpatient Diabetes Program Recommendations  AACE/ADA: New Consensus Statement on Inpatient Glycemic Control (2013)  Target Ranges:  Prepandial:   less than 140 mg/dL      Peak postprandial:   less than 180 mg/dL (1-2 hours)      Critically ill patients:  140 - 180 mg/dL   Reason for Assessment:  Results for CLEMONS, STEPLER (MRN [id number]) as of 03/28/2014 10:07  Ref. Range 03/27/2014 16:24 03/27/2014 19:38 03/27/2014 23:50 03/28/2014 03:34 03/28/2014 08:18  Glucose-Capillary Latest Range: 70-99 mg/dL 734 (H) 287 (H) 76 681 (H) 108 (H)   Diabetes history: Type 2 Diabetes Outpatient Diabetes medications:  Metformin 1000 mg bid Current orders for Inpatient glycemic control:  Novolog moderate q 4 hours, Lantus 10 units daily  Note that patient is starting IV steroids today.  Consider adding Novolog 4 units tid with meals to cover CHO intake/meals-To be held if patient eats less than 50%.  Thanks, Beryl Meager, RN, BC-ADM Inpatient Diabetes Coordinator Pager [phone]

## 2014-03-28 NOTE — Progress Notes (Signed)
Patient Name: Trevor Morris Date of Encounter: 03/28/2014     Active Problems:   Occlusion of left femoropopliteal bypass graft   Acute respiratory failure with hypoxia   NSTEMI (non-ST elevated myocardial infarction)   COPD (chronic obstructive pulmonary disease)   Acute combined systolic and diastolic heart failure   Hypokalemia    SUBJECTIVE  Feels better. No chest pain.  CURRENT MEDS . aspirin EC  325 mg Oral Daily  . atorvastatin  80 mg Oral q1800  . carvedilol  3.125 mg Oral BID WC  . diphenhydrAMINE  25 mg Oral 3 times per day  . docusate sodium  100 mg Oral BID  . feeding supplement (GLUCERNA SHAKE)  237 mL Oral TID BM  . insulin aspart  0-15 Units Subcutaneous 6 times per day  . insulin glargine  10 Units Subcutaneous Daily  . lisinopril  20 mg Oral Daily  . methylPREDNISolone (SOLU-MEDROL) injection  60 mg Intravenous 3 times per day  . multivitamin with minerals  1 tablet Oral Daily  . pantoprazole  40 mg Oral Daily  . piperacillin-tazobactam (ZOSYN)  IV  3.375 g Intravenous Q8H  . potassium chloride  20-40 mEq Oral Once  . potassium chloride  40 mEq Oral Once  . potassium chloride  40 mEq Oral Once  . sodium chloride  10-40 mL Intracatheter Q12H  . sodium chloride  3 mL Intravenous Q12H    OBJECTIVE  Filed Vitals:   03/28/14 0600 03/28/14 0700 03/28/14 0823 03/28/14 1135  BP: 137/18     Pulse: 92 76    Temp:   97.3 F (36.3 C) 98.7 F (37.1 C)  TempSrc:   Oral Axillary  Resp: 31 21    Height:      Weight:      SpO2: 94% 99%      Intake/Output Summary (Last 24 hours) at 03/28/14 1155 Last data filed at 03/28/14 0700  Gross per 24 hour  Intake 1375.08 ml  Output   2201 ml  Net -825.92 ml   Filed Weights   03/26/14 0600 03/27/14 0600 03/28/14 0500  Weight: 133 lb 13.1 oz (60.7 kg) 125 lb 14.1 oz (57.1 kg) 126 lb 5.2 oz (57.3 kg)    PHYSICAL EXAM  General: Pleasant, NAD. Neuro: Alert and oriented X 3. Moves all extremities  spontaneously. Psych: Normal affect. HEENT:  Normal  Neck: Supple without bruits or JVD. Lungs:  Resp regular and unlabored, Rales at both bases Heart: RRR no s3, s4, or murmurs. Abdomen: Soft, non-tender, non-distended, BS + x 4.  Extremities: No clubbing, cyanosis or edema. DP/PT/Radials 2+ and equal bilaterally.  Accessory Clinical Findings  CBC  Recent Labs  03/26/14 0430  03/27/14 1200 03/28/14 0500  WBC 9.6  < > 20.7* 12.7*  NEUTROABS 9.0*  --  18.9*  --   HGB 6.7*  < > 9.8* 9.0*  HCT 19.9*  < > 28.7* 26.7*  MCV 91.7  < > 90.3 89.0  PLT 199  < > 310 295  < > = values in this interval not displayed. Basic Metabolic Panel  Recent Labs  03/27/14 1200 03/28/14 0500  NA 134* 139  K 3.5* 3.1*  CL 92* 97  CO2 28 32  GLUCOSE 307* 116*  BUN 43* 43*  CREATININE 1.50* 1.55*  CALCIUM 8.0* 7.9*   Liver Function Tests No results found for this basename: AST, ALT, ALKPHOS, BILITOT, PROT, ALBUMIN,  in the last 72 hours No results found for this  basename: LIPASE, AMYLASE,  in the last 72 hours Cardiac Enzymes  Recent Labs  03/26/14 1315 03/26/14 1613 03/26/14 1913  TROPONINI 0.49* 0.41* 0.42*   BNP No components found with this basename: POCBNP,  D-Dimer No results found for this basename: DDIMER,  in the last 72 hours Hemoglobin A1C No results found for this basename: HGBA1C,  in the last 72 hours Fasting Lipid Panel No results found for this basename: CHOL, HDL, LDLCALC, TRIG, CHOLHDL, LDLDIRECT,  in the last 72 hours Thyroid Function Tests No results found for this basename: TSH, T4TOTAL, FREET3, T3FREE, THYROIDAB,  in the last 72 hours  TELE  NSR with PACs  ECG    Radiology/Studies  Dg Chest 2 View  03/21/2014   CLINICAL DATA:  Preoperative evaluation; hypertension ; emphysema  EXAM: CHEST  2 VIEW  COMPARISON:  None.  FINDINGS: There is underlying emphysema. There is extensive interstitial fibrosis bilaterally. There may be a degree of interstitial  edema superimposed. There is no airspace consolidation. The the heart size is normal. The pulmonary vascularity reflects underlying emphysema. No adenopathy. No bone lesions.  IMPRESSION: Emphysema with widespread interstitial fibrosis. There may be some superimposed interstitial edema ; a degree of superimposed congestive heart failure cannot be excluded radiographically. There is no airspace consolidation or adenopathy apparent.   Electronically Signed   By: Bretta Bang M.D.   On: 03/21/2014 21:35   Ct Chest Wo Contrast  03/25/2014   ADDENDUM REPORT: 03/25/2014 16:49  ADDENDUM: There is also diffuse distal esophageal wall thickening. This could be due to incomplete distention, esophagitis or mass. These possibilities could be differentiated with direct endoscopic visualization.   Electronically Signed   By: Gordan Payment M.D.   On: 03/25/2014 16:49   03/25/2014   CLINICAL DATA:  Evaluate lung infiltrates seen on the portable chest earlier today.  EXAM: CT CHEST WITHOUT CONTRAST  TECHNIQUE: Multidetector CT imaging of the chest was performed following the standard protocol without IV contrast.  COMPARISON:  Portable chest obtained earlier today. Abdomen CT dated 02/19/2014.  FINDINGS: Interval diffuse increase in prominence of the interstitial markings throughout the majority of both lungs, including ground-glass opacities. Extensive bullous changes bilaterally. No masses or focal consolidation. Enlarged precarinal node with a short axis diameter of 19.7 mm on image number 23. Mildly enlarged AP window nodes, the largest with a short axis diameter of 9.3 mm on image number 22.  Small right pleural effusion. Dense coronary artery calcifications. Diffuse low density of the blood. Diffuse distal esophageal wall thickening with a maximum thickness of 10.3 mm on image number 49. Unremarkable upper abdomen. Mild thoracic spine degenerative changes.  IMPRESSION: 1. Extensive bilateral acute interstitial lung disease  superimposed on marked changes of COPD. Differential considerations include pulmonary edema and viral pneumonitis. 2. Small right pleural effusion. 3. Mild mediastinal adenopathy, possibly reactive. 4. Dense coronary artery atheromatous calcifications. 5. Anemia.  Electronically Signed: By: Gordan Payment M.D. On: 03/25/2014 16:46   Ir Angiogram Extremity Right  03/22/2014   CLINICAL DATA:  Thrombosed left femoral popliteal arterial saphenous vein bypass graft.  EXAM: IR INFUSION THROMBOL ARTERIAL INITIAL (MS); RIGHT EXTREMITY ARTERIOGRAPHY; IR ULTRASOUND GUIDANCE VASC ACCESS RIGHT  FLUOROSCOPY TIME:  20 min and 12 seconds.  MEDICATIONS AND MEDICAL HISTORY: Versed two mg, Fentanyl 100 mcg.  Additional Medications: Heparin 5000 units.  ANESTHESIA/SEDATION: Moderate sedation time: 60 minutes  CONTRAST:  150 cc Omnipaque 300  PROCEDURE: The procedure, risks, benefits, and alternatives were explained to the patient.  Questions regarding the procedure were encouraged and answered. The patient understands and consents to the procedure.  The right groin was prepped with Betadine in a sterile fashion, and a sterile drape was applied covering the operative field. A sterile gown and sterile gloves were used for the procedure.  Under sonographic guidance, a micropuncture needle was inserted into the right common femoral artery and removed over a 018 wire, which was up sized to a Bentson. A 5 French sheath was inserted. A 4 French omni Flush catheter was advanced over the wire to the aorta. Pelvic angiography was performed.  The Omni Flush catheter was carefully advanced over a glidewire into the left common iliac artery and finally into the left external iliac artery. The catheter was removed over a Rosen wire. The 5 French sheath was then exchanged for a 35 cm 6 French sheath. This could be advanced to the upper right common iliac artery. It could not be advanced across the bifurcation secondary to significant plaque.  A total  of 5000 units of heparin was injected peripherally.  A vertebral 5 French catheter was advanced over the [place] wire to the left external iliac artery and angiography of the left groin and proximal thigh were obtained.  The vertebral catheter was then advanced over a stiff glidewire into the thrombosed venous bypass graft, to the level of the knee. The catheter was removed over a Rosen wire. A 135 cm 50 cm infusion length 5 French multi side hole catheter was then advanced over the [place] wire to the level of the knee. The catheter and sheath were secured in place. TNK at 0.5 milligrams/hour was instituted into the infusion catheter.  FINDINGS: Pelvic angiography demonstrates atherosclerotic plaque at the origin of the right and left common iliac arteries without narrowing greater than 50%. The left external iliac artery is patent.  The left common femoral artery is patent. The left superficial femoral artery is severely diseased. The left femoral popliteal artery is occluded.  The next series of images demonstrate placement of an infusion catheter into the femoral popliteal artery bypass graft.  COMPLICATIONS: None  IMPRESSION: Successful placement of an infusion catheter into a thrombosed left femoral popliteal artery bypass graft. TNK was instituted at 0.5 milligrams/hour.   Electronically Signed   By: Maryclare Bean M.D.   On: 03/22/2014 14:49   Ir Fem Pop Art Pta Mod Sed  03/24/2014   CLINICAL DATA:  48 hr post left lower extremity fem-pop graft lysis.  EXAM: IR THROMB F/U EVAL ART/VEN FINAL DAY; IR FEM POP ART PTA  FLUOROSCOPY TIME:  17 min and 21 seconds.  MEDICATIONS AND MEDICAL HISTORY: Versed 0.5 mg, Fentanyl 25 mcg.  Additional Medications: Heparin 500 units.  ANESTHESIA/SEDATION: Moderate sedation time: 85 minutes  CONTRAST:  50 cc Visipaque 320  PROCEDURE: The procedure, risks, benefits, and alternatives were explained to the patient. Questions regarding the procedure were encouraged and answered. The  patient understands and consents to the procedure.  The right groin was prepped with Betadine in a sterile fashion, and a sterile drape was applied covering the operative field. A sterile gown and sterile gloves were used for the procedure.  Contrast was injected into the multi side-hole catheter and a right lower extremity runoff was performed. This demonstrated resolution of the thrombus with residual narrowing at the distal graft to tibioperoneal trunk anastomosis as well as a distal tibioperoneal trunk stenosis. The heparin was stopped during the procedure. After approximately 20 min, repeat angiogram demonstrates recurrent  early occlusion at these areas of narrowing. 500 units of additional heparin was administered.  The infusion catheter was exchanged over a 300 cm stiff a 018 wire for a 4 mm x 2 cm balloon.  A glidewire was advanced through the balloon and across the 2 lesions into the peroneal artery. The glidewire was exchanged for the stiff 018 wire. The 2 lesions were dilated.  Post angioplasty imaging with angiography demonstrates a dissection in the tibioperoneal trunk narrowing location. This area was read dilated and held in place for 60 seconds. Repeat angiogram demonstrates wide patency at the 2 areas of stenosis.  A full left lower extremity runoff demonstrates patency of the left common femoral artery, bypass graft, tibioperoneal trunk, and with 2 vessel runoff via the posterior tibial and peroneal arteries.  The sheath was removed and hemostasis was achieved with an exo seal device.  FINDINGS: Initial angiogram demonstrates resolution of the thrombus throughout the fem-pop venous bypass graft but 2 areas of narrowing distally, at the distal anastomosis, and within the tibioperoneal trunk.  After angioplasty, initial repeat imaging demonstrates a dissection at the tibioperoneal trunk stenosis.  After repeat angioplasty, the vessel was noted to be widely patent with some irregularity.  Left lower  extremity runoff was performed demonstrating patency of the graft, distal anastomosis to the tibioperoneal trunk, and 2 vessel runoff via the posterior tibial artery and peroneal artery.  COMPLICATIONS: None  IMPRESSION: Successful lysis of a femoral popliteal bypass graft and successful angioplasty of 2 areas of infrapopliteal narrowing as described.   Electronically Signed   By: Maryclare Bean M.D.   On: 03/24/2014 12:07   Ir US Guide Vasc Access Right  03/22/2014   CLINICAL DATA:  Thrombosed left femoral popliteal arterial saphenous vein bypass graft.  EXAM: IR INFUSION THROMBOL ARTERIAL INITIAL (MS); RIGHT EXTREMITY ARTERIOGRAPHY; IR ULTRASOUND GUIDANCE VASC ACCESS RIGHT  FLUOROSCOPY TIME:  20 min and 12 seconds.  MEDICATIONS AND MEDICAL HISTORY: Versed two mg, Fentanyl 100 mcg.  Additional Medications: Heparin 5000 units.  ANESTHESIA/SEDATION: Moderate sedation time: 60 minutes  CONTRAST:  150 cc Omnipaque 300  PROCEDURE: The procedure, risks, benefits, and alternatives were explained to the patient. Questions regarding the procedure were encouraged and answered. The patient understands and consents to the procedure.  The right groin was prepped with Betadine in a sterile fashion, and a sterile drape was applied covering the operative field. A sterile gown and sterile gloves were used for the procedure.  Under sonographic guidance, a micropuncture needle was inserted into the right common femoral artery and removed over a 018 wire, which was up sized to a Bentson. A 5 French sheath was inserted. A 4 French omni Flush catheter was advanced over the wire to the aorta. Pelvic angiography was performed.  The Omni Flush catheter was carefully advanced over a glidewire into the left common iliac artery and finally into the left external iliac artery. The catheter was removed over a Rosen wire. The 5 French sheath was then exchanged for a 35 cm 6 French sheath. This could be advanced to the upper right common iliac  artery. It could not be advanced across the bifurcation secondary to significant plaque.  A total of 5000 units of heparin was injected peripherally.  A vertebral 5 French catheter was advanced over the [place] wire to the left external iliac artery and angiography of the left groin and proximal thigh were obtained.  The vertebral catheter was then advanced over a stiff glidewire into the thrombosed  venous bypass graft, to the level of the knee. The catheter was removed over a Rosen wire. A 135 cm 50 cm infusion length 5 French multi side hole catheter was then advanced over the [place] wire to the level of the knee. The catheter and sheath were secured in place. TNK at 0.5 milligrams/hour was instituted into the infusion catheter.  FINDINGS: Pelvic angiography demonstrates atherosclerotic plaque at the origin of the right and left common iliac arteries without narrowing greater than 50%. The left external iliac artery is patent.  The left common femoral artery is patent. The left superficial femoral artery is severely diseased. The left femoral popliteal artery is occluded.  The next series of images demonstrate placement of an infusion catheter into the femoral popliteal artery bypass graft.  COMPLICATIONS: None  IMPRESSION: Successful placement of an infusion catheter into a thrombosed left femoral popliteal artery bypass graft. TNK was instituted at 0.5 milligrams/hour.   Electronically Signed   By: Maryclare Bean M.D.   On: 03/22/2014 14:49   Dg Chest Port 1 View  03/28/2014   CLINICAL DATA:  Check endotracheal tube placement  EXAM: PORTABLE CHEST - 1 VIEW  COMPARISON:  03/27/2014  FINDINGS: A right-sided PICC line is again noted and stable. No endotracheal tube is identified at this time. Diffuse increased density is again noted in the right mid and upper lung as well as the left lower lung. The overall appearance is stable from the prior exam.  IMPRESSION: No significant change from the prior day. No endotracheal  tube is noted.   Electronically Signed   By: Alcide Clever M.D.   On: 03/28/2014 07:49   Dg Chest Port 1 View  03/27/2014   CLINICAL DATA:  Lung infiltrates  EXAM: PORTABLE CHEST - 1 VIEW  COMPARISON:  Chest radiograph and chest CT March 25, 2014  FINDINGS: There is underlying emphysematous change. There is a degree of underlying interstitial fibrosis. Widespread interstitial and alveolar edema remain without appreciable change. Heart is mildly enlarged. The pulmonary vascularity is within normal limits. Central catheter tip is in the right atrium slightly distal to the cavoatrial junction, stable. No pneumothorax.  IMPRESSION: Suspect congestive heart failure superimposed on chronic interstitial fibrosis and emphysema. There may well be underlying infectious pneumonia as well. The appearance is stable compared to 2 days prior.   Electronically Signed   By: Bretta Bang M.D.   On: 03/27/2014 07:19   Dg Chest Port 1 View  03/25/2014   CLINICAL DATA:  Shortness of breath  EXAM: PORTABLE CHEST - 1 VIEW  COMPARISON:  03/24/2014  FINDINGS: Cardiac shadow is within normal limits. Diffuse bilateral infiltrates are again identified. A PICC line is now been placed on the right with the catheter tip at the cavoatrial junction. No pneumothorax is seen. No other focal abnormality is noted.  IMPRESSION: New PICC line in satisfactory position.  Bilateral parenchymal infiltrates stable from the previous day.   Electronically Signed   By: Alcide Clever M.D.   On: 03/25/2014 09:12   Dg Chest Port 1 View  03/24/2014   CLINICAL DATA:  Short of breath, coughing congestion  EXAM: PORTABLE CHEST - 1 VIEW  COMPARISON:  Prior chest x-ray 03/24/2014 at 3:28 a.m.  FINDINGS: Persistent diffuse bilateral predominantly interstitial opacities. There has been no significant interval change compared to the radiograph from earlier this morning. Cardiac and mediastinal contours remain unchanged. Atherosclerotic calcifications are present  within the transverse aorta. Upper abdominal gas pattern is unremarkable. No  acute osseous abnormality.  IMPRESSION: No significant interval change in the appearance of the lungs. Persistent diffuse bilateral interstitial opacities which may reflect pulmonary fibrosis, interstitial edema or atypical infection.   Electronically Signed   By: Malachy MoanHeath  McCullough M.D.   On: 03/24/2014 14:40   Dg Chest Port 1 View  03/24/2014   CLINICAL DATA:  Hemoptysis and wheezing.  EXAM: PORTABLE CHEST - 1 VIEW  COMPARISON:  03/21/2014  FINDINGS: Borderline heart size. Diffuse coarse interstitial infiltrates throughout both lungs likely representing diffuse interstitial fibrosis. No significant change since previous study, allowing for technical differences. No focal consolidation. No blunting of costophrenic angles. No pneumothorax.  IMPRESSION: Diffuse interstitial infiltrates in the lungs likely fibrosis. No change since prior study.   Electronically Signed   By: Burman NievesWilliam  Stevens M.D.   On: 03/24/2014 04:05   Ir Infusion Thrombol Arterial Initial (ms)  03/22/2014   CLINICAL DATA:  Thrombosed left femoral popliteal arterial saphenous vein bypass graft.  EXAM: IR INFUSION THROMBOL ARTERIAL INITIAL (MS); RIGHT EXTREMITY ARTERIOGRAPHY; IR ULTRASOUND GUIDANCE VASC ACCESS RIGHT  FLUOROSCOPY TIME:  20 min and 12 seconds.  MEDICATIONS AND MEDICAL HISTORY: Versed two mg, Fentanyl 100 mcg.  Additional Medications: Heparin 5000 units.  ANESTHESIA/SEDATION: Moderate sedation time: 60 minutes  CONTRAST:  150 cc Omnipaque 300  PROCEDURE: The procedure, risks, benefits, and alternatives were explained to the patient. Questions regarding the procedure were encouraged and answered. The patient understands and consents to the procedure.  The right groin was prepped with Betadine in a sterile fashion, and a sterile drape was applied covering the operative field. A sterile gown and sterile gloves were used for the procedure.  Under sonographic  guidance, a micropuncture needle was inserted into the right common femoral artery and removed over a 018 wire, which was up sized to a Bentson. A 5 French sheath was inserted. A 4 French omni Flush catheter was advanced over the wire to the aorta. Pelvic angiography was performed.  The Omni Flush catheter was carefully advanced over a glidewire into the left common iliac artery and finally into the left external iliac artery. The catheter was removed over a Rosen wire. The 5 French sheath was then exchanged for a 35 cm 6 French sheath. This could be advanced to the upper right common iliac artery. It could not be advanced across the bifurcation secondary to significant plaque.  A total of 5000 units of heparin was injected peripherally.  A vertebral 5 French catheter was advanced over the [place] wire to the left external iliac artery and angiography of the left groin and proximal thigh were obtained.  The vertebral catheter was then advanced over a stiff glidewire into the thrombosed venous bypass graft, to the level of the knee. The catheter was removed over a Rosen wire. A 135 cm 50 cm infusion length 5 French multi side hole catheter was then advanced over the [place] wire to the level of the knee. The catheter and sheath were secured in place. TNK at 0.5 milligrams/hour was instituted into the infusion catheter.  FINDINGS: Pelvic angiography demonstrates atherosclerotic plaque at the origin of the right and left common iliac arteries without narrowing greater than 50%. The left external iliac artery is patent.  The left common femoral artery is patent. The left superficial femoral artery is severely diseased. The left femoral popliteal artery is occluded.  The next series of images demonstrate placement of an infusion catheter into the femoral popliteal artery bypass graft.  COMPLICATIONS: None  IMPRESSION:  Successful placement of an infusion catheter into a thrombosed left femoral popliteal artery bypass graft.  TNK was instituted at 0.5 milligrams/hour.   Electronically Signed   By: Maryclare Bean M.D.   On: 03/22/2014 14:49   Ir Rande Lawman F/u Eval Art/ven Basil Dess Day (ms)  03/23/2014   CLINICAL DATA:  24 hr post left lower extremity arterial lysis check  EXAM: IR THROMB F/U EVAL ART/VEN SUBSEQ DAY (MS)  FLUOROSCOPY TIME:  30 seconds.  MEDICATIONS AND MEDICAL HISTORY: None  ANESTHESIA/SEDATION: None  CONTRAST:  20 cc Omnipaque 350  PROCEDURE: The procedure, risks, benefits, and alternatives were explained to the patient. Questions regarding the procedure were encouraged and answered. The patient understands and consents to the procedure.  Contrast was injected into the infusion catheter and imaging was obtained.  FINDINGS: The left common femoral artery remains patent.  The saphenous vein graft is patent to the level of the knee. It is occluded at the knee. The infusion catheter remains in place. Runoff tibial vessels were not seen to opacified.  COMPLICATIONS: None  IMPRESSION: There is improvement with near complete lysed cysts of the saphenous vein graft. There continues to be occlusive thrombus distally. The plan is for an additional 24 hr of lysis with a follow-up check tomorrow morning.   Electronically Signed   By: Maryclare Bean M.D.   On: 03/23/2014 09:12   Ir Rande Lawman F/u Eval Art/ven Final Day (ms)  03/24/2014   CLINICAL DATA:  48 hr post left lower extremity fem-pop graft lysis.  EXAM: IR THROMB F/U EVAL ART/VEN FINAL DAY; IR FEM POP ART PTA  FLUOROSCOPY TIME:  17 min and 21 seconds.  MEDICATIONS AND MEDICAL HISTORY: Versed 0.5 mg, Fentanyl 25 mcg.  Additional Medications: Heparin 500 units.  ANESTHESIA/SEDATION: Moderate sedation time: 85 minutes  CONTRAST:  50 cc Visipaque 320  PROCEDURE: The procedure, risks, benefits, and alternatives were explained to the patient. Questions regarding the procedure were encouraged and answered. The patient understands and consents to the procedure.  The right groin was prepped with  Betadine in a sterile fashion, and a sterile drape was applied covering the operative field. A sterile gown and sterile gloves were used for the procedure.  Contrast was injected into the multi side-hole catheter and a right lower extremity runoff was performed. This demonstrated resolution of the thrombus with residual narrowing at the distal graft to tibioperoneal trunk anastomosis as well as a distal tibioperoneal trunk stenosis. The heparin was stopped during the procedure. After approximately 20 min, repeat angiogram demonstrates recurrent early occlusion at these areas of narrowing. 500 units of additional heparin was administered.  The infusion catheter was exchanged over a 300 cm stiff a 018 wire for a 4 mm x 2 cm balloon.  A glidewire was advanced through the balloon and across the 2 lesions into the peroneal artery. The glidewire was exchanged for the stiff 018 wire. The 2 lesions were dilated.  Post angioplasty imaging with angiography demonstrates a dissection in the tibioperoneal trunk narrowing location. This area was read dilated and held in place for 60 seconds. Repeat angiogram demonstrates wide patency at the 2 areas of stenosis.  A full left lower extremity runoff demonstrates patency of the left common femoral artery, bypass graft, tibioperoneal trunk, and with 2 vessel runoff via the posterior tibial and peroneal arteries.  The sheath was removed and hemostasis was achieved with an exo seal device.  FINDINGS: Initial angiogram demonstrates resolution of the thrombus throughout the fem-pop venous bypass  graft but 2 areas of narrowing distally, at the distal anastomosis, and within the tibioperoneal trunk.  After angioplasty, initial repeat imaging demonstrates a dissection at the tibioperoneal trunk stenosis.  After repeat angioplasty, the vessel was noted to be widely patent with some irregularity.  Left lower extremity runoff was performed demonstrating patency of the graft, distal anastomosis  to the tibioperoneal trunk, and 2 vessel runoff via the posterior tibial artery and peroneal artery.  COMPLICATIONS: None  IMPRESSION: Successful lysis of a femoral popliteal bypass graft and successful angioplasty of 2 areas of infrapopliteal narrowing as described.   Electronically Signed   By: Maryclare Bean M.D.   On: 03/24/2014 12:07    ASSESSMENT AND PLAN 1. Acute hypoxic respiratory failure 2. NSTEMI in setting of severe anemia 3. Acute systolic heart failure with EF 30-35 %.  Anterior akinesis by echo.  Plan: Continue carvedilol, ASA, atorvastatin, ACEi Intermittent Lasix per CCM. Anticipate possible left heart cath early next week after respiratory status improves further. Signed, Cassell Clement MD

## 2014-03-28 NOTE — Progress Notes (Signed)
ANTICOAGULATION CONSULT NOTE - Follow Up Consult  Pharmacy Consult for Heparin Indication: NSTEMI; s/p aortogram and thrombolysis of LLE bypass   Allergies  Allergen Reactions  . Prednisone Hives  . Omnipaque [Iohexol] Itching and Rash    Patient Measurements: Height: 5\' 4"  (162.6 cm) Weight: 126 lb 5.2 oz (57.3 kg) IBW/kg (Calculated) : 59.2 Heparin Dosing Weight: 59.6 kg  Vital Signs: Temp: 98.7 F (37.1 C) (08/06 1135) Temp src: Axillary (08/06 1135) BP: 127/51 mmHg (08/06 1200) Pulse Rate: 85 (08/06 1300)  Labs:  Recent Labs  03/26/14 1315 03/26/14 1613 03/26/14 1913 03/27/14 0500 03/27/14 1200 03/27/14 2100 03/28/14 0500 03/28/14 1350  HGB  --   --   --  9.4* 9.8*  --  9.0*  --   HCT  --   --   --  27.6* 28.7*  --  26.7*  --   PLT  --   --   --  274 310  --  295  --   HEPARINUNFRC  --   --   --   --   --  0.84* 0.91* 0.56  CREATININE 1.03  --   --  1.40* 1.50*  --  1.55*  --   TROPONINI 0.49* 0.41* 0.42*  --   --   --   --   --     Estimated Creatinine Clearance: 31.8 ml/min (by C-G formula based on Cr of 1.55).   Assessment: 80 y/o male with acute occlusion of left femoral-below-knee popliteal bypass s/p lysis with TNK. He has had episodes of intermittent hemoptysis and his heparin drip was stopped 8/3. Pharmacy consulted to resume for NSTEMI with no bolus. Will target low end of goal to minimize bleeding.  Heparin level is just above lower goal on 1000 units/hr. H/H are low stable, platelets are normal.  Goal of Therapy:  Heparin level 0.3-0.5 units/ml with previous hemoptysis Monitor platelets by anticoagulation protocol: Yes   Plan:  - Decrease heparin drip to 950 units/hr - 8 hr heparin level - Daily heparin level and CBC - Monitor closely for s/sx of bleeding   [facility], [place].D., BCPS Clinical Pharmacist Pager: [phone] 03/28/2014 2:22 PM

## 2014-03-28 NOTE — Progress Notes (Signed)
PULMONARY / CRITICAL CARE MEDICINE  Name: Trevor Morris MRN: [id number] DOB: March 09, 1934    ADMISSION DATE:  03/21/2014 CONSULTATION DATE: Lillia Mountain  REFERRING MD :  Darrick Penna   CHIEF COMPLAINT:  Acute hypoxic respiratory failure   INITIAL PRESENTATION:  80 year old male w/ known h/o CM allergy. Underwent angiography and TNK administration for lysis of distal clot at his by-pass graft site on 7/31. His post-op course was complicated by episodes of scant hemoptysis. First noted 8/1 and continued so TNK was eventually stopped. He went to arteriogram 3 times total between 7/31 and 8/2. On 8/2 he began to have significant increase in dyspnea and by the time he returned to the SICU after angiogram required 100% NRB. He received lasix and supportive care. PCCM asked to see 8/3 as symptoms had continued to worsen.    reports that he quit smoking about 26 years ago.   has a past medical history of Diabetes mellitus; Hyperlipidemia; Hypertension; Anemia; COPD (chronic obstructive pulmonary disease); and Peripheral vascular disease.   has past surgical history that includes vein bypass graft,aorto-fem-pop.   STUDIES:  7/31: arteriogram: Pelvic angiography demonstrates atherosclerotic plaque at the origin of the right and left common iliac arteries without narrowing greater than 50%. The left external iliac artery is patent. Successful placement of an infusion catheter into a thrombosed left femoral popliteal artery bypass graft. TNK was instituted at 0.5 milligrams/hour 8/1: arteriogram: improvement with near complete lysed cysts of the saphenous vein graft. There continues to be occlusive thrombus distally. The plan is for an additional 24 hr of lysis with a follow-up check 8/2 8/2 third trip to angiogram: demonstrates resolution of the thrombus throughout the fem-pop venous bypass graft but 2 areas of narrowing distally, at the distal anastomosis, and within the tibioperoneal trunk.  8/3 CT chest: 1.  Extensive bilateral acute interstitial lung disease superimposed on marked changes of COPD. (pulmonary edema vs viral pneumonitis). Small right pleural effusion. Mild mediastinal adenopathy. Dense coronary artery atheromatous calcifications. There is also diffuse distal esophageal wall thickening. This could be due to incomplete distention, esophagitis or mass. These possibilities could be differentiated with direct endoscopic visualization 8/4 echo> LVEF 30-35%, akinesis of anterior wall, grade 2 diastolic dysfunction, PA pressure , RV mildly reduced function  SIGNIFICANT EVENTS: 7/30 admitted for clotted fem-pop bypass graft.  7/31: went to IR for angiogram and TNK.  8/1, 230 p: TNK stopped due to scant hemoptysis. Resumed at 5p. 1/2 dose  8/1 630: hemoptysis returned and was not resumed. Heparin continued.  8/2: increased shortness of breath.  8/2: placed on 100% NRB after returning from angiogram.  8/3 PCCM called. Still very hypoxic on 100% NRB. Had episode where sats dropped to 70s. Heparin stopped due to some episode of hemoptysis.  8/4 diuresis, cards consulted,  LINES/TUBES: PICC 8/3>>  INTERVAL HISTORY:    03/28/14: Subjectively better. On 50% face mask, pulse ox 99%  VITAL SIGNS: Temp:  [97.2 F (36.2 C)-98 F (36.7 C)] 97.3 F (36.3 C) (08/06 0823) Pulse Rate:  [76-100] 76 (08/06 0700) Resp:  [19-36] 21 (08/06 0700) BP: (79-137)/(18-53) 137/18 mmHg (08/06 0600) SpO2:  [87 %-100 %] 99 % (08/06 0700) FiO2 (%):  [55 %] 55 % (08/06 0600) Weight:  [57.3 kg (126 lb 5.2 oz)] 57.3 kg (126 lb 5.2 oz) (08/06 0500) 100%   HEMODYNAMICS: CVP:  [0 mmHg] 0 mmHg VENTILATOR SETTINGS: Vent Mode:  [-]  FiO2 (%):  [55 %] 55 % INTAKE / OUTPUT:  Intake/Output  Summary (Last 24 hours) at 03/28/14 0904 Last data filed at 03/28/14 0700  Gross per 24 hour  Intake 1745.08 ml  Output   2401 ml  Net -655.92 ml    PHYSICAL EXAMINATION: General:  80 year old male, sitting up in bed,  comfortable Neuro:  Awake, oriented, MAEW HEENT: NCAT, EOMs intact, no JVD Cardiovascular: Tachy, regular, no MGR Lungs:  Crackles in bases bilaterally Abdomen:  Soft, no tenderness to palpation Musculoskeletal:  Intact  Skin:  Intact  Ext: No edema  LABS:  PULMONARY  Recent Labs Lab 03/24/14 1306 03/25/14 0830  PHART 7.427 7.407  PCO2ART 32.8* 36.3  PO2ART 97.0 67.8*  HCO3 21.6 22.1  TCO2 23 23.2  O2SAT 98.0 92.3    CBC  Recent Labs Lab 03/27/14 0500 03/27/14 1200 03/28/14 0500  HGB 9.4* 9.8* 9.0*  HCT 27.6* 28.7* 26.7*  WBC 19.9* 20.7* 12.7*  PLT 274 310 295    COAGULATION  Recent Labs Lab 03/21/14 1929  INR 1.17    CARDIAC   Recent Labs Lab 03/25/14 1634 03/25/14 2159 03/26/14 1315 03/26/14 1613 03/26/14 1913  TROPONINI 0.63* 0.94* 0.49* 0.41* 0.42*    Recent Labs Lab 03/25/14 1034 03/26/14 0430  PROBNP 9493.0* 12292.0*     CHEMISTRY  Recent Labs Lab 03/26/14 0430 03/26/14 1315 03/27/14 0500 03/27/14 1200 03/28/14 0500  NA 138 136* 139 134* 139  K 4.3 3.9 3.1* 3.5* 3.1*  CL 104 100 97 92* 97  CO2 25 23 28 28  32  GLUCOSE 101* 122* 185* 307* 116*  BUN 27* 32* 40* 43* 43*  CREATININE 1.02 1.03 1.40* 1.50* 1.55*  CALCIUM 8.2* 8.4 8.2* 8.0* 7.9*   Estimated Creatinine Clearance: 31.8 ml/min (by C-G formula based on Cr of 1.55).   LIVER  Recent Labs Lab 03/21/14 1929  AST 20  ALT 16  ALKPHOS 88  BILITOT 0.3  PROT 7.3  ALBUMIN 3.2*  INR 1.17     INFECTIOUS  Recent Labs Lab 03/25/14 1035 03/26/14 0430 03/27/14 0500  PROCALCITON 0.50 0.79 0.71     ENDOCRINE CBG (last 3)   Recent Labs  03/27/14 2350 03/28/14 0334 03/28/14 0818  GLUCAP 76 116* 108*         IMAGING x48h  Dg Chest Port 1 View  03/28/2014   CLINICAL DATA:  Check endotracheal tube placement  EXAM: PORTABLE CHEST - 1 VIEW  COMPARISON:  03/27/2014  FINDINGS: A right-sided PICC line is again noted and stable. No endotracheal tube is  identified at this time. Diffuse increased density is again noted in the right mid and upper lung as well as the left lower lung. The overall appearance is stable from the prior exam.  IMPRESSION: No significant change from the prior day. No endotracheal tube is noted.   Electronically Signed   By: Alcide Clever M.D.   On: 03/28/2014 07:49   Dg Chest Port 1 View  03/27/2014   CLINICAL DATA:  Lung infiltrates  EXAM: PORTABLE CHEST - 1 VIEW  COMPARISON:  Chest radiograph and chest CT March 25, 2014  FINDINGS: There is underlying emphysematous change. There is a degree of underlying interstitial fibrosis. Widespread interstitial and alveolar edema remain without appreciable change. Heart is mildly enlarged. The pulmonary vascularity is within normal limits. Central catheter tip is in the right atrium slightly distal to the cavoatrial junction, stable. No pneumothorax.  IMPRESSION: Suspect congestive heart failure superimposed on chronic interstitial fibrosis and emphysema. There may well be underlying infectious pneumonia as  well. The appearance is stable compared to 2 days prior.   Electronically Signed   By: Bretta BangWilliam  Woodruff M.D.   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 Virual exacerbation    - he is off lasix gtt. On 50% Face mask and feeling better.    P: Will consult thoracic radiology for CT interpretation esp June 2015 ct abd to weight in presence of UIP/IPF Check autoimmune profile and resp virus profile Start IV steroids (note he gets hives with prednisone, so will cover with benadryl; he and family aware) Continue lasix >  intermittent; track bNP Supplemental oxygen, Goal SpO2>92 ABX broad spectrum ( see infectious) Bronchodilators PRN   CARDIOLOGY: A: NSTEMI, LBBB, tachycardia,and PAC's on EKG Acute CHF exac, LVEF 30-35%, anterior akinesis, BNP- 12292 Hx: HTN CVP unhelpful through PICC Hx: Hyperlipidemia  P: Lasix today Heparin, cautious with no bolus given hemoptysis ASA Per cardiology  INFECTIOUS: A: HCAP? P: Levaquin 8/3>> 8/5 Zosyn 8/3 >> day 4/7 Vanco 8/3 >> stop 8/6)  HEMATOLOGY: A: Anemia, acute on chronic disease( baseline 10.3) [facility]? No clear gi loss, esophageal lesion on CT Hypercoagulability  Aspirin and Heparin   P: Follow CBC Hemoccult stool  RENAL: A: AKI > pre-renal? Contrast? Elevated BUN from steroids?   - low K P: Replete K Monitor with lasix  GI: A: Distal esophageal wall thickening- ??incomplete distension, esophagitis, mass Heart healthy diet Constipation P: Consult GI for possible endoscopy after oxygenation improves Hemoccult blood Advance diet Stool softeners Check scleroderma profile  ENDOCRINE: A: Hx DM P: SSI  GLOBAL: 03/27/14: Discussed with son / patient >>> they are agreeable to short term ventilatory support if required. Would not be able to tolerate FOB unless intubated - would defer for now  03/28/14: D/w son and daughter. TRy empiric steroids.  Will review [place] primary care notes     The patient is critically ill with multiple organ systems failure and requires high complexity decision making for assessment and support, frequent evaluation and titration of therapies, application of advanced monitoring technologies and extensive interpretation of multiple databases.   Critical Care Time devoted to patient care services described in this note is  35  Minutes.  Dr. Kalman ShanMurali Gesenia Bantz, M.D., [facility].C.P Pulmonary and Critical Care Medicine Staff Physician [facility]  Pulmonary and Critical Care Pager: [phone]  [phone], If no answer or between  15:00h - 7:00h: call [phone]  03/28/2014 9:42 AM

## 2014-03-29 ENCOUNTER — Telehealth: Payer: Self-pay | Admitting: Vascular Surgery

## 2014-03-29 LAB — HEPARIN LEVEL (UNFRACTIONATED)
HEPARIN UNFRACTIONATED: 0.16 [IU]/mL — AB (ref 0.30–0.70)
Heparin Unfractionated: 0.93 IU/mL — ABNORMAL HIGH (ref 0.30–0.70)

## 2014-03-29 LAB — MAGNESIUM: Magnesium: 2.1 mg/dL (ref 1.5–2.5)

## 2014-03-29 LAB — CBC
HCT: 27.9 % — ABNORMAL LOW (ref 39.0–52.0)
Hemoglobin: 9.3 g/dL — ABNORMAL LOW (ref 13.0–17.0)
MCH: 30.3 pg (ref 26.0–34.0)
MCHC: 33.3 g/dL (ref 30.0–36.0)
MCV: 90.9 fL (ref 78.0–100.0)
PLATELETS: 301 10*3/uL (ref 150–400)
RBC: 3.07 MIL/uL — ABNORMAL LOW (ref 4.22–5.81)
RDW: 14.7 % (ref 11.5–15.5)
WBC: 10.5 10*3/uL (ref 4.0–10.5)

## 2014-03-29 LAB — ANA: ANA: NEGATIVE

## 2014-03-29 LAB — GLUCOSE, CAPILLARY
GLUCOSE-CAPILLARY: 93 mg/dL (ref 70–99)
Glucose-Capillary: 110 mg/dL — ABNORMAL HIGH (ref 70–99)
Glucose-Capillary: 404 mg/dL — ABNORMAL HIGH (ref 70–99)
Glucose-Capillary: 287 mg/dL — ABNORMAL HIGH (ref 70–99)
Glucose-Capillary: 302 mg/dL — ABNORMAL HIGH (ref 70–99)
Glucose-Capillary: 281 mg/dL — ABNORMAL HIGH (ref 70–99)

## 2014-03-29 LAB — ANTI-DNA ANTIBODY, DOUBLE-STRANDED: ds DNA Ab: <1 IU/mL

## 2014-03-29 LAB — BASIC METABOLIC PANEL
Anion gap: 10 (ref 5–15)
BUN: 42 mg/dL — AB (ref 6–23)
CO2: 30 mEq/L (ref 19–32)
CREATININE: 1.4 mg/dL — AB (ref 0.50–1.35)
Calcium: 8.4 mg/dL (ref 8.4–10.5)
Chloride: 99 mEq/L (ref 96–112)
GFR, EST AFRICAN AMERICAN: 54 mL/min — AB (ref >90)
GFR, EST NON AFRICAN AMERICAN: 47 mL/min — AB (ref >90)
Glucose, Bld: 76 mg/dL (ref 70–99)
POTASSIUM: 4.4 meq/L (ref 3.7–5.3)
Sodium: 139 mEq/L (ref 137–147)

## 2014-03-29 LAB — LIPID PANEL
Cholesterol: 109 mg/dL (ref 0–200)
HDL: 41 mg/dL (ref >39)
LDL Cholesterol: 56 mg/dL (ref 0–99)
Total CHOL/HDL Ratio: 2.7 RATIO
Triglycerides: 60 mg/dL (ref <150)
VLDL: 12 mg/dL (ref 0–40)

## 2014-03-29 LAB — PHOSPHORUS: Phosphorus: 3.3 mg/dL (ref 2.3–4.6)

## 2014-03-29 LAB — ANTI-SCLERODERMA ANTIBODY: Scleroderma (Scl-70) (ENA) Antibody, IgG: <1

## 2014-03-29 LAB — PRO B NATRIURETIC PEPTIDE: Pro B Natriuretic peptide (BNP): 3393 pg/mL — ABNORMAL HIGH (ref 0–450)

## 2014-03-29 MED ORDER — FUROSEMIDE 10 MG/ML IJ SOLN
20.0000 mg | Freq: Once | INTRAMUSCULAR | Status: AC
  Administered 2014-03-29: 20 mg via INTRAVENOUS

## 2014-03-29 MED ORDER — OXYCODONE HCL 5 MG PO TABS
5.0000 mg | ORAL_TABLET | Freq: Four times a day (QID) | ORAL | Status: DC | PRN
  Administered 2014-03-29: 5 mg via ORAL
  Filled 2014-03-29: qty 1

## 2014-03-29 MED ORDER — HEPARIN (PORCINE) IN NACL 100-0.45 UNIT/ML-% IJ SOLN
800.0000 [IU]/h | INTRAMUSCULAR | Status: DC
  Administered 2014-03-31 (×2): 850 [IU]/h via INTRAVENOUS
  Administered 2014-04-02: 800 [IU]/h via INTRAVENOUS
  Filled 2014-03-29 (×5): qty 250

## 2014-03-29 NOTE — Telephone Encounter (Signed)
Message copied by Fredrich Birks on Fri Mar 29, 2014  3:26 PM ------      Message from: Lorin Mercy K      Created: Thu Mar 28, 2014 11:35 AM      Regarding: Schedule                   ----- Message -----         From: Lars Mage, PA-C         Sent: 03/28/2014   8:07 AM           To: [facility] Charge Pool            4 weeks following discharge with duplex scan of his bypass in the office left LE with Dr. Hart Rochester. ------

## 2014-03-29 NOTE — Progress Notes (Addendum)
PULMONARY / CRITICAL CARE MEDICINE  Name: Trevor Morris MRN: [id number] DOB: 10-28-1933    ADMISSION DATE:  03/21/2014 CONSULTATION DATE: Caroleen Hamman  REFERRING MD :  Oneida Alar   CHIEF COMPLAINT:  Acute hypoxic respiratory failure   INITIAL PRESENTATION:  80 year old male w/ known h/o CM allergy. Underwent angiography and TNK administration for lysis of distal clot at his by-pass graft site on 7/31. His post-op course was complicated by episodes of scant hemoptysis. First noted 8/1 and continued so TNK was eventually stopped. He went to arteriogram 3 times total between 7/31 and 8/2. On 8/2 he began to have significant increase in dyspnea and by the time he returned to the SICU after angiogram required 100% NRB. He received lasix and supportive care. PCCM asked to see 8/3 as symptoms had continued to worsen.    reports that he quit smoking about 26 years ago.   has a past medical history of Diabetes mellitus; Hyperlipidemia; Hypertension; Anemia; COPD (chronic obstructive pulmonary disease); and Peripheral vascular disease.   has past surgical history that includes vein bypass graft,aorto-fem-pop.   LINES/TUBES: PICC 8/3>>   SIGNIFICANT EVENTS: 7/30 admitted for clotted fem-pop bypass graft.   7/31: went to IR for angiogram and TNK.   - 7/31: arteriogram: Pelvic angiography demonstrates atherosclerotic plaque at the origin of the right and left common iliac arteries without narrowing greater than 50%. The left external iliac artery is patent. Successful placement of an infusion catheter into a thrombosed left femoral popliteal artery bypass graft. TNK was instituted at 0.5 milligrams/hour  8/1, 230 p: TNK stopped due to scant hemoptysis. Resumed at 5p. 1/2 dose   - 8/1: arteriogram: improvement with near complete lysed cysts of the saphenous vein graft. There continues to be occlusive thrombus distally. The plan is for an additional 24 hr of lysis with a follow-up check 8/2  - 8/1 630:  hemoptysis returned and was not resumed. Heparin continued.  8/2: increased shortness of breath.   - 8/2 third trip to angiogram: demonstrates resolution of the thrombus throughout the fem-pop venous bypass graft but 2 areas of narrowing distally, at the distal anastomosis, and within the tibioperoneal trunk.   - 8/2: placed on 100% NRB after returning from angiogram.   8/3 PCCM called. Still very hypoxic on 100% NRB. Had episode where sats dropped to 70s. Heparin stopped due to some episode of hemoptysis.   - 8/3 CT chest: 1. Extensive bilateral acute interstitial lung disease superimposed on marked changes of COPD. (pulmonary edema vs viral pneumonitis). Small right pleural effusion. Mild mediastinal adenopathy. Dense coronary artery atheromatous calcifications. There is also diffuse distal esophageal wall thickening. This could be due to incomplete distention, esophagitis or mass. These possibilities could be differentiated with direct endoscopic visualization  8/4 diuresis, cards consulted,  - 8/4 echo> LVEF 30-35%, akinesis of anterior wall, grade 2 diastolic dysfunction, PA pressure 60mHg, RV mildly reduced function   03/28/14: Subjectively better. On 50% face mask, pulse ox 99%  - RF - negative, scl-70 - negative, ESR 45  - SsA, SSB, ANA, CCP, DS-DNA  - REsp VIRUS PCR    SUBJECTIVE/OVERNIGHT/INTERVAL HX  87/15: Significantly better after IV steroid yesterday. Down to 3L [place]. Daughter wants his conditioning improved   VITAL SIGNS: Temp:  [97.4 F (36.3 C)-98.7 F (37.1 C)] 97.5 F (36.4 C) (08/07 0806) Pulse Rate:  [67-99] 77 (08/07 0800) Resp:  [15-35] 15 (08/07 0800) BP: (103-128)/(35-51) 128/46 mmHg (08/07 0800) SpO2:  [89 %-100 %] 97 % (  08/07 0800) FiO2 (%):  [55 %] 55 % (08/06 1200) Weight:  [56.8 kg (125 lb 3.5 oz)] 56.8 kg (125 lb 3.5 oz) (08/07 0500) 100%   HEMODYNAMICS:   VENTILATOR SETTINGS: Vent Mode:  [-]  FiO2 (%):  [55 %] 55 % INTAKE / OUTPUT:  Intake/Output  Summary (Last 24 hours) at 03/29/14 0951 Last data filed at 03/29/14 0800  Gross per 24 hour  Intake   1141 ml  Output   2371 ml  Net  -1230 ml    PHYSICAL EXAMINATION: General:  80 year old male, sitting up in chair comfortable, looks really better Neuro:  Awake, oriented, MAEW HEENT: NCAT, EOMs intact, no JVD Cardiovascular: Tachy, regular, no MGR Lungs:  Crackles in bases bilaterally present but improved Abdomen:  Soft, no tenderness to palpation Musculoskeletal:  Intact  Skin:  Intact  Ext: No edema  LABS:  PULMONARY  Recent Labs Lab 03/24/14 1306 03/25/14 0830  PHART 7.427 7.407  PCO2ART 32.8* 36.3  PO2ART 97.0 67.8*  HCO3 21.6 22.1  TCO2 23 23.2  O2SAT 98.0 92.3    CBC  Recent Labs Lab 03/27/14 1200 03/28/14 0500 03/29/14 0500  HGB 9.8* 9.0* 9.3*  HCT 28.7* 26.7* 27.9*  WBC 20.7* 12.7* 10.5  PLT 310 295 301    COAGULATION No results found for this basename: INR,  in the last 168 hours  CARDIAC    Recent Labs Lab 03/25/14 1634 03/25/14 2159 03/26/14 1315 03/26/14 1613 03/26/14 1913  TROPONINI 0.63* 0.94* 0.49* 0.41* 0.42*    Recent Labs Lab 03/25/14 1034 03/26/14 0430 03/29/14 0500  PROBNP 9493.0* 12292.0* 3393.0*     CHEMISTRY  Recent Labs Lab 03/26/14 1315 03/27/14 0500 03/27/14 1200 03/28/14 0500 03/29/14 0500  NA 136* 139 134* 139 139  K 3.9 3.1* 3.5* 3.1* 4.4  CL 100 97 92* 97 99  CO2 23 28 28  32 30  GLUCOSE 122* 185* 307* 116* 76  BUN 32* 40* 43* 43* 42*  CREATININE 1.03 1.40* 1.50* 1.55* 1.40*  CALCIUM 8.4 8.2* 8.0* 7.9* 8.4  MG  --   --   --   --  2.1  PHOS  --   --   --   --  3.3   Estimated Creatinine Clearance: 34.9 ml/min (by C-G formula based on Cr of 1.4).   LIVER No results found for this basename: AST, ALT, ALKPHOS, BILITOT, PROT, ALBUMIN, INR,  in the last 168 hours   INFECTIOUS  Recent Labs Lab 03/25/14 1035 03/26/14 0430 03/27/14 0500  PROCALCITON 0.50 0.79 0.71     ENDOCRINE CBG  (last 3)   Recent Labs  03/28/14 2343 03/29/14 0346 03/29/14 0803  GLUCAP 213* 93 110*         IMAGING x48h  Dg Chest Port 1 View  03/28/2014   CLINICAL DATA:  Check endotracheal tube placement  EXAM: PORTABLE CHEST - 1 VIEW  COMPARISON:  03/27/2014  FINDINGS: A right-sided PICC line is again noted and stable. No endotracheal tube is identified at this time. Diffuse increased density is again noted in the right mid and upper lung as well as the left lower lung. The overall appearance is stable from the prior exam.  IMPRESSION: No significant change from the prior day. No endotracheal tube is noted.   Electronically Signed   By: Inez Catalina M.D.   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 Other resp etiologies include Alveolar Hge (had hemoptysis while on lytics), BOOP,  ALI/ARDS. Virual exacerbation     -On 03/29/14: much improved and down to [facility] after IV steroids and daily lasix  P: Await full  autoimmune profile and resp virus profile from 03/28/14 (so far negative) Continue  IV steroids since 03/28/14; trial without benadryl. Likely will need po medrol to go home with  (note he gets hives with prednisone) Continue lasix > intermittent; reepat lasix 03/29/14 and decide for 03/30/14 Supplemental oxygen, Goal SpO2>92 ABX broad spectrum ( see infectious) Bronchodilators PRN   CARDIOLOGY: A: NSTEMI,  This admit with Acute CHF exac, LVEF 30-35%, anterior akinesis, BNP- 12292  P: Lasix today; CCM directed Heparin,IV gtt per cards and ASA per  cards  INFECTIOUS: A: HCAP? P: Levaquin 8/3>> 8/5 Zosyn 8/3 >> day 5/7 Vanco 8/3 >> stop 8/6  HEMATOLOGY: A: Anemia, acute on chronic disease( baseline 10.3) > due to [facility]? And critical illness  - No clear gi loss, esophageal lesion on CT    P: Follow CBC; PRBC for hgb < 8gm%   RENAL: A: AKI > pre-renal? Contrast? Elevated BUN from steroids? ? Lasix related   - holding steady 03/29/14  P: Monitor with lasix  GI: A: Distal esophageal wall thickening- ??incomplete distension, esophagitis, mass (Scl-70 is negative this admit)  P: Consult GI for possible endoscopy after oxygenation improves Hemoccult blood ? sent Advance diet Stool softeners   ENDOCRINE: A: Hx DM P: SSI   NEURO A: intact but has some pain nos P Monitor Oxycodone prn  GLOBAL: 03/27/14: Discussed with son / patient >>> they are agreeable to short term ventilatory support if required. Would not be able to tolerate FOB unless intubated - would defer for now  03/28/14: D/w son and daughter. TRy empiric steroids.    03/29/14: improved. Updated patient and daughter and d/w Dr Kellie Simmering. Move to SDU. PCCM primary. Continue steroids and ACS.CHF Rx. PCCM followup opd needed. Get PT       Dr. Brand Males, M.D., [facility].C.P Pulmonary and Critical Care Medicine Staff Physician [place] [facility] Pager: [phone], If no answer or between  15:00h - 7:00h: call [phone]  03/29/2014 9:51 AM

## 2014-03-29 NOTE — Telephone Encounter (Signed)
Left message with patient, dpm

## 2014-03-29 NOTE — Progress Notes (Signed)
  [facility] Progress Note  03/29/2014 3:01 PM  Subjective:   No complaints today. Denies pain.    Filed Vitals:   03/29/14 1300  BP:   Pulse: 85  Temp:   Resp: 17    Physical Exam: Extremities:  +PT and DP doppler signals. Foot is warm. Sensation and motor intact.   CBC    Component Value Date/Time   WBC 10.5 03/29/2014 0500   RBC 3.07* 03/29/2014 0500   HGB 9.3* 03/29/2014 0500   HCT 27.9* 03/29/2014 0500   PLT 301 03/29/2014 0500   MCV 90.9 03/29/2014 0500   MCH 30.3 03/29/2014 0500   MCHC 33.3 03/29/2014 0500   RDW 14.7 03/29/2014 0500   LYMPHSABS 0.6* 03/27/2014 1200   MONOABS 1.2* 03/27/2014 1200   EOSABS 0.0 03/27/2014 1200   BASOSABS 0.0 03/27/2014 1200    BMET    Component Value Date/Time   NA 139 03/29/2014 0500   K 4.4 03/29/2014 0500   CL 99 03/29/2014 0500   CO2 30 03/29/2014 0500   GLUCOSE 76 03/29/2014 0500   BUN 42* 03/29/2014 0500   CREATININE 1.40* 03/29/2014 0500   CALCIUM 8.4 03/29/2014 0500   GFRNONAA 47* 03/29/2014 0500   GFRAA 54* 03/29/2014 0500    INR    Component Value Date/Time   INR 1.17 03/21/2014 1929     Intake/Output Summary (Last 24 hours) at 03/29/14 1501 Last data filed at 03/29/14 0800  Gross per 24 hour  Intake    731 ml  Output   1351 ml  Net   -620 ml     Assessment:  80 y.o. male is s/p:  IR thrombolysis LLE  Plan: -Bypass patent with good doppler signals. -Ambulate -Will be transferred to floor today. -Will continue to follow.    [name], PA-C [facility] Office: [phone] Pager: [phone] 03/29/2014 3:01 PM

## 2014-03-29 NOTE — Progress Notes (Signed)
Patient Name: Trevor Morris Date of Encounter: 03/29/2014     Active Problems:   Occlusion of left femoropopliteal bypass graft   Acute respiratory failure with hypoxia   NSTEMI (non-ST elevated myocardial infarction)   COPD (chronic obstructive pulmonary disease)   Acute combined systolic and diastolic heart failure   Hypokalemia    SUBJECTIVE  No CP or SOB last night. Feeling well. Want to do some walking today.   CURRENT MEDS . aspirin EC  325 mg Oral Daily  . atorvastatin  80 mg Oral q1800  . carvedilol  3.125 mg Oral BID WC  . feeding supplement (GLUCERNA SHAKE)  237 mL Oral TID BM  . furosemide  20 mg Intravenous Once  . insulin aspart  0-15 Units Subcutaneous 6 times per day  . insulin glargine  10 Units Subcutaneous Daily  . lisinopril  20 mg Oral Daily  . methylPREDNISolone (SOLU-MEDROL) injection  60 mg Intravenous 3 times per day  . multivitamin with minerals  1 tablet Oral Daily  . pantoprazole  40 mg Oral Daily  . piperacillin-tazobactam (ZOSYN)  IV  3.375 g Intravenous Q8H  . potassium chloride  20-40 mEq Oral Once  . sodium chloride  10-40 mL Intracatheter Q12H  . sodium chloride  3 mL Intravenous Q12H    OBJECTIVE  Filed Vitals:   03/29/14 0600 03/29/14 0700 03/29/14 0800 03/29/14 0806  BP: 128/49  128/46   Pulse: 67 71 77   Temp:    97.5 F (36.4 C)  TempSrc:    Axillary  Resp: 22 22 15    Height:      Weight:      SpO2: 98% 97% 97%     Intake/Output Summary (Last 24 hours) at 03/29/14 1133 Last data filed at 03/29/14 0800  Gross per 24 hour  Intake    981 ml  Output   1921 ml  Net   -940 ml   Filed Weights   03/27/14 0600 03/28/14 0500 03/29/14 0500  Weight: 125 lb 14.1 oz (57.1 kg) 126 lb 5.2 oz (57.3 kg) 125 lb 3.5 oz (56.8 kg)    PHYSICAL EXAM  General: Pleasant, NAD. Neuro: Alert and oriented X 3. Moves all extremities spontaneously. Psych: Normal affect. HEENT:  Normal  Neck: Supple without bruits or JVD. Lungs:  Resp  regular and unlabored, mild bibasilar rale, decrease breath sound, ?inspiratory wheezing (too transient) Heart: RRR no s3, s4, or murmurs. Abdomen: Soft, non-tender, non-distended, BS + x 4.  Extremities: No clubbing, cyanosis or edema. DP/PT/Radials 2+ and equal bilaterally.  Accessory Clinical Findings  CBC  Recent Labs  03/27/14 1200 03/28/14 0500 03/29/14 0500  WBC 20.7* 12.7* 10.5  NEUTROABS 18.9*  --   --   HGB 9.8* 9.0* 9.3*  HCT 28.7* 26.7* 27.9*  MCV 90.3 89.0 90.9  PLT 310 295 301   Basic Metabolic Panel  Recent Labs  03/28/14 0500 03/29/14 0500  NA 139 139  K 3.1* 4.4  CL 97 99  CO2 32 30  GLUCOSE 116* 76  BUN 43* 42*  CREATININE 1.55* 1.40*  CALCIUM 7.9* 8.4  MG  --  2.1  PHOS  --  3.3   Cardiac Enzymes  Recent Labs  03/26/14 1315 03/26/14 1613 03/26/14 1913  TROPONINI 0.49* 0.41* 0.42*   Fasting Lipid Panel  Recent Labs  03/29/14 0500  CHOL 109  HDL 41  LDLCALC 56  TRIG 60  CHOLHDL 2.7    TELE  NSR with LBBB  with HR 60-80s, occasional atrial tach vs SVT with HR 120s, transient. No other significant ventricular ectopy  ECG  NSR with HR 70s, LBBB, TWI in lateral leads which has been persistent since arrival  Radiology/Studies  Dg Chest 2 View  03/21/2014   CLINICAL DATA:  Preoperative evaluation; hypertension ; emphysema  EXAM: CHEST  2 VIEW  COMPARISON:  None.  FINDINGS: There is underlying emphysema. There is extensive interstitial fibrosis bilaterally. There may be a degree of interstitial edema superimposed. There is no airspace consolidation. The the heart size is normal. The pulmonary vascularity reflects underlying emphysema. No adenopathy. No bone lesions.  IMPRESSION: Emphysema with widespread interstitial fibrosis. There may be some superimposed interstitial edema ; a degree of superimposed congestive heart failure cannot be excluded radiographically. There is no airspace consolidation or adenopathy apparent.   Electronically  Signed   By: Bretta Bang M.D.   On: 03/21/2014 21:35   Ct Chest Wo Contrast  03/25/2014   ADDENDUM REPORT: 03/25/2014 16:49  ADDENDUM: There is also diffuse distal esophageal wall thickening. This could be due to incomplete distention, esophagitis or mass. These possibilities could be differentiated with direct endoscopic visualization.   Electronically Signed   By: Gordan Payment M.D.   On: 03/25/2014 16:49   03/25/2014   CLINICAL DATA:  Evaluate lung infiltrates seen on the portable chest earlier today.  EXAM: CT CHEST WITHOUT CONTRAST  TECHNIQUE: Multidetector CT imaging of the chest was performed following the standard protocol without IV contrast.  COMPARISON:  Portable chest obtained earlier today. Abdomen CT dated 02/19/2014.  FINDINGS: Interval diffuse increase in prominence of the interstitial markings throughout the majority of both lungs, including ground-glass opacities. Extensive bullous changes bilaterally. No masses or focal consolidation. Enlarged precarinal node with a short axis diameter of 19.7 mm on image number 23. Mildly enlarged AP window nodes, the largest with a short axis diameter of 9.3 mm on image number 22.  Small right pleural effusion. Dense coronary artery calcifications. Diffuse low density of the blood. Diffuse distal esophageal wall thickening with a maximum thickness of 10.3 mm on image number 49. Unremarkable upper abdomen. Mild thoracic spine degenerative changes.  IMPRESSION: 1. Extensive bilateral acute interstitial lung disease superimposed on marked changes of COPD. Differential considerations include pulmonary edema and viral pneumonitis. 2. Small right pleural effusion. 3. Mild mediastinal adenopathy, possibly reactive. 4. Dense coronary artery atheromatous calcifications. 5. Anemia.  Electronically Signed: By: Gordan Payment M.D. On: 03/25/2014 16:46   Ir Angiogram Extremity Right  03/22/2014   CLINICAL DATA:  Thrombosed left femoral popliteal arterial saphenous vein  bypass graft.  EXAM: IR INFUSION THROMBOL ARTERIAL INITIAL (MS); RIGHT EXTREMITY ARTERIOGRAPHY; IR ULTRASOUND GUIDANCE VASC ACCESS RIGHT  FLUOROSCOPY TIME:  20 min and 12 seconds.  MEDICATIONS AND MEDICAL HISTORY: Versed two mg, Fentanyl 100 mcg.  Additional Medications: Heparin 5000 units.  ANESTHESIA/SEDATION: Moderate sedation time: 60 minutes  CONTRAST:  150 cc Omnipaque 300  PROCEDURE: The procedure, risks, benefits, and alternatives were explained to the patient. Questions regarding the procedure were encouraged and answered. The patient understands and consents to the procedure.  The right groin was prepped with Betadine in a sterile fashion, and a sterile drape was applied covering the operative field. A sterile gown and sterile gloves were used for the procedure.  Under sonographic guidance, a micropuncture needle was inserted into the right common femoral artery and removed over a 018 wire, which was up sized to a Bentson. A 5 French sheath was  inserted. A 4 French omni Flush catheter was advanced over the wire to the aorta. Pelvic angiography was performed.  The Omni Flush catheter was carefully advanced over a glidewire into the left common iliac artery and finally into the left external iliac artery. The catheter was removed over a Rosen wire. The 5 French sheath was then exchanged for a 35 cm 6 French sheath. This could be advanced to the upper right common iliac artery. It could not be advanced across the bifurcation secondary to significant plaque.  A total of 5000 units of heparin was injected peripherally.  A vertebral 5 French catheter was advanced over the [place] wire to the left external iliac artery and angiography of the left groin and proximal thigh were obtained.  The vertebral catheter was then advanced over a stiff glidewire into the thrombosed venous bypass graft, to the level of the knee. The catheter was removed over a Rosen wire. A 135 cm 50 cm infusion length 5 French multi side hole  catheter was then advanced over the [place] wire to the level of the knee. The catheter and sheath were secured in place. TNK at 0.5 milligrams/hour was instituted into the infusion catheter.  FINDINGS: Pelvic angiography demonstrates atherosclerotic plaque at the origin of the right and left common iliac arteries without narrowing greater than 50%. The left external iliac artery is patent.  The left common femoral artery is patent. The left superficial femoral artery is severely diseased. The left femoral popliteal artery is occluded.  The next series of images demonstrate placement of an infusion catheter into the femoral popliteal artery bypass graft.  COMPLICATIONS: None  IMPRESSION: Successful placement of an infusion catheter into a thrombosed left femoral popliteal artery bypass graft. TNK was instituted at 0.5 milligrams/hour.   Electronically Signed   By: Maryclare Bean M.D.   On: 03/22/2014 14:49   Ir Fem Pop Art Pta Mod Sed  03/24/2014   CLINICAL DATA:  48 hr post left lower extremity fem-pop graft lysis.  EXAM: IR THROMB F/U EVAL ART/VEN FINAL DAY; IR FEM POP ART PTA  FLUOROSCOPY TIME:  17 min and 21 seconds.  MEDICATIONS AND MEDICAL HISTORY: Versed 0.5 mg, Fentanyl 25 mcg.  Additional Medications: Heparin 500 units.  ANESTHESIA/SEDATION: Moderate sedation time: 85 minutes  CONTRAST:  50 cc Visipaque 320  PROCEDURE: The procedure, risks, benefits, and alternatives were explained to the patient. Questions regarding the procedure were encouraged and answered. The patient understands and consents to the procedure.  The right groin was prepped with Betadine in a sterile fashion, and a sterile drape was applied covering the operative field. A sterile gown and sterile gloves were used for the procedure.  Contrast was injected into the multi side-hole catheter and a right lower extremity runoff was performed. This demonstrated resolution of the thrombus with residual narrowing at the distal graft to tibioperoneal  trunk anastomosis as well as a distal tibioperoneal trunk stenosis. The heparin was stopped during the procedure. After approximately 20 min, repeat angiogram demonstrates recurrent early occlusion at these areas of narrowing. 500 units of additional heparin was administered.  The infusion catheter was exchanged over a 300 cm stiff a 018 wire for a 4 mm x 2 cm balloon.  A glidewire was advanced through the balloon and across the 2 lesions into the peroneal artery. The glidewire was exchanged for the stiff 018 wire. The 2 lesions were dilated.  Post angioplasty imaging with angiography demonstrates a dissection in the tibioperoneal trunk narrowing location.  This area was read dilated and held in place for 60 seconds. Repeat angiogram demonstrates wide patency at the 2 areas of stenosis.  A full left lower extremity runoff demonstrates patency of the left common femoral artery, bypass graft, tibioperoneal trunk, and with 2 vessel runoff via the posterior tibial and peroneal arteries.  The sheath was removed and hemostasis was achieved with an exo seal device.  FINDINGS: Initial angiogram demonstrates resolution of the thrombus throughout the fem-pop venous bypass graft but 2 areas of narrowing distally, at the distal anastomosis, and within the tibioperoneal trunk.  After angioplasty, initial repeat imaging demonstrates a dissection at the tibioperoneal trunk stenosis.  After repeat angioplasty, the vessel was noted to be widely patent with some irregularity.  Left lower extremity runoff was performed demonstrating patency of the graft, distal anastomosis to the tibioperoneal trunk, and 2 vessel runoff via the posterior tibial artery and peroneal artery.  COMPLICATIONS: None  IMPRESSION: Successful lysis of a femoral popliteal bypass graft and successful angioplasty of 2 areas of infrapopliteal narrowing as described.   Electronically Signed   By: Maryclare Bean M.D.   On: 03/24/2014 12:07   Ir US Guide Vasc Access  Right  03/22/2014   CLINICAL DATA:  Thrombosed left femoral popliteal arterial saphenous vein bypass graft.  EXAM: IR INFUSION THROMBOL ARTERIAL INITIAL (MS); RIGHT EXTREMITY ARTERIOGRAPHY; IR ULTRASOUND GUIDANCE VASC ACCESS RIGHT  FLUOROSCOPY TIME:  20 min and 12 seconds.  MEDICATIONS AND MEDICAL HISTORY: Versed two mg, Fentanyl 100 mcg.  Additional Medications: Heparin 5000 units.  ANESTHESIA/SEDATION: Moderate sedation time: 60 minutes  CONTRAST:  150 cc Omnipaque 300  PROCEDURE: The procedure, risks, benefits, and alternatives were explained to the patient. Questions regarding the procedure were encouraged and answered. The patient understands and consents to the procedure.  The right groin was prepped with Betadine in a sterile fashion, and a sterile drape was applied covering the operative field. A sterile gown and sterile gloves were used for the procedure.  Under sonographic guidance, a micropuncture needle was inserted into the right common femoral artery and removed over a 018 wire, which was up sized to a Bentson. A 5 French sheath was inserted. A 4 French omni Flush catheter was advanced over the wire to the aorta. Pelvic angiography was performed.  The Omni Flush catheter was carefully advanced over a glidewire into the left common iliac artery and finally into the left external iliac artery. The catheter was removed over a Rosen wire. The 5 French sheath was then exchanged for a 35 cm 6 French sheath. This could be advanced to the upper right common iliac artery. It could not be advanced across the bifurcation secondary to significant plaque.  A total of 5000 units of heparin was injected peripherally.  A vertebral 5 French catheter was advanced over the [place] wire to the left external iliac artery and angiography of the left groin and proximal thigh were obtained.  The vertebral catheter was then advanced over a stiff glidewire into the thrombosed venous bypass graft, to the level of the knee. The  catheter was removed over a Rosen wire. A 135 cm 50 cm infusion length 5 French multi side hole catheter was then advanced over the [place] wire to the level of the knee. The catheter and sheath were secured in place. TNK at 0.5 milligrams/hour was instituted into the infusion catheter.  FINDINGS: Pelvic angiography demonstrates atherosclerotic plaque at the origin of the right and left common iliac arteries without narrowing greater  than 50%. The left external iliac artery is patent.  The left common femoral artery is patent. The left superficial femoral artery is severely diseased. The left femoral popliteal artery is occluded.  The next series of images demonstrate placement of an infusion catheter into the femoral popliteal artery bypass graft.  COMPLICATIONS: None  IMPRESSION: Successful placement of an infusion catheter into a thrombosed left femoral popliteal artery bypass graft. TNK was instituted at 0.5 milligrams/hour.   Electronically Signed   By: Maryclare Bean M.D.   On: 03/22/2014 14:49   Dg Chest Port 1 View  03/28/2014   CLINICAL DATA:  Check endotracheal tube placement  EXAM: PORTABLE CHEST - 1 VIEW  COMPARISON:  03/27/2014  FINDINGS: A right-sided PICC line is again noted and stable. No endotracheal tube is identified at this time. Diffuse increased density is again noted in the right mid and upper lung as well as the left lower lung. The overall appearance is stable from the prior exam.  IMPRESSION: No significant change from the prior day. No endotracheal tube is noted.   Electronically Signed   By: Alcide Clever M.D.   On: 03/28/2014 07:49   Dg Chest Port 1 View  03/27/2014   CLINICAL DATA:  Lung infiltrates  EXAM: PORTABLE CHEST - 1 VIEW  COMPARISON:  Chest radiograph and chest CT March 25, 2014  FINDINGS: There is underlying emphysematous change. There is a degree of underlying interstitial fibrosis. Widespread interstitial and alveolar edema remain without appreciable change. Heart is mildly  enlarged. The pulmonary vascularity is within normal limits. Central catheter tip is in the right atrium slightly distal to the cavoatrial junction, stable. No pneumothorax.  IMPRESSION: Suspect congestive heart failure superimposed on chronic interstitial fibrosis and emphysema. There may well be underlying infectious pneumonia as well. The appearance is stable compared to 2 days prior.   Electronically Signed   By: Bretta Bang M.D.   On: 03/27/2014 07:19   Dg Chest Port 1 View  03/25/2014   CLINICAL DATA:  Shortness of breath  EXAM: PORTABLE CHEST - 1 VIEW  COMPARISON:  03/24/2014  FINDINGS: Cardiac shadow is within normal limits. Diffuse bilateral infiltrates are again identified. A PICC line is now been placed on the right with the catheter tip at the cavoatrial junction. No pneumothorax is seen. No other focal abnormality is noted.  IMPRESSION: New PICC line in satisfactory position.  Bilateral parenchymal infiltrates stable from the previous day.   Electronically Signed   By: Alcide Clever M.D.   On: 03/25/2014 09:12   Dg Chest Port 1 View  03/24/2014   CLINICAL DATA:  Short of breath, coughing congestion  EXAM: PORTABLE CHEST - 1 VIEW  COMPARISON:  Prior chest x-ray 03/24/2014 at 3:28 a.m.  FINDINGS: Persistent diffuse bilateral predominantly interstitial opacities. There has been no significant interval change compared to the radiograph from earlier this morning. Cardiac and mediastinal contours remain unchanged. Atherosclerotic calcifications are present within the transverse aorta. Upper abdominal gas pattern is unremarkable. No acute osseous abnormality.  IMPRESSION: No significant interval change in the appearance of the lungs. Persistent diffuse bilateral interstitial opacities which may reflect pulmonary fibrosis, interstitial edema or atypical infection.   Electronically Signed   By: Malachy Moan M.D.   On: 03/24/2014 14:40   Dg Chest Port 1 View  03/24/2014   CLINICAL DATA:   Hemoptysis and wheezing.  EXAM: PORTABLE CHEST - 1 VIEW  COMPARISON:  03/21/2014  FINDINGS: Borderline heart size. Diffuse coarse interstitial  infiltrates throughout both lungs likely representing diffuse interstitial fibrosis. No significant change since previous study, allowing for technical differences. No focal consolidation. No blunting of costophrenic angles. No pneumothorax.  IMPRESSION: Diffuse interstitial infiltrates in the lungs likely fibrosis. No change since prior study.   Electronically Signed   By: Burman Nieves M.D.   On: 03/24/2014 04:05   Ir Infusion Thrombol Arterial Initial (ms)  03/22/2014   CLINICAL DATA:  Thrombosed left femoral popliteal arterial saphenous vein bypass graft.  EXAM: IR INFUSION THROMBOL ARTERIAL INITIAL (MS); RIGHT EXTREMITY ARTERIOGRAPHY; IR ULTRASOUND GUIDANCE VASC ACCESS RIGHT  FLUOROSCOPY TIME:  20 min and 12 seconds.  MEDICATIONS AND MEDICAL HISTORY: Versed two mg, Fentanyl 100 mcg.  Additional Medications: Heparin 5000 units.  ANESTHESIA/SEDATION: Moderate sedation time: 60 minutes  CONTRAST:  150 cc Omnipaque 300  PROCEDURE: The procedure, risks, benefits, and alternatives were explained to the patient. Questions regarding the procedure were encouraged and answered. The patient understands and consents to the procedure.  The right groin was prepped with Betadine in a sterile fashion, and a sterile drape was applied covering the operative field. A sterile gown and sterile gloves were used for the procedure.  Under sonographic guidance, a micropuncture needle was inserted into the right common femoral artery and removed over a 018 wire, which was up sized to a Bentson. A 5 French sheath was inserted. A 4 French omni Flush catheter was advanced over the wire to the aorta. Pelvic angiography was performed.  The Omni Flush catheter was carefully advanced over a glidewire into the left common iliac artery and finally into the left external iliac artery. The catheter  was removed over a Rosen wire. The 5 French sheath was then exchanged for a 35 cm 6 French sheath. This could be advanced to the upper right common iliac artery. It could not be advanced across the bifurcation secondary to significant plaque.  A total of 5000 units of heparin was injected peripherally.  A vertebral 5 French catheter was advanced over the [place] wire to the left external iliac artery and angiography of the left groin and proximal thigh were obtained.  The vertebral catheter was then advanced over a stiff glidewire into the thrombosed venous bypass graft, to the level of the knee. The catheter was removed over a Rosen wire. A 135 cm 50 cm infusion length 5 French multi side hole catheter was then advanced over the [place] wire to the level of the knee. The catheter and sheath were secured in place. TNK at 0.5 milligrams/hour was instituted into the infusion catheter.  FINDINGS: Pelvic angiography demonstrates atherosclerotic plaque at the origin of the right and left common iliac arteries without narrowing greater than 50%. The left external iliac artery is patent.  The left common femoral artery is patent. The left superficial femoral artery is severely diseased. The left femoral popliteal artery is occluded.  The next series of images demonstrate placement of an infusion catheter into the femoral popliteal artery bypass graft.  COMPLICATIONS: None  IMPRESSION: Successful placement of an infusion catheter into a thrombosed left femoral popliteal artery bypass graft. TNK was instituted at 0.5 milligrams/hour.   Electronically Signed   By: Maryclare Bean M.D.   On: 03/22/2014 14:49   Ir Rande Lawman F/u Eval Art/ven Basil Dess Day (ms)  03/23/2014   CLINICAL DATA:  24 hr post left lower extremity arterial lysis check  EXAM: IR THROMB F/U EVAL ART/VEN SUBSEQ DAY (MS)  FLUOROSCOPY TIME:  30 seconds.  MEDICATIONS AND  MEDICAL HISTORY: None  ANESTHESIA/SEDATION: None  CONTRAST:  20 cc Omnipaque 350  PROCEDURE: The  procedure, risks, benefits, and alternatives were explained to the patient. Questions regarding the procedure were encouraged and answered. The patient understands and consents to the procedure.  Contrast was injected into the infusion catheter and imaging was obtained.  FINDINGS: The left common femoral artery remains patent.  The saphenous vein graft is patent to the level of the knee. It is occluded at the knee. The infusion catheter remains in place. Runoff tibial vessels were not seen to opacified.  COMPLICATIONS: None  IMPRESSION: There is improvement with near complete lysed cysts of the saphenous vein graft. There continues to be occlusive thrombus distally. The plan is for an additional 24 hr of lysis with a follow-up check tomorrow morning.   Electronically Signed   By: Maryclare Bean M.D.   On: 03/23/2014 09:12   Ir Rande Lawman F/u Eval Art/ven Final Day (ms)  03/24/2014   CLINICAL DATA:  48 hr post left lower extremity fem-pop graft lysis.  EXAM: IR THROMB F/U EVAL ART/VEN FINAL DAY; IR FEM POP ART PTA  FLUOROSCOPY TIME:  17 min and 21 seconds.  MEDICATIONS AND MEDICAL HISTORY: Versed 0.5 mg, Fentanyl 25 mcg.  Additional Medications: Heparin 500 units.  ANESTHESIA/SEDATION: Moderate sedation time: 85 minutes  CONTRAST:  50 cc Visipaque 320  PROCEDURE: The procedure, risks, benefits, and alternatives were explained to the patient. Questions regarding the procedure were encouraged and answered. The patient understands and consents to the procedure.  The right groin was prepped with Betadine in a sterile fashion, and a sterile drape was applied covering the operative field. A sterile gown and sterile gloves were used for the procedure.  Contrast was injected into the multi side-hole catheter and a right lower extremity runoff was performed. This demonstrated resolution of the thrombus with residual narrowing at the distal graft to tibioperoneal trunk anastomosis as well as a distal tibioperoneal trunk stenosis. The  heparin was stopped during the procedure. After approximately 20 min, repeat angiogram demonstrates recurrent early occlusion at these areas of narrowing. 500 units of additional heparin was administered.  The infusion catheter was exchanged over a 300 cm stiff a 018 wire for a 4 mm x 2 cm balloon.  A glidewire was advanced through the balloon and across the 2 lesions into the peroneal artery. The glidewire was exchanged for the stiff 018 wire. The 2 lesions were dilated.  Post angioplasty imaging with angiography demonstrates a dissection in the tibioperoneal trunk narrowing location. This area was read dilated and held in place for 60 seconds. Repeat angiogram demonstrates wide patency at the 2 areas of stenosis.  A full left lower extremity runoff demonstrates patency of the left common femoral artery, bypass graft, tibioperoneal trunk, and with 2 vessel runoff via the posterior tibial and peroneal arteries.  The sheath was removed and hemostasis was achieved with an exo seal device.  FINDINGS: Initial angiogram demonstrates resolution of the thrombus throughout the fem-pop venous bypass graft but 2 areas of narrowing distally, at the distal anastomosis, and within the tibioperoneal trunk.  After angioplasty, initial repeat imaging demonstrates a dissection at the tibioperoneal trunk stenosis.  After repeat angioplasty, the vessel was noted to be widely patent with some irregularity.  Left lower extremity runoff was performed demonstrating patency of the graft, distal anastomosis to the tibioperoneal trunk, and 2 vessel runoff via the posterior tibial artery and peroneal artery.  COMPLICATIONS: None  IMPRESSION: Successful lysis  of a femoral popliteal bypass graft and successful angioplasty of 2 areas of infrapopliteal narrowing as described.   Electronically Signed   By: Maryclare Bean M.D.   On: 03/24/2014 12:07    ASSESSMENT AND PLAN  1. Acute respiratory failure  - h/o contrast allergy s/p angiography and  TNK for lysis of distal clot of LE bypass graft 7/31. Started to have significant dyspnea on 8/2  - per [facility] IPF flare following angiography  2. NSTEMI with anterior akinesis on echo  - in the setting of severe anemia, trop 0.49 --> 0.41 --> 0.42  - plan for possible cath next week once pulm function improve  - will need pretreatment prior to cath for allergy to omnipaque given recent IPF flare after angiography, does have h/o allergy of prednisone, however tolerating benadryl with solu-medrol. If plan for cath, will need solumedrol, benadryl and pepcid on board.  3. Acute systolic heart failure with EF 30-35%  - ?underlying ischemia vs takotsubo's  4. Anemia 5. Acute renal insufficiency 6. LBBB  Likely able to transfer to floor soon  Hassan Buckler Pager: [phone] Agree with above assessment and plan. Gradual improvement. Rhythm stable NSR. Anticipate possible left heart cath early next week if he continues to improve.

## 2014-03-29 NOTE — Progress Notes (Signed)
Patient ID: Trevor Morris, male   DOB: 13-Sep-1933, 80 y.o.   MRN: [id number] . Vascular Surgery Progress Note  Subjective: Status post lysis left femoral-popliteal bypass graft  Bypass has remained patent left leg   Objective:  Filed Vitals:   03/29/14 1300  BP:   Pulse: 85  Temp:   Resp: 17    General alert and oriented x3 Appears much more comfortable today sitting in chair 3+ popliteal and 2+ posterior tibial pulse palpable left leg   Labs:  Recent Labs Lab 03/27/14 1200 03/28/14 0500 03/29/14 0500  CREATININE 1.50* 1.55* 1.40*    Recent Labs Lab 03/27/14 1200 03/28/14 0500 03/29/14 0500  NA 134* 139 139  K 3.5* 3.1* 4.4  CL 92* 97 99  CO2 28 32 30  BUN 43* 43* 42*  CREATININE 1.50* 1.55* 1.40*  GLUCOSE 307* 116* 76  CALCIUM 8.0* 7.9* 8.4    Recent Labs Lab 03/27/14 1200 03/28/14 0500 03/29/14 0500  WBC 20.7* 12.7* 10.5  HGB 9.8* 9.0* 9.3*  HCT 28.7* 26.7* 27.9*  PLT 310 295 301   No results found for this basename: INR,  in the last 168 hours  I/O last 3 completed shifts: In: 1895.1 [P.O.:960; I.V.:685.1; IV Piggyback:250] Out: 4021 [Urine:4020; Stool:1]  Imaging: Dg Chest Port 1 View  03/28/2014   CLINICAL DATA:  Check endotracheal tube placement  EXAM: PORTABLE CHEST - 1 VIEW  COMPARISON:  03/27/2014  FINDINGS: A right-sided PICC line is again noted and stable. No endotracheal tube is identified at this time. Diffuse increased density is again noted in the right mid and upper lung as well as the left lower lung. The overall appearance is stable from the prior exam.  IMPRESSION: No significant change from the prior day. No endotracheal tube is noted.   Electronically Signed   By: Alcide Clever M.D.   On: 03/28/2014 07:49    Assessment/Plan:   LOS: 8 days  s/p   I will see patient in office 4 weeks post discharge for duplex scan of left femoral-popliteal bypass graft.   Josephina Gip, MD 03/29/2014 3:01 PM

## 2014-03-29 NOTE — Progress Notes (Signed)
ANTICOAGULATION CONSULT NOTE - Follow Up Consult  Pharmacy Consult for Heparin  Indication: NSTEMI, s/p thrombolysis  Allergies  Allergen Reactions  . Prednisone Hives  . Omnipaque [Iohexol] Itching and Rash    Patient Measurements: Height: 5\' 4"  (162.6 cm) Weight: 126 lb 5.2 oz (57.3 kg) IBW/kg (Calculated) : 59.2  Vital Signs: Temp: 97.6 F (36.4 C) (08/06 2358) Temp src: Oral (08/06 2358) BP: 112/41 mmHg (08/06 2200) Pulse Rate: 72 (08/06 2300)  Labs:  Recent Labs  03/26/14 1315 03/26/14 1613 03/26/14 1913 03/27/14 0500 03/27/14 1200  03/28/14 0500 03/28/14 1350 03/28/14 2220  HGB  --   --   --  9.4* 9.8*  --  9.0*  --   --   HCT  --   --   --  27.6* 28.7*  --  26.7*  --   --   PLT  --   --   --  274 310  --  295  --   --   HEPARINUNFRC  --   --   --   --   --   < > 0.91* 0.56 0.43  CREATININE 1.03  --   --  1.40* 1.50*  --  1.55*  --   --   TROPONINI 0.49* 0.41* 0.42*  --   --   --   --   --   --   < > = values in this interval not displayed.  Estimated Creatinine Clearance: 31.8 ml/min (by C-G formula based on Cr of 1.55).   Assessment: 80 y/o M s/p lysis of occluded left fem-pop bypass, also on heparin for NSTEMI, heparin level therapeutic x 2, other labs as above.   Goal of Therapy:  Heparin level 0.3-0.5 units/ml Monitor platelets by anticoagulation protocol: Yes   Plan:  -Continue heparin drip at 950 units/hr -Daily CBC/HL -Monitor for bleeding  Abran Duke 03/29/2014,12:10 AM

## 2014-03-29 NOTE — Progress Notes (Signed)
ANTICOAGULATION CONSULT NOTE - Follow Up Consult  Pharmacy Consult for Heparin Indication: NSTEMI; s/p aortogram and thrombolysis of LLE bypass   Allergies  Allergen Reactions  . Prednisone Hives  . Omnipaque [Iohexol] Itching and Rash    Patient Measurements: Height: 5\' 4"  (162.6 cm) Weight: 125 lb 3.5 oz (56.8 kg) IBW/kg (Calculated) : 59.2 Heparin Dosing Weight: 56 kg  Vital Signs: Temp: 97.2 F (36.2 C) (08/07 1254) Temp src: Oral (08/07 1254) BP: 127/42 mmHg (08/07 2000) Pulse Rate: 77 (08/07 2100)  Labs:  Recent Labs  03/27/14 1200  03/28/14 0500  03/28/14 2220 03/29/14 0500 03/29/14 1001 03/29/14 2032  HGB 9.8*  --  9.0*  --   --  9.3*  --   --   HCT 28.7*  --  26.7*  --   --  27.9*  --   --   PLT 310  --  295  --   --  301  --   --   HEPARINUNFRC  --   < > 0.91*  < > 0.43  --  0.93* 0.16*  CREATININE 1.50*  --  1.55*  --   --  1.40*  --   --   < > = values in this interval not displayed.  Estimated Creatinine Clearance: 34.9 ml/min (by C-G formula based on Cr of 1.4).   Assessment: 80 y/o male with acute occlusion of left femoral-below-knee popliteal bypass s/p lysis with TNK. He has had episodes of intermittent hemoptysis and his heparin drip was stopped 8/3. Pharmacy consulted to resume for NSTEMI with no bolus. Will target low end of goal to minimize bleeding.  Heparin level was held this am and restarted at a lower rate due to elevated lab and reported nose bleed.  H/H was low but stable. Platelets within normal limits.  Repeat heparin level s/p holding and decreased rate is low for goal at 0.16. Patient has accumulated on higher rates. No bleeding reported per RN tonight.   Goal of Therapy:  Heparin level 0.3-0.5 units/ml with previous hemoptysis Monitor platelets by anticoagulation protocol: Yes   Plan:  - Increase heparin to 900 units/hr. - Recheck heparin level in 8 hours (ok to do with AM labs)  Link Snuffer, PharmD, BCPS Clinical  Pharmacist [phone]  03/29/2014 9:08 PM

## 2014-03-29 NOTE — Progress Notes (Signed)
ANTICOAGULATION CONSULT NOTE - Follow Up Consult  Pharmacy Consult for Heparin Indication: NSTEMI; s/p aortogram and thrombolysis of LLE bypass   Allergies  Allergen Reactions  . Prednisone Hives  . Omnipaque [Iohexol] Itching and Rash    Patient Measurements: Height: 5\' 4"  (162.6 cm) Weight: 125 lb 3.5 oz (56.8 kg) IBW/kg (Calculated) : 59.2 Heparin Dosing Weight: 59.6 kg  Vital Signs: Temp: 97.5 F (36.4 C) (08/07 0806) Temp src: Axillary (08/07 0806) BP: 128/46 mmHg (08/07 0800) Pulse Rate: 77 (08/07 0800)  Labs:  Recent Labs  03/26/14 1315 03/26/14 1613 03/26/14 1913  03/27/14 1200  03/28/14 0500 03/28/14 1350 03/28/14 2220 03/29/14 0500 03/29/14 1001  HGB  --   --   --   < > 9.8*  --  9.0*  --   --  9.3*  --   HCT  --   --   --   < > 28.7*  --  26.7*  --   --  27.9*  --   PLT  --   --   --   < > 310  --  295  --   --  301  --   HEPARINUNFRC  --   --   --   --   --   < > 0.91* 0.56 0.43  --  0.93*  CREATININE 1.03  --   --   < > 1.50*  --  1.55*  --   --  1.40*  --   TROPONINI 0.49* 0.41* 0.42*  --   --   --   --   --   --   --   --   < > = values in this interval not displayed.  Estimated Creatinine Clearance: 34.9 ml/min (by C-G formula based on Cr of 1.4).   Assessment: 80 y/o male with acute occlusion of left femoral-below-knee popliteal bypass s/p lysis with TNK. He has had episodes of intermittent hemoptysis and his heparin drip was stopped 8/3. Pharmacy consulted to resume for NSTEMI with no bolus. Will target low end of goal to minimize bleeding.  Heparin level delayed this morning because blood went missing in the tube. Heparin level back and it is SUPRAtherapeutic at 0.93 on 950 units/hr which was previously therapeutic. RN reports patient did have a little nose bleed so will hold heparin and resume at lower rate. H/H are low stable, platelets are normal.  Goal of Therapy:  Heparin level 0.3-0.5 units/ml with previous hemoptysis Monitor platelets  by anticoagulation protocol: Yes   Plan:  - Hold heparin for 45 min and when resumed decrease rate to 800 units/hr - 8 hr heparin level - Daily heparin level and CBC - Monitor closely for s/sx of bleeding   [facility], [address].D., BCPS Clinical Pharmacist Pager: [phone] 03/29/2014 11:00 AM

## 2014-03-29 NOTE — Progress Notes (Signed)
Inpatient Diabetes Program Recommendations  AACE/ADA: New Consensus Statement on Inpatient Glycemic Control (2013)  Target Ranges:  Prepandial:   less than 140 mg/dL      Peak postprandial:   less than 180 mg/dL (1-2 hours)      Critically ill patients:  140 - 180 mg/dL   Reason for Assessment:  Results for Trevor Morris, Trevor Morris (MRN [id number]) as of 03/29/2014 10:01  Ref. Range 03/28/2014 08:18 03/28/2014 11:32 03/28/2014 16:19 03/28/2014 19:42 03/28/2014 23:43 03/29/2014 03:46 03/29/2014 08:03  Glucose-Capillary Latest Range: 70-99 mg/dL 376 (H) 283 (H) 151 (H) 396 (H) 213 (H) 93 110 (H)   Note that CBG's elevated post-prandially.  May consider adding Novolog 4 units tid with meals (to be held if patient eats less than 50%).  Beryl Meager, RN, BC-ADM Inpatient Diabetes Coordinator Pager [phone]

## 2014-03-30 ENCOUNTER — Inpatient Hospital Stay (HOSPITAL_COMMUNITY): Payer: Medicare Other

## 2014-03-30 DIAGNOSIS — I739 Peripheral vascular disease, unspecified: Secondary | ICD-10-CM

## 2014-03-30 DIAGNOSIS — D62 Acute posthemorrhagic anemia: Secondary | ICD-10-CM

## 2014-03-30 DIAGNOSIS — E876 Hypokalemia: Secondary | ICD-10-CM

## 2014-03-30 LAB — BASIC METABOLIC PANEL
Anion gap: 11 (ref 5–15)
BUN: 44 mg/dL — ABNORMAL HIGH (ref 6–23)
CO2: 28 meq/L (ref 19–32)
Calcium: 8.4 mg/dL (ref 8.4–10.5)
Chloride: 96 mEq/L (ref 96–112)
Creatinine, Ser: 1.31 mg/dL (ref 0.50–1.35)
GFR calc Af Amer: 58 mL/min — ABNORMAL LOW (ref >90)
GFR calc non Af Amer: 50 mL/min — ABNORMAL LOW (ref >90)
GLUCOSE: 225 mg/dL — AB (ref 70–99)
POTASSIUM: 4.2 meq/L (ref 3.7–5.3)
SODIUM: 135 meq/L — AB (ref 137–147)

## 2014-03-30 LAB — GLUCOSE, CAPILLARY
GLUCOSE-CAPILLARY: 208 mg/dL — AB (ref 70–99)
GLUCOSE-CAPILLARY: 296 mg/dL — AB (ref 70–99)
GLUCOSE-CAPILLARY: 231 mg/dL — AB (ref 70–99)
Glucose-Capillary: 187 mg/dL — ABNORMAL HIGH (ref 70–99)
Glucose-Capillary: 224 mg/dL — ABNORMAL HIGH (ref 70–99)
Glucose-Capillary: 249 mg/dL — ABNORMAL HIGH (ref 70–99)
Glucose-Capillary: 335 mg/dL — ABNORMAL HIGH (ref 70–99)

## 2014-03-30 LAB — CBC
HEMATOCRIT: 24.9 % — AB (ref 39.0–52.0)
HEMOGLOBIN: 8.6 g/dL — AB (ref 13.0–17.0)
MCH: 30.6 pg (ref 26.0–34.0)
MCHC: 34.5 g/dL (ref 30.0–36.0)
MCV: 88.6 fL (ref 78.0–100.0)
Platelets: 319 10*3/uL (ref 150–400)
RBC: 2.81 MIL/uL — ABNORMAL LOW (ref 4.22–5.81)
RDW: 14.5 % (ref 11.5–15.5)
WBC: 16.5 10*3/uL — AB (ref 4.0–10.5)

## 2014-03-30 LAB — TROPONIN I: Troponin I: 0.34 ng/mL

## 2014-03-30 LAB — PRO B NATRIURETIC PEPTIDE: PRO B NATRI PEPTIDE: 2615 pg/mL — AB (ref 0–450)

## 2014-03-30 LAB — HEPARIN LEVEL (UNFRACTIONATED)
Heparin Unfractionated: 0.36 IU/mL (ref 0.30–0.70)
Heparin Unfractionated: 0.53 IU/mL (ref 0.30–0.70)

## 2014-03-30 LAB — PHOSPHORUS: PHOSPHORUS: 2.8 mg/dL (ref 2.3–4.6)

## 2014-03-30 LAB — MAGNESIUM: MAGNESIUM: 2.1 mg/dL (ref 1.5–2.5)

## 2014-03-30 LAB — PREPARE RBC (CROSSMATCH)

## 2014-03-30 MED ORDER — SODIUM CHLORIDE 0.9 % IV SOLN: Freq: Once | INTRAVENOUS | Status: DC

## 2014-03-30 MED ORDER — ZOLPIDEM TARTRATE 5 MG PO TABS
5.0000 mg | ORAL_TABLET | Freq: Every evening | ORAL | Status: DC | PRN
  Administered 2014-03-30: 5 mg via ORAL
  Filled 2014-03-30: qty 1

## 2014-03-30 MED ORDER — FUROSEMIDE 10 MG/ML IJ SOLN
40.0000 mg | Freq: Once | INTRAMUSCULAR | Status: AC
  Administered 2014-03-30: 40 mg via INTRAVENOUS
  Filled 2014-03-30: qty 4

## 2014-03-30 MED ORDER — PANTOPRAZOLE SODIUM 40 MG PO TBEC
40.0000 mg | DELAYED_RELEASE_TABLET | Freq: Two times a day (BID) | ORAL | Status: DC
  Administered 2014-03-30 – 2014-04-03 (×10): 40 mg via ORAL
  Filled 2014-03-30 (×9): qty 1

## 2014-03-30 MED ORDER — METHYLPREDNISOLONE SODIUM SUCC 40 MG IJ SOLR
40.0000 mg | Freq: Two times a day (BID) | INTRAMUSCULAR | Status: DC
  Administered 2014-03-30 – 2014-04-03 (×8): 40 mg via INTRAVENOUS
  Filled 2014-03-30 (×10): qty 1

## 2014-03-30 MED ORDER — FUROSEMIDE 40 MG PO TABS
40.0000 mg | ORAL_TABLET | Freq: Every day | ORAL | Status: DC
  Administered 2014-03-30 – 2014-04-03 (×5): 40 mg via ORAL
  Filled 2014-03-30 (×5): qty 1

## 2014-03-30 NOTE — Progress Notes (Signed)
PULMONARY / CRITICAL CARE MEDICINE  Name: Trevor Morris MRN: [id number] DOB: Dec 25, 1933    ADMISSION DATE:  03/21/2014 CONSULTATION DATE: Lillia Mountain  REFERRING MD :  Darrick Penna   CHIEF COMPLAINT:  Acute hypoxic respiratory failure   INITIAL PRESENTATION:  80 year old male w/ known h/o CM allergy. Underwent angiography and TNK administration for lysis of distal clot at his by-pass graft site on 7/31. His post-op course was complicated by episodes of scant hemoptysis. First noted 8/1 and continued so TNK was eventually stopped. He went to arteriogram 3 times total between 7/31 and 8/2. On 8/2 he began to have significant increase in dyspnea and by the time he returned to the SICU after angiogram required 100% NRB. He received lasix and supportive care. PCCM asked to see 8/3 as symptoms had continued to worsen.    reports that he quit smoking about 26 years ago.   has a past medical history of Diabetes mellitus; Hyperlipidemia; Hypertension; Anemia; COPD (chronic obstructive pulmonary disease); and Peripheral vascular disease.   has past surgical history that includes vein bypass graft,aorto-fem-pop.   LINES/TUBES: PICC 8/3>>   SIGNIFICANT EVENTS: 7/30 admitted for clotted fem-pop bypass graft.   7/31: went to IR for angiogram and TNK.   - 7/31: arteriogram: Pelvic angiography demonstrates atherosclerotic plaque at the origin of the right and left common iliac arteries without narrowing greater than 50%. The left external iliac artery is patent. Successful placement of an infusion catheter into a thrombosed left femoral popliteal artery bypass graft. TNK was instituted at 0.5 milligrams/hour  8/1, 230 p: TNK stopped due to scant hemoptysis. Resumed at 5p. 1/2 dose   - 8/1: arteriogram: improvement with near complete lysed cysts of the saphenous vein graft. There continues to be occlusive thrombus distally. The plan is for an additional 24 hr of lysis with a follow-up check 8/2  - 8/1 630:  hemoptysis returned and was not resumed. Heparin continued.  8/2: increased shortness of breath.   - 8/2 third trip to angiogram: demonstrates resolution of the thrombus throughout the fem-pop venous bypass graft but 2 areas of narrowing distally, at the distal anastomosis, and within the tibioperoneal trunk.   - 8/2: placed on 100% NRB after returning from angiogram.   8/3 PCCM called. Still very hypoxic on 100% NRB. Had episode where sats dropped to 70s. Heparin stopped due to some episode of hemoptysis.   - 8/3 CT chest: 1. Extensive bilateral acute interstitial lung disease superimposed on marked changes of COPD. (pulmonary edema vs viral pneumonitis). Small right pleural effusion. Mild mediastinal adenopathy. Dense coronary artery atheromatous calcifications. There is also diffuse distal esophageal wall thickening. This could be due to incomplete distention, esophagitis or mass. These possibilities could be differentiated with direct endoscopic visualization  8/4 diuresis, cards consulted,  - 8/4 echo> LVEF 30-35%, akinesis of anterior wall, grade 2 diastolic dysfunction, PA pressure , RV mildly reduced function       SUBJECTIVE/OVERNIGHT/INTERVAL HX Poor night, no sleep, angry over light in hallway. No cough cp or nausea but requiring increased FIO2 to maintain sats      VITAL SIGNS: Temp:  [97.2 F (36.2 C)-98.6 F (37 C)] 97.9 F (36.6 C) (08/08 0747) Pulse Rate:  [71-85] 73 (08/08 0747) Resp:  [17-26] 20 (08/08 0747) BP: (120-136)/(38-76) 135/52 mmHg (08/08 0747) SpO2:  [81 %-100 %] 95 % (08/08 0747) Weight:  [127 lb 13.9 oz (58 kg)] 127 lb 13.9 oz (58 kg) (08/07 2225)  FIO@  NRM  HEMODYNAMICS:   VENTILATOR SETTINGS:   INTAKE / OUTPUT:  Intake/Output Summary (Last 24 hours) at 03/30/14 5638 Last data filed at 03/30/14 0700  Gross per 24 hour  Intake 682.13 ml  Output   1150 ml  Net -467.87 ml    PHYSICAL EXAMINATION: General:  80 year old male, sitting up  in bed uncomfortable, angry Neuro:  Awake, oriented, MAEW HEENT: NCAT, EOMs intact, no JVD Cardiovascular: Tachy, regular, no MGR Lungs:  Crackles in bases bilaterally  - no wheeze Abdomen:  Soft, no tenderness to palpation Musculoskeletal:  Intact  Skin:  Intact  Ext: No edema  LABS:  PULMONARY  Recent Labs Lab 03/24/14 1306 03/25/14 0830  PHART 7.427 7.407  PCO2ART 32.8* 36.3  PO2ART 97.0 67.8*  HCO3 21.6 22.1  TCO2 23 23.2  O2SAT 98.0 92.3    CBC  Recent Labs Lab 03/28/14 0500 03/29/14 0500 03/30/14 0420  HGB 9.0* 9.3* 8.6*  HCT 26.7* 27.9* 24.9*  WBC 12.7* 10.5 16.5*  PLT 295 301 319    COAGULATION No results found for this basename: INR,  in the last 168 hours  CARDIAC    Recent Labs Lab 03/25/14 1634 03/25/14 2159 03/26/14 1315 03/26/14 1613 03/26/14 1913  TROPONINI 0.63* 0.94* 0.49* 0.41* 0.42*    Recent Labs Lab 03/25/14 1034 03/26/14 0430 03/29/14 0500 03/30/14 0420  PROBNP 9493.0* 12292.0* 3393.0* 2615.0*     CHEMISTRY  Recent Labs Lab 03/27/14 0500 03/27/14 1200 03/28/14 0500 03/29/14 0500 03/30/14 0420  NA 139 134* 139 139 135*  K 3.1* 3.5* 3.1* 4.4 4.2  CL 97 92* 97 99 96  CO2 28 28 32 30 28  GLUCOSE 185* 307* 116* 76 225*  BUN 40* 43* 43* 42* 44*  CREATININE 1.40* 1.50* 1.55* 1.40* 1.31  CALCIUM 8.2* 8.0* 7.9* 8.4 8.4  MG  --   --   --  2.1 2.1  PHOS  --   --   --  3.3 2.8   Estimated Creatinine Clearance: 38.1 ml/min (by C-G formula based on Cr of 1.31).   LIVER No results found for this basename: AST, ALT, ALKPHOS, BILITOT, PROT, ALBUMIN, INR,  in the last 168 hours   INFECTIOUS  Recent Labs Lab 03/25/14 1035 03/26/14 0430 03/27/14 0500  PROCALCITON 0.50 0.79 0.71     ENDOCRINE CBG (last 3)   Recent Labs  03/29/14 2342 03/30/14 0352 03/30/14 0748  GLUCAP 335* 224* 208*         IMAGING x48h  No results found.    ASSESSMENT / PLAN: PULMONOLOGY: A: Baseline June 2015 CT abd  done for weight loss - does show undiagnosed ILD findings   - likely UIP/IPF based on age and basal predominance though there is R > L assyemtry  Currently: Acute hypoxemic respiratory failure developed 48h post admission   - DDx : IPF flare following angiography (possible diagnosis, some 5%-10% of IPF diagnosed like this) with Acute systolic CHF/pulm edema, alveolar hemorrhage (received TNK). Other resp etiologies include Alveolar Hge (had hemoptysis while on lytics), BOOP,  ALI/ARDS. Virual exacerbation     -On 03/29/14: much improved and down to [facility] after IV steroids and daily lasix > worse 03/30/14 with elevated st's > stat ekg ordered   P: Await full  autoimmune profile and resp virus profile from 03/28/14 (so far negative) Continue  IV steroids (on them since 03/28/14); trial without benadryl. Likely will need po medrol to go home with  (note he gets hives with prednisone) Continue  lasix > intermittent; 5 Supplemental oxygen, Goal SpO2>92 ABX broad spectrum ( see infectious) Bronchodilators PRN   CARDIOLOGY: A: NSTEMI,  This admit with Acute CHF exac, LVEF 30-35%, anterior akinesis,   P:  Heparin,IV gtt per cards and ASA per cards - stat ekg am 03/30/14 >>>  INFECTIOUS: A: HCAP? P: Levaquin 8/3>> 8/5 Zosyn 8/3 >> day 5/7 Vanco 8/3 >>   8/6  HEMATOLOGY: A: Anemia, acute on chronic disease( baseline 10.3) > [facility]? And critical illness  - No clear gi loss, esophageal lesion on CT - since ? IHD ideally and poor oxygenation need to keep hgb over 9 ideally  P transfuse one unit am 8/8      RENAL: A: AKI > pre-renal? Contrast? Elevated BUN from steroids? ? Lasix related    Lab Results  Component Value Date   CREATININE 1.31 03/30/2014   CREATININE 1.40* 03/29/2014   CREATININE 1.55* 03/28/2014     P: Monitor    GI: A: Distal esophageal wall thickening- ??incomplete distension, esophagitis, mass (Scl-70 is negative this admit)  P: Consulted GI for possible endoscopy  after oxygenation improves Hemoccult blood ? sent Advance diet Stool softeners Added high dose ppi 03/30/14    ENDOCRINE: A: Hx DM P: SSI   NEURO A: intact but has some pain nos P Monitor Oxycodone prn  GLOBAL: 03/27/14: Discussed with son / patient >>> they are agreeable to short term ventilatory support if required. Would not be able to tolerate FOB unless intubated - would defer for now  03/28/14: D/w son and daughter. Try empiric steroids.    03/29/14: improved. Updated patient and daughter and d/w Dr Hart RochesterLawson. Move to SDU. PCCM primary. Continue steroids and ACS.CHF Rx. PCCM followup opd needed. Get PT    The patient is critically ill with multiple organ systems failure and requires high complexity decision making for assessment and support, frequent evaluation and titration of therapies, application of advanced monitoring technologies and extensive interpretation of multiple databases. Critical Care Time devoted to patient care services described in this note is 45 minutes.     Sandrea HughsMichael Wert, MD Pulmonary and Critical Care Medicine [facility] Cell [phone] After 5:30 PM or weekends, call [phone]       03/30/2014 9:37 AM

## 2014-03-30 NOTE — Progress Notes (Signed)
PT Cancellation Note  Patient Details Name: Trevor Morris MRN: [id number] DOB: Mar 28, 1934   Cancelled Treatment:    Reason Eval/Treat Not Completed: Medical issues which prohibited therapy.  Pt had some respiratory issues this am and placed on nonrebreather mask.  MD also addressing come cardiac issues. Will return as able.  Thanks.    INGOLD,Benino Korinek 03/30/2014, 9:39 AM  [facility] [phone] (pager)

## 2014-03-30 NOTE — Progress Notes (Signed)
Pt's SpO2 noted to be in 70s, pt placed on 6L [place], pt instructed to take slow deep breaths. SpO2 up to 80%, pt placed on venti mask at 50% FiO2, SpO2 still at 80%. Pt placed on non-rebreather mask, SpO2 up into the 90s. No s/s of acute distress noted, will continue to monitor pt.

## 2014-03-30 NOTE — Progress Notes (Signed)
Pt's SpO2 dropped to 80% on 6L [place], placed pt on non-rebreather mask. SpO2 back up to 90s. No s/s of acute distress noted.

## 2014-03-30 NOTE — Progress Notes (Signed)
CRITICAL VALUE ALERT  Critical value received:  Troponin 0.34  Date of notification:  03/30/14  Time of notification:  1200  Critical value read back: yes  Nurse who received alert:  Cindy Hazy RN  MD notified (1st page):  Dr. Sherene Sires  Time of first page:  1200  MD notified (2nd page):  Time of second page:  Responding MD:  Dr. Sherene Sires  Time MD responded:  1200

## 2014-03-30 NOTE — Progress Notes (Addendum)
Pt's family stated that pt's daughter "shut off IV because it was beeping" Family educated to not manipulate medical equipment and to notify nurse if IV needs adjusting.

## 2014-03-30 NOTE — Progress Notes (Signed)
ANTICOAGULATION CONSULT NOTE - Follow Up Consult  Pharmacy Consult for Heparin Indication: NSTEMI; s/p aortogram and thrombolysis of LLE bypass   Allergies  Allergen Reactions  . Prednisone Hives  . Omnipaque [Iohexol] Itching and Rash    Patient Measurements: Height: 5\' 5"  (165.1 cm) Weight: 127 lb 13.9 oz (58 kg) IBW/kg (Calculated) : 61.5 Heparin Dosing Weight: 58 kg  Vital Signs: Temp: 97.7 F (36.5 C) (08/08 1600) Temp src: Axillary (08/08 1600) BP: 138/53 mmHg (08/08 1700) Pulse Rate: 78 (08/08 1759)  Labs:  Recent Labs  03/28/14 0500  03/29/14 0500  03/29/14 2032 03/30/14 0420 03/30/14 1100 03/30/14 1730  HGB 9.0*  --  9.3*  --   --  8.6*  --   --   HCT 26.7*  --  27.9*  --   --  24.9*  --   --   PLT 295  --  301  --   --  319  --   --   HEPARINUNFRC 0.91*  < >  --   < > 0.16* 0.36  --  0.53  CREATININE 1.55*  --  1.40*  --   --  1.31  --   --   TROPONINI  --   --   --   --   --   --  0.34*  --   < > = values in this interval not displayed.  Estimated Creatinine Clearance: 38.1 ml/min (by C-G formula based on Cr of 1.31).   Assessment: 80 y/o male with acute occlusion of left femoral-below-knee popliteal bypass s/p lysis with TNK. He has had episodes of intermittent hemoptysis and his heparin drip was stopped 8/3. Pharmacy consulted to resume for NSTEMI with no bolus on 8/5. Will target low end of goal to minimize bleeding. Cardiology planning for cardiac cath one pulmonary function improves.   A heparin level this evening is slightly SUPRAtherapeutic of lower goal range (HL 0.53, goal of 0.3-0.5). Hgb/Hct slight drop, plts wnl. Pt with previous episodes of hemoptysis noted - none today per RN report. Will reduce rate slightly since trending up and slightly above lower goal range.  Goal of Therapy:  Heparin level 0.3-0.5 units/ml with previous hemoptysis Monitor platelets by anticoagulation protocol: Yes   Plan:  1. Reduce heparin drip rate slightly to  850 units/hr (8.5 ml/hr) 2. Will continue to monitor for any signs/symptoms of bleeding and will follow up with heparin level in 8 hours   Georgina Pillion, PharmD, BCPS Clinical Pharmacist Pager: [phone] 03/30/2014 6:46 PM

## 2014-03-30 NOTE — Progress Notes (Signed)
ANTICOAGULATION CONSULT NOTE - Follow Up Consult  Pharmacy Consult for Heparin  Indication: NSTEMI, s/p thrombolysis  Allergies  Allergen Reactions  . Prednisone Hives  . Omnipaque [Iohexol] Itching and Rash    Patient Measurements: Height: 5\' 5"  (165.1 cm) Weight: 127 lb 13.9 oz (58 kg) IBW/kg (Calculated) : 61.5  Vital Signs: Temp: 98.6 F (37 C) (08/08 0356) Temp src: Oral (08/08 0356) BP: 136/76 mmHg (08/07 2342) Pulse Rate: 75 (08/07 2342)  Labs:  Recent Labs  03/27/14 1200  03/28/14 0500  03/29/14 0500 03/29/14 1001 03/29/14 2032 03/30/14 0420  HGB 9.8*  --  9.0*  --  9.3*  --   --  8.6*  HCT 28.7*  --  26.7*  --  27.9*  --   --  24.9*  PLT 310  --  295  --  301  --   --  319  HEPARINUNFRC  --   < > 0.91*  < >  --  0.93* 0.16* 0.36  CREATININE 1.50*  --  1.55*  --  1.40*  --   --   --   < > = values in this interval not displayed.  Estimated Creatinine Clearance: 35.7 ml/min (by C-G formula based on Cr of 1.4).   Assessment: 80 y/o M s/p lysis of occluded left fem-pop bypass, also on heparin for NSTEMI, HL is 0.36 after rate increase.   Goal of Therapy:  Heparin level 0.3-0.5 units/ml Monitor platelets by anticoagulation protocol: Yes   Plan:  -Continue heparin drip at 900 units/hr -1300 HL -Daily CBC/HL -Monitor for bleeding  Abran Duke 03/30/2014,5:11 AM

## 2014-03-30 NOTE — Progress Notes (Signed)
Patient Name: Trevor Morris Date of Encounter: 03/30/2014     Active Problems:   Occlusion of left femoropopliteal bypass graft   Acute respiratory failure with hypoxia   NSTEMI (non-ST elevated myocardial infarction)   COPD (chronic obstructive pulmonary disease)   Acute combined systolic and diastolic heart failure   Hypokalemia    SUBJECTIVE  Continues to worsening respiratory status .Marland Kitchen Significantly hypoxic. Being transfused today. He is net negative -1.5L. Significant O2 requirement. Troponin is trending down. No chest pain.  CURRENT MEDS . sodium chloride   Intravenous Once  . aspirin EC  325 mg Oral Daily  . atorvastatin  80 mg Oral q1800  . carvedilol  3.125 mg Oral BID WC  . feeding supplement (GLUCERNA SHAKE)  237 mL Oral TID BM  . insulin aspart  0-15 Units Subcutaneous 6 times per day  . insulin glargine  10 Units Subcutaneous Daily  . lisinopril  20 mg Oral Daily  . methylPREDNISolone (SOLU-MEDROL) injection  40 mg Intravenous Q12H  . multivitamin with minerals  1 tablet Oral Daily  . pantoprazole  40 mg Oral BID AC  . piperacillin-tazobactam (ZOSYN)  IV  3.375 g Intravenous Q8H  . sodium chloride  10-40 mL Intracatheter Q12H  . sodium chloride  3 mL Intravenous Q12H    OBJECTIVE  Filed Vitals:   03/30/14 1231 03/30/14 1234 03/30/14 1315 03/30/14 1336  BP: 115/51  116/60 130/49  Pulse: 85  83 85  Temp:  97.4 F (36.3 C) 97.9 F (36.6 C) 97.6 F (36.4 C)  TempSrc:  Axillary  Axillary  Resp: 25  23 19   Height:      Weight:      SpO2: 99%  96% 93%    Intake/Output Summary (Last 24 hours) at 03/30/14 1338 Last data filed at 03/30/14 1336  Gross per 24 hour  Intake    849 ml  Output   1150 ml  Net   -301 ml   Filed Weights   03/28/14 0500 03/29/14 0500 03/29/14 2225  Weight: 126 lb 5.2 oz (57.3 kg) 125 lb 3.5 oz (56.8 kg) 127 lb 13.9 oz (58 kg)    PHYSICAL EXAM  General: Seated upright on NRB mask Neuro: Alert and oriented X 3. Moves  all extremities spontaneously. Psych: Normal affect. HEENT:  Normal  Neck: Supple without bruits or JVD. Lungs:  Respirations mildly labored, mild bibasilar rale, decrease breath sound, ?inspiratory wheezing (too transient) Heart: RRR no s3, s4, or murmurs. Abdomen: Soft, non-tender, non-distended, BS + x 4.  Extremities: No clubbing, cyanosis or edema. DP/PT/Radials 2+ and equal bilaterally.  Accessory Clinical Findings  CBC  Recent Labs  03/29/14 0500 03/30/14 0420  WBC 10.5 16.5*  HGB 9.3* 8.6*  HCT 27.9* 24.9*  MCV 90.9 88.6  PLT 301 319   Basic Metabolic Panel  Recent Labs  03/29/14 0500 03/30/14 0420  NA 139 135*  K 4.4 4.2  CL 99 96  CO2 30 28  GLUCOSE 76 225*  BUN 42* 44*  CREATININE 1.40* 1.31  CALCIUM 8.4 8.4  MG 2.1 2.1  PHOS 3.3 2.8   Cardiac Enzymes  Recent Labs  03/30/14 1100  TROPONINI 0.34*   Fasting Lipid Panel  Recent Labs  03/29/14 0500  CHOL 109  HDL 41  LDLCALC 56  TRIG 60  CHOLHDL 2.7    TELE  NSR with LBBB with HR 60-80s, occasional atrial tach vs SVT with HR 120s, transient. No other significant ventricular ectopy  ECG  NSR with HR 70s, LBBB, TWI in lateral leads which has been persistent since arrival  Radiology/Studies  Dg Chest 2 View  03/21/2014   CLINICAL DATA:  Preoperative evaluation; hypertension ; emphysema  EXAM: CHEST  2 VIEW  COMPARISON:  None.  FINDINGS: There is underlying emphysema. There is extensive interstitial fibrosis bilaterally. There may be a degree of interstitial edema superimposed. There is no airspace consolidation. The the heart size is normal. The pulmonary vascularity reflects underlying emphysema. No adenopathy. No bone lesions.  IMPRESSION: Emphysema with widespread interstitial fibrosis. There may be some superimposed interstitial edema ; a degree of superimposed congestive heart failure cannot be excluded radiographically. There is no airspace consolidation or adenopathy apparent.    Electronically Signed   By: Bretta Bang M.D.   On: 03/21/2014 21:35   Ct Chest Wo Contrast  03/25/2014   ADDENDUM REPORT: 03/25/2014 16:49  ADDENDUM: There is also diffuse distal esophageal wall thickening. This could be due to incomplete distention, esophagitis or mass. These possibilities could be differentiated with direct endoscopic visualization.   Electronically Signed   By: Gordan Payment M.D.   On: 03/25/2014 16:49   03/25/2014   CLINICAL DATA:  Evaluate lung infiltrates seen on the portable chest earlier today.  EXAM: CT CHEST WITHOUT CONTRAST  TECHNIQUE: Multidetector CT imaging of the chest was performed following the standard protocol without IV contrast.  COMPARISON:  Portable chest obtained earlier today. Abdomen CT dated 02/19/2014.  FINDINGS: Interval diffuse increase in prominence of the interstitial markings throughout the majority of both lungs, including ground-glass opacities. Extensive bullous changes bilaterally. No masses or focal consolidation. Enlarged precarinal node with a short axis diameter of 19.7 mm on image number 23. Mildly enlarged AP window nodes, the largest with a short axis diameter of 9.3 mm on image number 22.  Small right pleural effusion. Dense coronary artery calcifications. Diffuse low density of the blood. Diffuse distal esophageal wall thickening with a maximum thickness of 10.3 mm on image number 49. Unremarkable upper abdomen. Mild thoracic spine degenerative changes.  IMPRESSION: 1. Extensive bilateral acute interstitial lung disease superimposed on marked changes of COPD. Differential considerations include pulmonary edema and viral pneumonitis. 2. Small right pleural effusion. 3. Mild mediastinal adenopathy, possibly reactive. 4. Dense coronary artery atheromatous calcifications. 5. Anemia.  Electronically Signed: By: Gordan Payment M.D. On: 03/25/2014 16:46   Ir Angiogram Extremity Right  03/22/2014   CLINICAL DATA:  Thrombosed left femoral popliteal arterial  saphenous vein bypass graft.  EXAM: IR INFUSION THROMBOL ARTERIAL INITIAL (MS); RIGHT EXTREMITY ARTERIOGRAPHY; IR ULTRASOUND GUIDANCE VASC ACCESS RIGHT  FLUOROSCOPY TIME:  20 min and 12 seconds.  MEDICATIONS AND MEDICAL HISTORY: Versed two mg, Fentanyl 100 mcg.  Additional Medications: Heparin 5000 units.  ANESTHESIA/SEDATION: Moderate sedation time: 60 minutes  CONTRAST:  150 cc Omnipaque 300  PROCEDURE: The procedure, risks, benefits, and alternatives were explained to the patient. Questions regarding the procedure were encouraged and answered. The patient understands and consents to the procedure.  The right groin was prepped with Betadine in a sterile fashion, and a sterile drape was applied covering the operative field. A sterile gown and sterile gloves were used for the procedure.  Under sonographic guidance, a micropuncture needle was inserted into the right common femoral artery and removed over a 018 wire, which was up sized to a Bentson. A 5 French sheath was inserted. A 4 French omni Flush catheter was advanced over the wire to the aorta. Pelvic angiography was  performed.  The Omni Flush catheter was carefully advanced over a glidewire into the left common iliac artery and finally into the left external iliac artery. The catheter was removed over a Rosen wire. The 5 French sheath was then exchanged for a 35 cm 6 French sheath. This could be advanced to the upper right common iliac artery. It could not be advanced across the bifurcation secondary to significant plaque.  A total of 5000 units of heparin was injected peripherally.  A vertebral 5 French catheter was advanced over the [place] wire to the left external iliac artery and angiography of the left groin and proximal thigh were obtained.  The vertebral catheter was then advanced over a stiff glidewire into the thrombosed venous bypass graft, to the level of the knee. The catheter was removed over a Rosen wire. A 135 cm 50 cm infusion length 5 French  multi side hole catheter was then advanced over the [place] wire to the level of the knee. The catheter and sheath were secured in place. TNK at 0.5 milligrams/hour was instituted into the infusion catheter.  FINDINGS: Pelvic angiography demonstrates atherosclerotic plaque at the origin of the right and left common iliac arteries without narrowing greater than 50%. The left external iliac artery is patent.  The left common femoral artery is patent. The left superficial femoral artery is severely diseased. The left femoral popliteal artery is occluded.  The next series of images demonstrate placement of an infusion catheter into the femoral popliteal artery bypass graft.  COMPLICATIONS: None  IMPRESSION: Successful placement of an infusion catheter into a thrombosed left femoral popliteal artery bypass graft. TNK was instituted at 0.5 milligrams/hour.   Electronically Signed   By: Maryclare Bean M.D.   On: 03/22/2014 14:49   Ir Fem Pop Art Pta Mod Sed  03/24/2014   CLINICAL DATA:  48 hr post left lower extremity fem-pop graft lysis.  EXAM: IR THROMB F/U EVAL ART/VEN FINAL DAY; IR FEM POP ART PTA  FLUOROSCOPY TIME:  17 min and 21 seconds.  MEDICATIONS AND MEDICAL HISTORY: Versed 0.5 mg, Fentanyl 25 mcg.  Additional Medications: Heparin 500 units.  ANESTHESIA/SEDATION: Moderate sedation time: 85 minutes  CONTRAST:  50 cc Visipaque 320  PROCEDURE: The procedure, risks, benefits, and alternatives were explained to the patient. Questions regarding the procedure were encouraged and answered. The patient understands and consents to the procedure.  The right groin was prepped with Betadine in a sterile fashion, and a sterile drape was applied covering the operative field. A sterile gown and sterile gloves were used for the procedure.  Contrast was injected into the multi side-hole catheter and a right lower extremity runoff was performed. This demonstrated resolution of the thrombus with residual narrowing at the distal graft to  tibioperoneal trunk anastomosis as well as a distal tibioperoneal trunk stenosis. The heparin was stopped during the procedure. After approximately 20 min, repeat angiogram demonstrates recurrent early occlusion at these areas of narrowing. 500 units of additional heparin was administered.  The infusion catheter was exchanged over a 300 cm stiff a 018 wire for a 4 mm x 2 cm balloon.  A glidewire was advanced through the balloon and across the 2 lesions into the peroneal artery. The glidewire was exchanged for the stiff 018 wire. The 2 lesions were dilated.  Post angioplasty imaging with angiography demonstrates a dissection in the tibioperoneal trunk narrowing location. This area was read dilated and held in place for 60 seconds. Repeat angiogram demonstrates wide patency at  the 2 areas of stenosis.  A full left lower extremity runoff demonstrates patency of the left common femoral artery, bypass graft, tibioperoneal trunk, and with 2 vessel runoff via the posterior tibial and peroneal arteries.  The sheath was removed and hemostasis was achieved with an exo seal device.  FINDINGS: Initial angiogram demonstrates resolution of the thrombus throughout the fem-pop venous bypass graft but 2 areas of narrowing distally, at the distal anastomosis, and within the tibioperoneal trunk.  After angioplasty, initial repeat imaging demonstrates a dissection at the tibioperoneal trunk stenosis.  After repeat angioplasty, the vessel was noted to be widely patent with some irregularity.  Left lower extremity runoff was performed demonstrating patency of the graft, distal anastomosis to the tibioperoneal trunk, and 2 vessel runoff via the posterior tibial artery and peroneal artery.  COMPLICATIONS: None  IMPRESSION: Successful lysis of a femoral popliteal bypass graft and successful angioplasty of 2 areas of infrapopliteal narrowing as described.   Electronically Signed   By: Maryclare BeanArt  Hoss M.D.   On: 03/24/2014 12:07   Ir [place] Guide  Vasc Access Right  03/22/2014   CLINICAL DATA:  Thrombosed left femoral popliteal arterial saphenous vein bypass graft.  EXAM: IR INFUSION THROMBOL ARTERIAL INITIAL (MS); RIGHT EXTREMITY ARTERIOGRAPHY; IR ULTRASOUND GUIDANCE VASC ACCESS RIGHT  FLUOROSCOPY TIME:  20 min and 12 seconds.  MEDICATIONS AND MEDICAL HISTORY: Versed two mg, Fentanyl 100 mcg.  Additional Medications: Heparin 5000 units.  ANESTHESIA/SEDATION: Moderate sedation time: 60 minutes  CONTRAST:  150 cc Omnipaque 300  PROCEDURE: The procedure, risks, benefits, and alternatives were explained to the patient. Questions regarding the procedure were encouraged and answered. The patient understands and consents to the procedure.  The right groin was prepped with Betadine in a sterile fashion, and a sterile drape was applied covering the operative field. A sterile gown and sterile gloves were used for the procedure.  Under sonographic guidance, a micropuncture needle was inserted into the right common femoral artery and removed over a 018 wire, which was up sized to a Bentson. A 5 French sheath was inserted. A 4 French omni Flush catheter was advanced over the wire to the aorta. Pelvic angiography was performed.  The Omni Flush catheter was carefully advanced over a glidewire into the left common iliac artery and finally into the left external iliac artery. The catheter was removed over a Rosen wire. The 5 French sheath was then exchanged for a 35 cm 6 French sheath. This could be advanced to the upper right common iliac artery. It could not be advanced across the bifurcation secondary to significant plaque.  A total of 5000 units of heparin was injected peripherally.  A vertebral 5 French catheter was advanced over the [place] wire to the left external iliac artery and angiography of the left groin and proximal thigh were obtained.  The vertebral catheter was then advanced over a stiff glidewire into the thrombosed venous bypass graft, to the level of the  knee. The catheter was removed over a Rosen wire. A 135 cm 50 cm infusion length 5 French multi side hole catheter was then advanced over the [place] wire to the level of the knee. The catheter and sheath were secured in place. TNK at 0.5 milligrams/hour was instituted into the infusion catheter.  FINDINGS: Pelvic angiography demonstrates atherosclerotic plaque at the origin of the right and left common iliac arteries without narrowing greater than 50%. The left external iliac artery is patent.  The left common femoral artery is patent. The  left superficial femoral artery is severely diseased. The left femoral popliteal artery is occluded.  The next series of images demonstrate placement of an infusion catheter into the femoral popliteal artery bypass graft.  COMPLICATIONS: None  IMPRESSION: Successful placement of an infusion catheter into a thrombosed left femoral popliteal artery bypass graft. TNK was instituted at 0.5 milligrams/hour.   Electronically Signed   By: Maryclare Bean M.D.   On: 03/22/2014 14:49   Dg Chest Port 1 View  03/28/2014   CLINICAL DATA:  Check endotracheal tube placement  EXAM: PORTABLE CHEST - 1 VIEW  COMPARISON:  03/27/2014  FINDINGS: A right-sided PICC line is again noted and stable. No endotracheal tube is identified at this time. Diffuse increased density is again noted in the right mid and upper lung as well as the left lower lung. The overall appearance is stable from the prior exam.  IMPRESSION: No significant change from the prior day. No endotracheal tube is noted.   Electronically Signed   By: Alcide Clever M.D.   On: 03/28/2014 07:49   Dg Chest Port 1 View  03/27/2014   CLINICAL DATA:  Lung infiltrates  EXAM: PORTABLE CHEST - 1 VIEW  COMPARISON:  Chest radiograph and chest CT March 25, 2014  FINDINGS: There is underlying emphysematous change. There is a degree of underlying interstitial fibrosis. Widespread interstitial and alveolar edema remain without appreciable change. Heart is  mildly enlarged. The pulmonary vascularity is within normal limits. Central catheter tip is in the right atrium slightly distal to the cavoatrial junction, stable. No pneumothorax.  IMPRESSION: Suspect congestive heart failure superimposed on chronic interstitial fibrosis and emphysema. There may well be underlying infectious pneumonia as well. The appearance is stable compared to 2 days prior.   Electronically Signed   By: Bretta Bang M.D.   On: 03/27/2014 07:19   Dg Chest Port 1 View  03/25/2014   CLINICAL DATA:  Shortness of breath  EXAM: PORTABLE CHEST - 1 VIEW  COMPARISON:  03/24/2014  FINDINGS: Cardiac shadow is within normal limits. Diffuse bilateral infiltrates are again identified. A PICC line is now been placed on the right with the catheter tip at the cavoatrial junction. No pneumothorax is seen. No other focal abnormality is noted.  IMPRESSION: New PICC line in satisfactory position.  Bilateral parenchymal infiltrates stable from the previous day.   Electronically Signed   By: Alcide Clever M.D.   On: 03/25/2014 09:12   Dg Chest Port 1 View  03/24/2014   CLINICAL DATA:  Short of breath, coughing congestion  EXAM: PORTABLE CHEST - 1 VIEW  COMPARISON:  Prior chest x-ray 03/24/2014 at 3:28 a.m.  FINDINGS: Persistent diffuse bilateral predominantly interstitial opacities. There has been no significant interval change compared to the radiograph from earlier this morning. Cardiac and mediastinal contours remain unchanged. Atherosclerotic calcifications are present within the transverse aorta. Upper abdominal gas pattern is unremarkable. No acute osseous abnormality.  IMPRESSION: No significant interval change in the appearance of the lungs. Persistent diffuse bilateral interstitial opacities which may reflect pulmonary fibrosis, interstitial edema or atypical infection.   Electronically Signed   By: Malachy Moan M.D.   On: 03/24/2014 14:40   Dg Chest Port 1 View  03/24/2014   CLINICAL DATA:   Hemoptysis and wheezing.  EXAM: PORTABLE CHEST - 1 VIEW  COMPARISON:  03/21/2014  FINDINGS: Borderline heart size. Diffuse coarse interstitial infiltrates throughout both lungs likely representing diffuse interstitial fibrosis. No significant change since previous study, allowing for technical  differences. No focal consolidation. No blunting of costophrenic angles. No pneumothorax.  IMPRESSION: Diffuse interstitial infiltrates in the lungs likely fibrosis. No change since prior study.   Electronically Signed   By: Burman Nieves M.D.   On: 03/24/2014 04:05   Ir Infusion Thrombol Arterial Initial (ms)  03/22/2014   CLINICAL DATA:  Thrombosed left femoral popliteal arterial saphenous vein bypass graft.  EXAM: IR INFUSION THROMBOL ARTERIAL INITIAL (MS); RIGHT EXTREMITY ARTERIOGRAPHY; IR ULTRASOUND GUIDANCE VASC ACCESS RIGHT  FLUOROSCOPY TIME:  20 min and 12 seconds.  MEDICATIONS AND MEDICAL HISTORY: Versed two mg, Fentanyl 100 mcg.  Additional Medications: Heparin 5000 units.  ANESTHESIA/SEDATION: Moderate sedation time: 60 minutes  CONTRAST:  150 cc Omnipaque 300  PROCEDURE: The procedure, risks, benefits, and alternatives were explained to the patient. Questions regarding the procedure were encouraged and answered. The patient understands and consents to the procedure.  The right groin was prepped with Betadine in a sterile fashion, and a sterile drape was applied covering the operative field. A sterile gown and sterile gloves were used for the procedure.  Under sonographic guidance, a micropuncture needle was inserted into the right common femoral artery and removed over a 018 wire, which was up sized to a Bentson. A 5 French sheath was inserted. A 4 French omni Flush catheter was advanced over the wire to the aorta. Pelvic angiography was performed.  The Omni Flush catheter was carefully advanced over a glidewire into the left common iliac artery and finally into the left external iliac artery. The catheter  was removed over a Rosen wire. The 5 French sheath was then exchanged for a 35 cm 6 French sheath. This could be advanced to the upper right common iliac artery. It could not be advanced across the bifurcation secondary to significant plaque.  A total of 5000 units of heparin was injected peripherally.  A vertebral 5 French catheter was advanced over the [place] wire to the left external iliac artery and angiography of the left groin and proximal thigh were obtained.  The vertebral catheter was then advanced over a stiff glidewire into the thrombosed venous bypass graft, to the level of the knee. The catheter was removed over a Rosen wire. A 135 cm 50 cm infusion length 5 French multi side hole catheter was then advanced over the [place] wire to the level of the knee. The catheter and sheath were secured in place. TNK at 0.5 milligrams/hour was instituted into the infusion catheter.  FINDINGS: Pelvic angiography demonstrates atherosclerotic plaque at the origin of the right and left common iliac arteries without narrowing greater than 50%. The left external iliac artery is patent.  The left common femoral artery is patent. The left superficial femoral artery is severely diseased. The left femoral popliteal artery is occluded.  The next series of images demonstrate placement of an infusion catheter into the femoral popliteal artery bypass graft.  COMPLICATIONS: None  IMPRESSION: Successful placement of an infusion catheter into a thrombosed left femoral popliteal artery bypass graft. TNK was instituted at 0.5 milligrams/hour.   Electronically Signed   By: Maryclare Bean M.D.   On: 03/22/2014 14:49   Ir Rande Lawman F/u Eval Art/ven Basil Dess Day (ms)  03/23/2014   CLINICAL DATA:  24 hr post left lower extremity arterial lysis check  EXAM: IR THROMB F/U EVAL ART/VEN SUBSEQ DAY (MS)  FLUOROSCOPY TIME:  30 seconds.  MEDICATIONS AND MEDICAL HISTORY: None  ANESTHESIA/SEDATION: None  CONTRAST:  20 cc Omnipaque 350  PROCEDURE: The  procedure,  risks, benefits, and alternatives were explained to the patient. Questions regarding the procedure were encouraged and answered. The patient understands and consents to the procedure.  Contrast was injected into the infusion catheter and imaging was obtained.  FINDINGS: The left common femoral artery remains patent.  The saphenous vein graft is patent to the level of the knee. It is occluded at the knee. The infusion catheter remains in place. Runoff tibial vessels were not seen to opacified.  COMPLICATIONS: None  IMPRESSION: There is improvement with near complete lysed cysts of the saphenous vein graft. There continues to be occlusive thrombus distally. The plan is for an additional 24 hr of lysis with a follow-up check tomorrow morning.   Electronically Signed   By: Maryclare Bean M.D.   On: 03/23/2014 09:12   Ir Rande Lawman F/u Eval Art/ven Final Day (ms)  03/24/2014   CLINICAL DATA:  48 hr post left lower extremity fem-pop graft lysis.  EXAM: IR THROMB F/U EVAL ART/VEN FINAL DAY; IR FEM POP ART PTA  FLUOROSCOPY TIME:  17 min and 21 seconds.  MEDICATIONS AND MEDICAL HISTORY: Versed 0.5 mg, Fentanyl 25 mcg.  Additional Medications: Heparin 500 units.  ANESTHESIA/SEDATION: Moderate sedation time: 85 minutes  CONTRAST:  50 cc Visipaque 320  PROCEDURE: The procedure, risks, benefits, and alternatives were explained to the patient. Questions regarding the procedure were encouraged and answered. The patient understands and consents to the procedure.  The right groin was prepped with Betadine in a sterile fashion, and a sterile drape was applied covering the operative field. A sterile gown and sterile gloves were used for the procedure.  Contrast was injected into the multi side-hole catheter and a right lower extremity runoff was performed. This demonstrated resolution of the thrombus with residual narrowing at the distal graft to tibioperoneal trunk anastomosis as well as a distal tibioperoneal trunk stenosis. The  heparin was stopped during the procedure. After approximately 20 min, repeat angiogram demonstrates recurrent early occlusion at these areas of narrowing. 500 units of additional heparin was administered.  The infusion catheter was exchanged over a 300 cm stiff a 018 wire for a 4 mm x 2 cm balloon.  A glidewire was advanced through the balloon and across the 2 lesions into the peroneal artery. The glidewire was exchanged for the stiff 018 wire. The 2 lesions were dilated.  Post angioplasty imaging with angiography demonstrates a dissection in the tibioperoneal trunk narrowing location. This area was read dilated and held in place for 60 seconds. Repeat angiogram demonstrates wide patency at the 2 areas of stenosis.  A full left lower extremity runoff demonstrates patency of the left common femoral artery, bypass graft, tibioperoneal trunk, and with 2 vessel runoff via the posterior tibial and peroneal arteries.  The sheath was removed and hemostasis was achieved with an exo seal device.  FINDINGS: Initial angiogram demonstrates resolution of the thrombus throughout the fem-pop venous bypass graft but 2 areas of narrowing distally, at the distal anastomosis, and within the tibioperoneal trunk.  After angioplasty, initial repeat imaging demonstrates a dissection at the tibioperoneal trunk stenosis.  After repeat angioplasty, the vessel was noted to be widely patent with some irregularity.  Left lower extremity runoff was performed demonstrating patency of the graft, distal anastomosis to the tibioperoneal trunk, and 2 vessel runoff via the posterior tibial artery and peroneal artery.  COMPLICATIONS: None  IMPRESSION: Successful lysis of a femoral popliteal bypass graft and successful angioplasty of 2 areas of infrapopliteal narrowing as described.  Electronically Signed   By: Maryclare Bean M.D.   On: 03/24/2014 12:07    ASSESSMENT AND PLAN  1. Acute respiratory failure  - h/o contrast allergy s/p angiography and  TNK for lysis of distal clot of LE bypass graft 7/31. Started to have   significant dyspnea on 8/2,   - per [facility] IPF flare following angiography  2. NSTEMI with anterior akinesis on echo  - in the setting of severe anemia, trop 0.49 --> 0.41 --> 0.42  - plan for possible [facility] cath next week once pulm function improves  - will need pretreatment prior to cath for allergy to omnipaque given recent IPF flare after angiography, does   have h/o allergy of prednisone, however tolerating benadryl with solu-medrol. If plan for cath, will need   solumedrol, benadryl and pepcid on board.  3. Acute systolic heart failure with EF 30-35%  - ?underlying ischemia vs takotsubo's, given LBBB, catheterization is recommended, but not safe to do that   from a respiratory standpoint.  - start maintenace lasix 40 mg daily  4. Anemia - being transfused 1U PRBC's now 5. Acute renal insufficiency - creatinine is slowly improving 6. LBBB  Plan: Would work toward right and left heart catheterization next week given cardiomyopathy, persistent dyspnea and hypoxia, as well as LBBB and small NSTEMI, which is likely Type II Mediated. As mentioned, he will need contrast dye prophylaxis.  He appears euvolemic today, but will likely require maintenance diuretics - start lasix 40 mg daily, especially since he is getting blood products today.  Chrystie Nose, MD, [facility] Attending Cardiologist [facility]

## 2014-03-30 NOTE — Progress Notes (Signed)
PT Cancellation Note  Patient Details Name: Trevor Morris MRN: [id number] DOB: [birth year]   Cancelled Treatment:    Reason Eval/Treat Not Completed: Medical issues which prohibited therapy.  When PT checked back in pm, pt still on nonrebreather mask and receiving blood.  Nursing asked PT to defer today.  Will return and evaluate as able tomorrow.  Thanks .   INGOLD,Timonthy Hovater 03/30/2014, 3:11 PM [facility] [phone] (pager)

## 2014-03-31 LAB — BASIC METABOLIC PANEL
Anion gap: 11 (ref 5–15)
BUN: 42 mg/dL — ABNORMAL HIGH (ref 6–23)
CO2: 30 meq/L (ref 19–32)
CREATININE: 1.26 mg/dL (ref 0.50–1.35)
Calcium: 8.4 mg/dL (ref 8.4–10.5)
Chloride: 91 mEq/L — ABNORMAL LOW (ref 96–112)
GFR calc Af Amer: 61 mL/min — ABNORMAL LOW (ref >90)
GFR, EST NON AFRICAN AMERICAN: 53 mL/min — AB (ref >90)
GLUCOSE: 178 mg/dL — AB (ref 70–99)
Potassium: 4.2 mEq/L (ref 3.7–5.3)
SODIUM: 132 meq/L — AB (ref 137–147)

## 2014-03-31 LAB — GLUCOSE, CAPILLARY
GLUCOSE-CAPILLARY: 197 mg/dL — AB (ref 70–99)
Glucose-Capillary: 184 mg/dL — ABNORMAL HIGH (ref 70–99)
Glucose-Capillary: 130 mg/dL — ABNORMAL HIGH (ref 70–99)
Glucose-Capillary: 315 mg/dL — ABNORMAL HIGH (ref 70–99)
Glucose-Capillary: 230 mg/dL — ABNORMAL HIGH (ref 70–99)
Glucose-Capillary: 102 mg/dL — ABNORMAL HIGH (ref 70–99)

## 2014-03-31 LAB — CBC
HCT: 27.1 % — ABNORMAL LOW (ref 39.0–52.0)
HEMOGLOBIN: 9.3 g/dL — AB (ref 13.0–17.0)
MCH: 29.7 pg (ref 26.0–34.0)
MCHC: 34.3 g/dL (ref 30.0–36.0)
MCV: 86.6 fL (ref 78.0–100.0)
Platelets: 273 10*3/uL (ref 150–400)
RBC: 3.13 MIL/uL — AB (ref 4.22–5.81)
RDW: 15.5 % (ref 11.5–15.5)
WBC: 17.1 10*3/uL — ABNORMAL HIGH (ref 4.0–10.5)

## 2014-03-31 LAB — TYPE AND SCREEN
ABO/RH(D): A POS
Antibody Screen: NEGATIVE
UNIT DIVISION: 0

## 2014-03-31 LAB — HEPARIN LEVEL (UNFRACTIONATED): HEPARIN UNFRACTIONATED: 0.5 [IU]/mL (ref 0.30–0.70)

## 2014-03-31 LAB — MAGNESIUM: Magnesium: 2 mg/dL (ref 1.5–2.5)

## 2014-03-31 LAB — PHOSPHORUS: Phosphorus: 3 mg/dL (ref 2.3–4.6)

## 2014-03-31 MED ORDER — FUROSEMIDE 10 MG/ML IJ SOLN
40.0000 mg | Freq: Once | INTRAMUSCULAR | Status: AC
  Administered 2014-03-31: 40 mg via INTRAVENOUS
  Filled 2014-03-31: qty 4

## 2014-03-31 NOTE — Progress Notes (Signed)
PULMONARY / CRITICAL CARE MEDICINE  Name: Trevor Morris MRN: [id number] DOB: 04-12-1934    ADMISSION DATE:  03/21/2014 CONSULTATION DATE: Lillia Mountain  REFERRING MD :  Darrick Penna   CHIEF COMPLAINT:  Acute hypoxic respiratory failure   INITIAL PRESENTATION:  80 year old male w/ known h/o CM allergy. Underwent angiography and TNK administration for lysis of distal clot at his by-pass graft site on 7/31. His post-op course was complicated by episodes of scant hemoptysis. First noted 8/1 and continued so TNK was eventually stopped. He went to arteriogram 3 times total between 7/31 and 8/2. On 8/2 he began to have significant increase in dyspnea and by the time he returned to the SICU after angiogram required 100% NRB. He received lasix and supportive care. PCCM asked to see 8/3 as symptoms had continued to worsen.    reports that he quit smoking about 26 years ago.   has a past medical history of Diabetes mellitus; Hyperlipidemia; Hypertension; Anemia; COPD (chronic obstructive pulmonary disease); and Peripheral vascular disease.   has past surgical history that includes vein bypass graft,aorto-fem-pop.   LINES/TUBES: PICC 8/3>>   SIGNIFICANT EVENTS: 7/30 admitted for clotted fem-pop bypass graft.   7/31: went to IR for angiogram and TNK.   - 7/31: arteriogram: Pelvic angiography demonstrates atherosclerotic plaque at the origin of the right and left common iliac arteries without narrowing greater than 50%. The left external iliac artery is patent. Successful placement of an infusion catheter into a thrombosed left femoral popliteal artery bypass graft. TNK was instituted at 0.5 milligrams/hour  8/1, 230 p: TNK stopped due to scant hemoptysis. Resumed at 5p. 1/2 dose   - 8/1: arteriogram: improvement with near complete lysed cysts of the saphenous vein graft. There continues to be occlusive thrombus distally. The plan is for an additional 24 hr of lysis with a follow-up check 8/2  - 8/1 630:  hemoptysis returned and was not resumed. Heparin continued.  8/2: increased shortness of breath.   - 8/2 third trip to angiogram: demonstrates resolution of the thrombus throughout the fem-pop venous bypass graft but 2 areas of narrowing distally, at the distal anastomosis, and within the tibioperoneal trunk.   - 8/2: placed on 100% NRB after returning from angiogram.   8/3 PCCM called. Still very hypoxic on 100% NRB. Had episode where sats dropped to 70s. Heparin stopped due to some episode of hemoptysis.   - 8/3 CT chest: 1. Extensive bilateral acute interstitial lung disease superimposed on marked changes of COPD. (pulmonary edema vs viral pneumonitis). Small right pleural effusion. Mild mediastinal adenopathy. Dense coronary artery atheromatous calcifications. There is also diffuse distal esophageal wall thickening. This could be due to incomplete distention, esophagitis or mass. These possibilities could be differentiated with direct endoscopic visualization  8/4 diuresis, cards consulted,  - 8/4 echo> LVEF 30-35%, akinesis of anterior wall, grade 2 diastolic dysfunction, PA pressure , RV mildly reduced function       SUBJECTIVE/OVERNIGHT/INTERVAL HX Not really better at this point      VITAL SIGNS: Temp:  [97.4 F (36.3 C)-98.1 F (36.7 C)] 97.7 F (36.5 C) (08/09 1155) Pulse Rate:  [65-93] 93 (08/09 0800) Resp:  [18-32] 32 (08/09 0800) BP: (92-146)/(32-94) 146/45 mmHg (08/09 0800) SpO2:  [74 %-100 %] 74 % (08/09 0800) FiO2 (%):  [50 %-100 %] 100 % (08/08 1759)  FIO2@  NRM with one flap on  HEMODYNAMICS:   VENTILATOR SETTINGS: Vent Mode:  [-]  FiO2 (%):  [50 %-100 %] 100 %  INTAKE / OUTPUT:  Intake/Output Summary (Last 24 hours) at 03/31/14 1238 Last data filed at 03/31/14 0700  Gross per 24 hour  Intake 909.67 ml  Output   1075 ml  Net -165.33 ml    PHYSICAL EXAMINATION: General:  Elderly white male, sitting up in bed uncomfortable Neuro:  Awake, oriented,  MAEW HEENT: NCAT, EOMs intact, no JVD Cardiovascular: Tachy, regular, no MGR Lungs:  Crackles in bases bilaterally  - no wheeze Abdomen:  Soft, no tenderness to palpation Musculoskeletal:  Intact  Skin:  Intact  Ext: No edema  LABS:  PULMONARY  Recent Labs Lab 03/24/14 1306 03/25/14 0830  PHART 7.427 7.407  PCO2ART 32.8* 36.3  PO2ART 97.0 67.8*  HCO3 21.6 22.1  TCO2 23 23.2  O2SAT 98.0 92.3    CBC  Recent Labs Lab 03/29/14 0500 03/30/14 0420 03/31/14 0255  HGB 9.3* 8.6* 9.3*  HCT 27.9* 24.9* 27.1*  WBC 10.5 16.5* 17.1*  PLT 301 319 273    COAGULATION No results found for this basename: INR,  in the last 168 hours  CARDIAC    Recent Labs Lab 03/25/14 2159 03/26/14 1315 03/26/14 1613 03/26/14 1913 03/30/14 1100  TROPONINI 0.94* 0.49* 0.41* 0.42* 0.34*    Recent Labs Lab 03/25/14 1034 03/26/14 0430 03/29/14 0500 03/30/14 0420  PROBNP 9493.0* 12292.0* 3393.0* 2615.0*     CHEMISTRY  Recent Labs Lab 03/27/14 1200 03/28/14 0500 03/29/14 0500 03/30/14 0420 03/31/14 0255  NA 134* 139 139 135* 132*  K 3.5* 3.1* 4.4 4.2 4.2  CL 92* 97 99 96 91*  CO2 28 32 30 28 30   GLUCOSE 307* 116* 76 225* 178*  BUN 43* 43* 42* 44* 42*  CREATININE 1.50* 1.55* 1.40* 1.31 1.26  CALCIUM 8.0* 7.9* 8.4 8.4 8.4  MG  --   --  2.1 2.1 2.0  PHOS  --   --  3.3 2.8 3.0   Estimated Creatinine Clearance: 39.6 ml/min (by C-G formula based on Cr of 1.26).   LIVER No results found for this basename: AST, ALT, ALKPHOS, BILITOT, PROT, ALBUMIN, INR,  in the last 168 hours   INFECTIOUS  Recent Labs Lab 03/25/14 1035 03/26/14 0430 03/27/14 0500  PROCALCITON 0.50 0.79 0.71     ENDOCRINE CBG (last 3)   Recent Labs  03/30/14 2314 03/31/14 0359 03/31/14 0738  GLUCAP 231* 184* 130*         IMAGING x48h  Dg Chest Port 1 View  03/30/2014   CLINICAL DATA:  80 year old male with shortness of breath  EXAM: PORTABLE CHEST - 1 VIEW  COMPARISON:  03/28/2014  and prior chest radiographs dating back to 03/21/2014  FINDINGS: The cardiomediastinal silhouette is stable.  A right PICC line is again identified with tip overlying upper right atrium.  Diffuse bilateral airspace opacities are again noted and relatively unchanged.  There is no evidence of pneumothorax or definite effusion.  IMPRESSION: Stable chest radiograph with diffuse bilateral airspace opacities.   Electronically Signed   By: Laveda Abbe M.D.   On: 03/30/2014 12:38      ASSESSMENT / PLAN: PULMONOLOGY: A: Baseline June 2015 CT abd done for weight loss - does show undiagnosed ILD findings   - likely UIP/IPF based on age and basal predominance though there is R > L assyemtry  Currently: Acute hypoxemic respiratory failure developed 48h post admission   - DDx : IPF flare following angiography (possible diagnosis, some 5%-10% of IPF diagnosed like this) with Acute systolic CHF/pulm edema, alveolar hemorrhage (  received TNK). Other resp etiologies include Alveolar Hge (had hemoptysis while on lytics), BOOP,  ALI/ARDS. Virual exacerbation     -On 03/29/14: much improved and [facility] to [id number] after IV steroids and daily lasix > worse 03/30/14    P: Await full  autoimmune profile and resp virus profile from 03/28/14 (so far negative) Continue  IV steroids (on them since 03/28/14); trial without benadryl. Likely will need po medrol to go home with  (note he gets hives with prednisone) Continue lasix   Supplemental oxygen, Goal SpO2>92 ABX broad spectrum ( see infectious) Bronchodilators PRN   CARDIOLOGY: A: NSTEMI,  This admit with Acute CHF exac, LVEF 30-35%, anterior akinesis,   P:  Heparin,IV gtt per cards and ASA per cards    INFECTIOUS: A: HCAP? P: Levaquin 8/3>> 8/5 Zosyn 8/3 >> 8/9  Vanco 8/3 >>   8/6  HEMATOLOGY: A: Anemia, acute on chronic disease( baseline 10.3) [facility] And critical [facility]  - No clear gi loss, esophageal lesion on CT - since ? IHD ideally and poor  oxygenation need to keep hgb over 9 ideally - transfused one unit am 8/8 but did not get 1 gm increase so still may be low grade blood loss on hep but not obvious      RENAL: A: AKI > pre-renal? Contrast? Elevated BUN from steroids?     Lab Results  Component Value Date   CREATININE 1.26 03/31/2014   CREATININE 1.31 03/30/2014   CREATININE 1.40* 03/29/2014     P: Monitor cvp Diuresis as tol   GI: A: Distal esophageal wall thickening- ??incomplete distension, esophagitis, mass (Scl-70 is negative this admit)  P: Consulted GI for possible endoscopy after oxygenation improves Hemoccult blood   Advance diet Stool softeners Added high dose ppi 03/30/14    ENDOCRINE: A: Hx DM P: SSI   NEURO A: intact but has some pain nos P Monitor Oxycodone prn  GLOBAL: 03/27/14: Discussed with son / patient >>> they are agreeable to short term ventilatory support if required. Would not be able to tolerate FOB unless intubated - would defer for now  03/28/14: D/w son and daughter. Try empiric steroids.    03/29/14: improved. Updated patient and daughter and d/w Dr Hart RochesterLawson. Move to SDU. PCCM primary. Continue steroids and ACS.CHF Rx. PCCM followup opd needed. Get PT  03/31/14 discussed with pt/ son very limited options at this point          Sandrea HughsMichael Alvy Alsop, MD Pulmonary and Critical Care Medicine [facility] Cell [phone] After 5:30 PM or weekends, call [phone]       03/31/2014 12:38 PM

## 2014-03-31 NOTE — Progress Notes (Signed)
PT Cancellation Note  Patient Details Name: Trevor Morris MRN: [id number] DOB: [birth year]   Cancelled Treatment:    Reason Eval/Treat Not Completed: Medical issues which prohibited therapy, per nsg hold PT at this time secondary to resp status   Fabio Asa 03/31/2014, 1:29 PM Charlotte Crumb, PT DPT  [phone]

## 2014-03-31 NOTE — Progress Notes (Signed)
ANTICOAGULATION CONSULT NOTE - Follow Up Consult  Pharmacy Consult for Heparin Indication: NSTEMI; s/p aortogram and thrombolysis of LLE bypass   Allergies  Allergen Reactions  . Prednisone Hives  . Omnipaque [Iohexol] Itching and Rash    Patient Measurements: Height: 5\' 5"  (165.1 cm) Weight: 127 lb 13.9 oz (58 kg) IBW/kg (Calculated) : 61.5 Heparin Dosing Weight: 58 kg  Vital Signs: Temp: 97.8 F (36.6 C) (08/09 0700) Temp src: Axillary (08/09 0700) BP: 146/45 mmHg (08/09 0800) Pulse Rate: 93 (08/09 0800)  Labs:  Recent Labs  03/29/14 0500  03/30/14 0420 03/30/14 1100 03/30/14 1730 03/31/14 0255  HGB 9.3*  --  8.6*  --   --  9.3*  HCT 27.9*  --  24.9*  --   --  27.1*  PLT 301  --  319  --   --  273  HEPARINUNFRC  --   < > 0.36  --  0.53 0.50  CREATININE 1.40*  --  1.31  --   --  1.26  TROPONINI  --   --   --  0.34*  --   --   < > = values in this interval not displayed.  Estimated Creatinine Clearance: 39.6 ml/min (by C-G formula based on Cr of 1.26).   Assessment: 80 y/o male with acute occlusion of left femoral-below-knee popliteal bypass s/p lysis with TNK. He has had episodes of intermittent hemoptysis and his heparin drip was stopped 8/3. Pharmacy consulted to resume for NSTEMI with no bolus on 8/5. Will target low end of goal to minimize bleeding. Cardiology planning for cardiac cath one pulmonary function improves. Heparin level is 0.5 and at goal, hg/hct and pltc stable.  Goal of Therapy:  Heparin level 0.3-0.5 units/ml with previous hemoptysis Monitor platelets by anticoagulation protocol: Yes   Plan:  -No heparin changes needed -Daily heparin level and CBC  Harland German, Pharm D 03/31/2014 8:23 AM

## 2014-03-31 NOTE — Progress Notes (Signed)
Patient Name: Trevor Morris Date of Encounter: 03/31/2014     Active Problems:   Occlusion of left femoropopliteal bypass graft   Acute respiratory failure with hypoxia   NSTEMI (non-ST elevated myocardial infarction)   COPD (chronic obstructive pulmonary disease)   Acute combined systolic and diastolic heart failure   Hypokalemia    SUBJECTIVE  Mildly net negative yesterday. Sodium and chloride is trending down. H/H is up to 9/27. Continues to worsening respiratory status. Significant O2 requirement. No chest pain.  CURRENT MEDS . sodium chloride   Intravenous Once  . aspirin EC  325 mg Oral Daily  . atorvastatin  80 mg Oral q1800  . carvedilol  3.125 mg Oral BID WC  . feeding supplement (GLUCERNA SHAKE)  237 mL Oral TID BM  . furosemide  40 mg Oral Daily  . insulin aspart  0-15 Units Subcutaneous 6 times per day  . insulin glargine  10 Units Subcutaneous Daily  . lisinopril  20 mg Oral Daily  . methylPREDNISolone (SOLU-MEDROL) injection  40 mg Intravenous Q12H  . multivitamin with minerals  1 tablet Oral Daily  . pantoprazole  40 mg Oral BID AC  . piperacillin-tazobactam (ZOSYN)  IV  3.375 g Intravenous Q8H  . sodium chloride  10-40 mL Intracatheter Q12H  . sodium chloride  3 mL Intravenous Q12H    OBJECTIVE  Filed Vitals:   03/31/14 0500 03/31/14 0600 03/31/14 0700 03/31/14 0800  BP: 130/45 135/57  146/45  Pulse: 65 76  93  Temp:   97.8 F (36.6 C)   TempSrc:   Axillary   Resp: 20 25  32  Height:      Weight:      SpO2: 94% 98%  74%    Intake/Output Summary (Last 24 hours) at 03/31/14 1119 Last data filed at 03/31/14 0700  Gross per 24 hour  Intake 909.67 ml  Output   1075 ml  Net -165.33 ml   Filed Weights   03/28/14 0500 03/29/14 0500 03/29/14 2225  Weight: 126 lb 5.2 oz (57.3 kg) 125 lb 3.5 oz (56.8 kg) 127 lb 13.9 oz (58 kg)    PHYSICAL EXAM  General: Seated upright on NRB mask Neuro: Alert and oriented X 3. Moves all extremities  spontaneously. Psych: Normal affect. HEENT:  Normal  Neck: Supple without bruits or JVD. Lungs:  Respirations mildly labored, mild bibasilar rale, decrease breath sound, ?inspiratory wheezing (too transient) Heart: RRR no s3, s4, or murmurs. Abdomen: Soft, non-tender, non-distended, BS + x 4.  Extremities: No clubbing, cyanosis or edema. DP/PT/Radials 2+ and equal bilaterally.  Accessory Clinical Findings  CBC  Recent Labs  03/30/14 0420 03/31/14 0255  WBC 16.5* 17.1*  HGB 8.6* 9.3*  HCT 24.9* 27.1*  MCV 88.6 86.6  PLT 319 273   Basic Metabolic Panel  Recent Labs  03/30/14 0420 03/31/14 0255  NA 135* 132*  K 4.2 4.2  CL 96 91*  CO2 28 30  GLUCOSE 225* 178*  BUN 44* 42*  CREATININE 1.31 1.26  CALCIUM 8.4 8.4  MG 2.1 2.0  PHOS 2.8 3.0   Cardiac Enzymes  Recent Labs  03/30/14 1100  TROPONINI 0.34*   Fasting Lipid Panel  Recent Labs  03/29/14 0500  CHOL 109  HDL 41  LDLCALC 56  TRIG 60  CHOLHDL 2.7    TELE  NSR with LBBB with HR 60-80s, occasional atrial tach vs SVT with HR 120s, transient. No other significant ventricular ectopy  ECG  NSR  with HR 70s, LBBB, TWI in lateral leads which has been persistent since arrival  Radiology/Studies  Dg Chest 2 View  03/21/2014   CLINICAL DATA:  Preoperative evaluation; hypertension ; emphysema  EXAM: CHEST  2 VIEW  COMPARISON:  None.  FINDINGS: There is underlying emphysema. There is extensive interstitial fibrosis bilaterally. There may be a degree of interstitial edema superimposed. There is no airspace consolidation. The the heart size is normal. The pulmonary vascularity reflects underlying emphysema. No adenopathy. No bone lesions.  IMPRESSION: Emphysema with widespread interstitial fibrosis. There may be some superimposed interstitial edema ; a degree of superimposed congestive heart failure cannot be excluded radiographically. There is no airspace consolidation or adenopathy apparent.   Electronically  Signed   By: Bretta Bang M.D.   On: 03/21/2014 21:35   Ct Chest Wo Contrast  03/25/2014   ADDENDUM REPORT: 03/25/2014 16:49  ADDENDUM: There is also diffuse distal esophageal wall thickening. This could be due to incomplete distention, esophagitis or mass. These possibilities could be differentiated with direct endoscopic visualization.   Electronically Signed   By: Gordan Payment M.D.   On: 03/25/2014 16:49   03/25/2014   CLINICAL DATA:  Evaluate lung infiltrates seen on the portable chest earlier today.  EXAM: CT CHEST WITHOUT CONTRAST  TECHNIQUE: Multidetector CT imaging of the chest was performed following the standard protocol without IV contrast.  COMPARISON:  Portable chest obtained earlier today. Abdomen CT dated 02/19/2014.  FINDINGS: Interval diffuse increase in prominence of the interstitial markings throughout the majority of both lungs, including ground-glass opacities. Extensive bullous changes bilaterally. No masses or focal consolidation. Enlarged precarinal node with a short axis diameter of 19.7 mm on image number 23. Mildly enlarged AP window nodes, the largest with a short axis diameter of 9.3 mm on image number 22.  Small right pleural effusion. Dense coronary artery calcifications. Diffuse low density of the blood. Diffuse distal esophageal wall thickening with a maximum thickness of 10.3 mm on image number 49. Unremarkable upper abdomen. Mild thoracic spine degenerative changes.  IMPRESSION: 1. Extensive bilateral acute interstitial lung disease superimposed on marked changes of COPD. Differential considerations include pulmonary edema and viral pneumonitis. 2. Small right pleural effusion. 3. Mild mediastinal adenopathy, possibly reactive. 4. Dense coronary artery atheromatous calcifications. 5. Anemia.  Electronically Signed: By: Gordan Payment M.D. On: 03/25/2014 16:46   Ir Angiogram Extremity Right  03/22/2014   CLINICAL DATA:  Thrombosed left femoral popliteal arterial saphenous vein  bypass graft.  EXAM: IR INFUSION THROMBOL ARTERIAL INITIAL (MS); RIGHT EXTREMITY ARTERIOGRAPHY; IR ULTRASOUND GUIDANCE VASC ACCESS RIGHT  FLUOROSCOPY TIME:  20 min and 12 seconds.  MEDICATIONS AND MEDICAL HISTORY: Versed two mg, Fentanyl 100 mcg.  Additional Medications: Heparin 5000 units.  ANESTHESIA/SEDATION: Moderate sedation time: 60 minutes  CONTRAST:  150 cc Omnipaque 300  PROCEDURE: The procedure, risks, benefits, and alternatives were explained to the patient. Questions regarding the procedure were encouraged and answered. The patient understands and consents to the procedure.  The right groin was prepped with Betadine in a sterile fashion, and a sterile drape was applied covering the operative field. A sterile gown and sterile gloves were used for the procedure.  Under sonographic guidance, a micropuncture needle was inserted into the right common femoral artery and removed over a 018 wire, which was up sized to a Bentson. A 5 French sheath was inserted. A 4 French omni Flush catheter was advanced over the wire to the aorta. Pelvic angiography was performed.  The  Omni Flush catheter was carefully advanced over a glidewire into the left common iliac artery and finally into the left external iliac artery. The catheter was removed over a Rosen wire. The 5 French sheath was then exchanged for a 35 cm 6 French sheath. This could be advanced to the upper right common iliac artery. It could not be advanced across the bifurcation secondary to significant plaque.  A total of 5000 units of heparin was injected peripherally.  A vertebral 5 French catheter was advanced over the [place] wire to the left external iliac artery and angiography of the left groin and proximal thigh were obtained.  The vertebral catheter was then advanced over a stiff glidewire into the thrombosed venous bypass graft, to the level of the knee. The catheter was removed over a Rosen wire. A 135 cm 50 cm infusion length 5 French multi side hole  catheter was then advanced over the [place] wire to the level of the knee. The catheter and sheath were secured in place. TNK at 0.5 milligrams/hour was instituted into the infusion catheter.  FINDINGS: Pelvic angiography demonstrates atherosclerotic plaque at the origin of the right and left common iliac arteries without narrowing greater than 50%. The left external iliac artery is patent.  The left common femoral artery is patent. The left superficial femoral artery is severely diseased. The left femoral popliteal artery is occluded.  The next series of images demonstrate placement of an infusion catheter into the femoral popliteal artery bypass graft.  COMPLICATIONS: None  IMPRESSION: Successful placement of an infusion catheter into a thrombosed left femoral popliteal artery bypass graft. TNK was instituted at 0.5 milligrams/hour.   Electronically Signed   By: Maryclare Bean M.D.   On: 03/22/2014 14:49   Ir Fem Pop Art Pta Mod Sed  03/24/2014   CLINICAL DATA:  48 hr post left lower extremity fem-pop graft lysis.  EXAM: IR THROMB F/U EVAL ART/VEN FINAL DAY; IR FEM POP ART PTA  FLUOROSCOPY TIME:  17 min and 21 seconds.  MEDICATIONS AND MEDICAL HISTORY: Versed 0.5 mg, Fentanyl 25 mcg.  Additional Medications: Heparin 500 units.  ANESTHESIA/SEDATION: Moderate sedation time: 85 minutes  CONTRAST:  50 cc Visipaque 320  PROCEDURE: The procedure, risks, benefits, and alternatives were explained to the patient. Questions regarding the procedure were encouraged and answered. The patient understands and consents to the procedure.  The right groin was prepped with Betadine in a sterile fashion, and a sterile drape was applied covering the operative field. A sterile gown and sterile gloves were used for the procedure.  Contrast was injected into the multi side-hole catheter and a right lower extremity runoff was performed. This demonstrated resolution of the thrombus with residual narrowing at the distal graft to tibioperoneal  trunk anastomosis as well as a distal tibioperoneal trunk stenosis. The heparin was stopped during the procedure. After approximately 20 min, repeat angiogram demonstrates recurrent early occlusion at these areas of narrowing. 500 units of additional heparin was administered.  The infusion catheter was exchanged over a 300 cm stiff a 018 wire for a 4 mm x 2 cm balloon.  A glidewire was advanced through the balloon and across the 2 lesions into the peroneal artery. The glidewire was exchanged for the stiff 018 wire. The 2 lesions were dilated.  Post angioplasty imaging with angiography demonstrates a dissection in the tibioperoneal trunk narrowing location. This area was read dilated and held in place for 60 seconds. Repeat angiogram demonstrates wide patency at the 2 areas  of stenosis.  A full left lower extremity runoff demonstrates patency of the left common femoral artery, bypass graft, tibioperoneal trunk, and with 2 vessel runoff via the posterior tibial and peroneal arteries.  The sheath was removed and hemostasis was achieved with an exo seal device.  FINDINGS: Initial angiogram demonstrates resolution of the thrombus throughout the fem-pop venous bypass graft but 2 areas of narrowing distally, at the distal anastomosis, and within the tibioperoneal trunk.  After angioplasty, initial repeat imaging demonstrates a dissection at the tibioperoneal trunk stenosis.  After repeat angioplasty, the vessel was noted to be widely patent with some irregularity.  Left lower extremity runoff was performed demonstrating patency of the graft, distal anastomosis to the tibioperoneal trunk, and 2 vessel runoff via the posterior tibial artery and peroneal artery.  COMPLICATIONS: None  IMPRESSION: Successful lysis of a femoral popliteal bypass graft and successful angioplasty of 2 areas of infrapopliteal narrowing as described.   Electronically Signed   By: Maryclare Bean M.D.   On: 03/24/2014 12:07   Ir US Guide Vasc Access  Right  03/22/2014   CLINICAL DATA:  Thrombosed left femoral popliteal arterial saphenous vein bypass graft.  EXAM: IR INFUSION THROMBOL ARTERIAL INITIAL (MS); RIGHT EXTREMITY ARTERIOGRAPHY; IR ULTRASOUND GUIDANCE VASC ACCESS RIGHT  FLUOROSCOPY TIME:  20 min and 12 seconds.  MEDICATIONS AND MEDICAL HISTORY: Versed two mg, Fentanyl 100 mcg.  Additional Medications: Heparin 5000 units.  ANESTHESIA/SEDATION: Moderate sedation time: 60 minutes  CONTRAST:  150 cc Omnipaque 300  PROCEDURE: The procedure, risks, benefits, and alternatives were explained to the patient. Questions regarding the procedure were encouraged and answered. The patient understands and consents to the procedure.  The right groin was prepped with Betadine in a sterile fashion, and a sterile drape was applied covering the operative field. A sterile gown and sterile gloves were used for the procedure.  Under sonographic guidance, a micropuncture needle was inserted into the right common femoral artery and removed over a 018 wire, which was up sized to a Bentson. A 5 French sheath was inserted. A 4 French omni Flush catheter was advanced over the wire to the aorta. Pelvic angiography was performed.  The Omni Flush catheter was carefully advanced over a glidewire into the left common iliac artery and finally into the left external iliac artery. The catheter was removed over a Rosen wire. The 5 French sheath was then exchanged for a 35 cm 6 French sheath. This could be advanced to the upper right common iliac artery. It could not be advanced across the bifurcation secondary to significant plaque.  A total of 5000 units of heparin was injected peripherally.  A vertebral 5 French catheter was advanced over the [place] wire to the left external iliac artery and angiography of the left groin and proximal thigh were obtained.  The vertebral catheter was then advanced over a stiff glidewire into the thrombosed venous bypass graft, to the level of the knee. The  catheter was removed over a Rosen wire. A 135 cm 50 cm infusion length 5 French multi side hole catheter was then advanced over the [place] wire to the level of the knee. The catheter and sheath were secured in place. TNK at 0.5 milligrams/hour was instituted into the infusion catheter.  FINDINGS: Pelvic angiography demonstrates atherosclerotic plaque at the origin of the right and left common iliac arteries without narrowing greater than 50%. The left external iliac artery is patent.  The left common femoral artery is patent. The left superficial femoral  artery is severely diseased. The left femoral popliteal artery is occluded.  The next series of images demonstrate placement of an infusion catheter into the femoral popliteal artery bypass graft.  COMPLICATIONS: None  IMPRESSION: Successful placement of an infusion catheter into a thrombosed left femoral popliteal artery bypass graft. TNK was instituted at 0.5 milligrams/hour.   Electronically Signed   By: Maryclare Bean M.D.   On: 03/22/2014 14:49   Dg Chest Port 1 View  03/28/2014   CLINICAL DATA:  Check endotracheal tube placement  EXAM: PORTABLE CHEST - 1 VIEW  COMPARISON:  03/27/2014  FINDINGS: A right-sided PICC line is again noted and stable. No endotracheal tube is identified at this time. Diffuse increased density is again noted in the right mid and upper lung as well as the left lower lung. The overall appearance is stable from the prior exam.  IMPRESSION: No significant change from the prior day. No endotracheal tube is noted.   Electronically Signed   By: Alcide Clever M.D.   On: 03/28/2014 07:49   Dg Chest Port 1 View  03/27/2014   CLINICAL DATA:  Lung infiltrates  EXAM: PORTABLE CHEST - 1 VIEW  COMPARISON:  Chest radiograph and chest CT March 25, 2014  FINDINGS: There is underlying emphysematous change. There is a degree of underlying interstitial fibrosis. Widespread interstitial and alveolar edema remain without appreciable change. Heart is mildly  enlarged. The pulmonary vascularity is within normal limits. Central catheter tip is in the right atrium slightly distal to the cavoatrial junction, stable. No pneumothorax.  IMPRESSION: Suspect congestive heart failure superimposed on chronic interstitial fibrosis and emphysema. There may well be underlying infectious pneumonia as well. The appearance is stable compared to 2 days prior.   Electronically Signed   By: Bretta Bang M.D.   On: 03/27/2014 07:19   Dg Chest Port 1 View  03/25/2014   CLINICAL DATA:  Shortness of breath  EXAM: PORTABLE CHEST - 1 VIEW  COMPARISON:  03/24/2014  FINDINGS: Cardiac shadow is within normal limits. Diffuse bilateral infiltrates are again identified. A PICC line is now been placed on the right with the catheter tip at the cavoatrial junction. No pneumothorax is seen. No other focal abnormality is noted.  IMPRESSION: New PICC line in satisfactory position.  Bilateral parenchymal infiltrates stable from the previous day.   Electronically Signed   By: Alcide Clever M.D.   On: 03/25/2014 09:12   Dg Chest Port 1 View  03/24/2014   CLINICAL DATA:  Short of breath, coughing congestion  EXAM: PORTABLE CHEST - 1 VIEW  COMPARISON:  Prior chest x-ray 03/24/2014 at 3:28 a.m.  FINDINGS: Persistent diffuse bilateral predominantly interstitial opacities. There has been no significant interval change compared to the radiograph from earlier this morning. Cardiac and mediastinal contours remain unchanged. Atherosclerotic calcifications are present within the transverse aorta. Upper abdominal gas pattern is unremarkable. No acute osseous abnormality.  IMPRESSION: No significant interval change in the appearance of the lungs. Persistent diffuse bilateral interstitial opacities which may reflect pulmonary fibrosis, interstitial edema or atypical infection.   Electronically Signed   By: Malachy Moan M.D.   On: 03/24/2014 14:40   Dg Chest Port 1 View  03/24/2014   CLINICAL DATA:   Hemoptysis and wheezing.  EXAM: PORTABLE CHEST - 1 VIEW  COMPARISON:  03/21/2014  FINDINGS: Borderline heart size. Diffuse coarse interstitial infiltrates throughout both lungs likely representing diffuse interstitial fibrosis. No significant change since previous study, allowing for technical differences. No focal  consolidation. No blunting of costophrenic angles. No pneumothorax.  IMPRESSION: Diffuse interstitial infiltrates in the lungs likely fibrosis. No change since prior study.   Electronically Signed   By: Burman Nieves M.D.   On: 03/24/2014 04:05   Ir Infusion Thrombol Arterial Initial (ms)  03/22/2014   CLINICAL DATA:  Thrombosed left femoral popliteal arterial saphenous vein bypass graft.  EXAM: IR INFUSION THROMBOL ARTERIAL INITIAL (MS); RIGHT EXTREMITY ARTERIOGRAPHY; IR ULTRASOUND GUIDANCE VASC ACCESS RIGHT  FLUOROSCOPY TIME:  20 min and 12 seconds.  MEDICATIONS AND MEDICAL HISTORY: Versed two mg, Fentanyl 100 mcg.  Additional Medications: Heparin 5000 units.  ANESTHESIA/SEDATION: Moderate sedation time: 60 minutes  CONTRAST:  150 cc Omnipaque 300  PROCEDURE: The procedure, risks, benefits, and alternatives were explained to the patient. Questions regarding the procedure were encouraged and answered. The patient understands and consents to the procedure.  The right groin was prepped with Betadine in a sterile fashion, and a sterile drape was applied covering the operative field. A sterile gown and sterile gloves were used for the procedure.  Under sonographic guidance, a micropuncture needle was inserted into the right common femoral artery and removed over a 018 wire, which was up sized to a Bentson. A 5 French sheath was inserted. A 4 French omni Flush catheter was advanced over the wire to the aorta. Pelvic angiography was performed.  The Omni Flush catheter was carefully advanced over a glidewire into the left common iliac artery and finally into the left external iliac artery. The catheter  was removed over a Rosen wire. The 5 French sheath was then exchanged for a 35 cm 6 French sheath. This could be advanced to the upper right common iliac artery. It could not be advanced across the bifurcation secondary to significant plaque.  A total of 5000 units of heparin was injected peripherally.  A vertebral 5 French catheter was advanced over the [place] wire to the left external iliac artery and angiography of the left groin and proximal thigh were obtained.  The vertebral catheter was then advanced over a stiff glidewire into the thrombosed venous bypass graft, to the level of the knee. The catheter was removed over a Rosen wire. A 135 cm 50 cm infusion length 5 French multi side hole catheter was then advanced over the [place] wire to the level of the knee. The catheter and sheath were secured in place. TNK at 0.5 milligrams/hour was instituted into the infusion catheter.  FINDINGS: Pelvic angiography demonstrates atherosclerotic plaque at the origin of the right and left common iliac arteries without narrowing greater than 50%. The left external iliac artery is patent.  The left common femoral artery is patent. The left superficial femoral artery is severely diseased. The left femoral popliteal artery is occluded.  The next series of images demonstrate placement of an infusion catheter into the femoral popliteal artery bypass graft.  COMPLICATIONS: None  IMPRESSION: Successful placement of an infusion catheter into a thrombosed left femoral popliteal artery bypass graft. TNK was instituted at 0.5 milligrams/hour.   Electronically Signed   By: Maryclare Bean M.D.   On: 03/22/2014 14:49   Ir Rande Lawman F/u Eval Art/ven Basil Dess Day (ms)  03/23/2014   CLINICAL DATA:  24 hr post left lower extremity arterial lysis check  EXAM: IR THROMB F/U EVAL ART/VEN SUBSEQ DAY (MS)  FLUOROSCOPY TIME:  30 seconds.  MEDICATIONS AND MEDICAL HISTORY: None  ANESTHESIA/SEDATION: None  CONTRAST:  20 cc Omnipaque 350  PROCEDURE: The  procedure, risks, benefits, and  alternatives were explained to the patient. Questions regarding the procedure were encouraged and answered. The patient understands and consents to the procedure.  Contrast was injected into the infusion catheter and imaging was obtained.  FINDINGS: The left common femoral artery remains patent.  The saphenous vein graft is patent to the level of the knee. It is occluded at the knee. The infusion catheter remains in place. Runoff tibial vessels were not seen to opacified.  COMPLICATIONS: None  IMPRESSION: There is improvement with near complete lysed cysts of the saphenous vein graft. There continues to be occlusive thrombus distally. The plan is for an additional 24 hr of lysis with a follow-up check tomorrow morning.   Electronically Signed   By: Maryclare Bean M.D.   On: 03/23/2014 09:12   Ir Rande Lawman F/u Eval Art/ven Final Day (ms)  03/24/2014   CLINICAL DATA:  48 hr post left lower extremity fem-pop graft lysis.  EXAM: IR THROMB F/U EVAL ART/VEN FINAL DAY; IR FEM POP ART PTA  FLUOROSCOPY TIME:  17 min and 21 seconds.  MEDICATIONS AND MEDICAL HISTORY: Versed 0.5 mg, Fentanyl 25 mcg.  Additional Medications: Heparin 500 units.  ANESTHESIA/SEDATION: Moderate sedation time: 85 minutes  CONTRAST:  50 cc Visipaque 320  PROCEDURE: The procedure, risks, benefits, and alternatives were explained to the patient. Questions regarding the procedure were encouraged and answered. The patient understands and consents to the procedure.  The right groin was prepped with Betadine in a sterile fashion, and a sterile drape was applied covering the operative field. A sterile gown and sterile gloves were used for the procedure.  Contrast was injected into the multi side-hole catheter and a right lower extremity runoff was performed. This demonstrated resolution of the thrombus with residual narrowing at the distal graft to tibioperoneal trunk anastomosis as well as a distal tibioperoneal trunk stenosis. The  heparin was stopped during the procedure. After approximately 20 min, repeat angiogram demonstrates recurrent early occlusion at these areas of narrowing. 500 units of additional heparin was administered.  The infusion catheter was exchanged over a 300 cm stiff a 018 wire for a 4 mm x 2 cm balloon.  A glidewire was advanced through the balloon and across the 2 lesions into the peroneal artery. The glidewire was exchanged for the stiff 018 wire. The 2 lesions were dilated.  Post angioplasty imaging with angiography demonstrates a dissection in the tibioperoneal trunk narrowing location. This area was read dilated and held in place for 60 seconds. Repeat angiogram demonstrates wide patency at the 2 areas of stenosis.  A full left lower extremity runoff demonstrates patency of the left common femoral artery, bypass graft, tibioperoneal trunk, and with 2 vessel runoff via the posterior tibial and peroneal arteries.  The sheath was removed and hemostasis was achieved with an exo seal device.  FINDINGS: Initial angiogram demonstrates resolution of the thrombus throughout the fem-pop venous bypass graft but 2 areas of narrowing distally, at the distal anastomosis, and within the tibioperoneal trunk.  After angioplasty, initial repeat imaging demonstrates a dissection at the tibioperoneal trunk stenosis.  After repeat angioplasty, the vessel was noted to be widely patent with some irregularity.  Left lower extremity runoff was performed demonstrating patency of the graft, distal anastomosis to the tibioperoneal trunk, and 2 vessel runoff via the posterior tibial artery and peroneal artery.  COMPLICATIONS: None  IMPRESSION: Successful lysis of a femoral popliteal bypass graft and successful angioplasty of 2 areas of infrapopliteal narrowing as described.   Electronically Signed  By: Maryclare Bean M.D.   On: 03/24/2014 12:07    ASSESSMENT AND PLAN  1. Acute respiratory failure  - h/o contrast allergy s/p angiography and  TNK for lysis of distal clot of LE bypass graft 7/31. Started to have   significant dyspnea on 8/2,   - per [facility] IPF flare following angiography  2. NSTEMI with anterior akinesis on echo  - in the setting of severe anemia, trop 0.49 --> 0.41 --> 0.42  - plan for possible [facility] cath next week once pulm function improves  - will need pretreatment prior to cath for allergy to omnipaque given recent IPF flare after angiography, does   have h/o allergy of prednisone, however tolerating benadryl with solu-medrol. If plan for cath, will need   solumedrol, benadryl and pepcid on board.  3. Acute systolic heart failure with EF 30-35%  - ?underlying ischemia vs takotsubo's, given LBBB, catheterization is recommended, but not safe to do that   from a respiratory standpoint.  - Sodium and chloride decreased today. May need to watch lasix to make sure he is not overdiursed.  4. Anemia - H/H improved with blood yesterday. No significant change in O2 requirement. 5. Acute renal insufficiency - creatinine is slowly improving 6. LBBB  Plan: Would work toward right and left heart catheterization next week given cardiomyopathy, persistent dyspnea and hypoxia, as well as LBBB and small NSTEMI, which is likely Type II Mediated. As mentioned, he will need contrast dye prophylaxis.  Follow sodium and chloride closely, ?if we need to hold diuretics.   Chrystie Nose, MD, [facility] Attending Cardiologist [facility]

## 2014-03-31 NOTE — Progress Notes (Signed)
Patient ID: Trevor Morris, male   DOB: 21-Nov-1933, 80 y.o.   MRN: [id number] Patient continues to have functioning left femoral popliteal bypass with 3+ popliteal and posterior tibial pulse palpable Continues to have cardiac and pulmonary issues  We will followup in office in 4 weeks with duplex scan of bypass and ABIs

## 2014-04-01 LAB — BASIC METABOLIC PANEL
ANION GAP: 9 (ref 5–15)
BUN: 38 mg/dL — ABNORMAL HIGH (ref 6–23)
CHLORIDE: 95 meq/L — AB (ref 96–112)
CO2: 34 meq/L — AB (ref 19–32)
Calcium: 8.7 mg/dL (ref 8.4–10.5)
Creatinine, Ser: 1.11 mg/dL (ref 0.50–1.35)
GFR calc Af Amer: 71 mL/min — ABNORMAL LOW (ref >90)
GFR calc non Af Amer: 62 mL/min — ABNORMAL LOW (ref >90)
Glucose, Bld: 123 mg/dL — ABNORMAL HIGH (ref 70–99)
Potassium: 4.2 mEq/L (ref 3.7–5.3)
SODIUM: 138 meq/L (ref 137–147)

## 2014-04-01 LAB — GLUCOSE, CAPILLARY
GLUCOSE-CAPILLARY: 316 mg/dL — AB (ref 70–99)
GLUCOSE-CAPILLARY: 229 mg/dL — AB (ref 70–99)
Glucose-Capillary: 127 mg/dL — ABNORMAL HIGH (ref 70–99)
Glucose-Capillary: 120 mg/dL — ABNORMAL HIGH (ref 70–99)
Glucose-Capillary: 190 mg/dL — ABNORMAL HIGH (ref 70–99)
Glucose-Capillary: 232 mg/dL — ABNORMAL HIGH (ref 70–99)

## 2014-04-01 LAB — CBC
HCT: 26.6 % — ABNORMAL LOW (ref 39.0–52.0)
Hemoglobin: 9.1 g/dL — ABNORMAL LOW (ref 13.0–17.0)
MCH: 29.5 pg (ref 26.0–34.0)
MCHC: 34.2 g/dL (ref 30.0–36.0)
MCV: 86.4 fL (ref 78.0–100.0)
PLATELETS: 295 10*3/uL (ref 150–400)
RBC: 3.08 MIL/uL — AB (ref 4.22–5.81)
RDW: 15.2 % (ref 11.5–15.5)
WBC: 17.2 10*3/uL — ABNORMAL HIGH (ref 4.0–10.5)

## 2014-04-01 LAB — MAGNESIUM: Magnesium: 2 mg/dL (ref 1.5–2.5)

## 2014-04-01 LAB — PHOSPHORUS: PHOSPHORUS: 3.1 mg/dL (ref 2.3–4.6)

## 2014-04-01 LAB — CYCLIC CITRUL PEPTIDE ANTIBODY, IGG: Cyclic Citrullin Peptide Ab: <2 U/mL (ref 0.0–5.0)

## 2014-04-01 LAB — SJOGRENS SYNDROME-B EXTRACTABLE NUCLEAR ANTIBODY: SSB (La) (ENA) Antibody, IgG: <1

## 2014-04-01 LAB — SJOGRENS SYNDROME-A EXTRACTABLE NUCLEAR ANTIBODY: SSA (Ro) (ENA) Antibody, IgG: <1

## 2014-04-01 LAB — HEPARIN LEVEL (UNFRACTIONATED): HEPARIN UNFRACTIONATED: 0.59 [IU]/mL (ref 0.30–0.70)

## 2014-04-01 NOTE — Progress Notes (Signed)
PULMONARY / CRITICAL CARE MEDICINE  Name: Trevor Morris MRN: [id number] DOB: 10/12/1933    ADMISSION DATE:  03/21/2014 CONSULTATION DATE: Lillia Mountain8/3  REFERRING MD :  Darrick PennaFields   CHIEF COMPLAINT:  Acute hypoxic respiratory failure   INITIAL PRESENTATION:  80 year old male w/ known h/o CM allergy. Underwent angiography and TNK administration for lysis of distal clot at his by-pass graft site on 7/31. His post-op course was complicated by episodes of scant hemoptysis. First noted 8/1 and continued so TNK was eventually stopped. He went to arteriogram 3 times total between 7/31 and 8/2. On 8/2 he began to have significant increase in dyspnea and by the time he returned to the SICU after angiogram required 100% NRB. He received lasix and supportive care. PCCM asked to see 8/3 as symptoms had continued to worsen.    reports that he quit smoking about 26 years ago.    LINES/TUBES: PICC 8/3>>   SIGNIFICANT EVENTS: 7/30 admitted for clotted fem-pop bypass graft.   7/31: went to IR for angiogram and TNK.   - 7/31: arteriogram: Pelvic angiography demonstrates atherosclerotic plaque at the origin of the right and left common iliac arteries without narrowing greater than 50%. The left external iliac artery is patent. Successful placement of an infusion catheter into a thrombosed left femoral popliteal artery bypass graft. TNK was instituted at 0.5 milligrams/hour  8/1, 230 p: TNK stopped due to scant hemoptysis. Resumed at 5p. 1/2 dose   - 8/1: arteriogram: improvement with near complete lysed cysts of the saphenous vein graft. There continues to be occlusive thrombus distally. The plan is for an additional 24 hr of lysis with a follow-up check 8/2  - 8/1 630: hemoptysis returned and was not resumed. Heparin continued.  8/2: increased shortness of breath.   - 8/2 third trip to angiogram: demonstrates resolution of the thrombus throughout the fem-pop venous bypass graft but 2 areas of narrowing distally,  at the distal anastomosis, and within the tibioperoneal trunk.   - 8/2: placed on 100% NRB after returning from angiogram.   8/3 PCCM called. Still very hypoxic on 100% NRB. Had episode where sats dropped to 70s. Heparin stopped due to some episode of hemoptysis.   - 8/3 CT chest: 1. Extensive bilateral acute interstitial lung disease superimposed on marked changes of COPD. (pulmonary edema vs viral pneumonitis). Small right pleural effusion. Mild mediastinal adenopathy. Dense coronary artery atheromatous calcifications. There is also diffuse distal esophageal wall thickening. This could be due to incomplete distention, esophagitis or mass. These possibilities could be differentiated with direct endoscopic visualization  8/4 diuresis, cards consulted,  - 8/4 echo> LVEF 30-35%, akinesis of anterior wall, grade 2 diastolic dysfunction, PA pressure 42mmHg, RV mildly reduced function    8/10 remains on NRB with 70% fio2   SUBJECTIVE/OVERNIGHT/INTERVAL HX Not really better at this point , still requires high flow O2 and desaturates with any activity.     VITAL SIGNS: Temp:  [97.7 F (36.5 C)-99 F (37.2 C)] 97.7 F (36.5 C) (08/10 0733) Pulse Rate:  [78-98] 90 (08/10 0800) Resp:  [19-35] 19 (08/10 0800) BP: (129-157)/(46-58) 152/58 mmHg (08/10 0800) SpO2:  [87 %-98 %] 87 % (08/10 0800)  FIO2@  NRM with no flap on  HEMODYNAMICS: CVP:  [4 mmHg-10 mmHg] 4 mmHg VENTILATOR SETTINGS:   INTAKE / OUTPUT:  Intake/Output Summary (Last 24 hours) at 04/01/14 1024 Last data filed at 04/01/14 0800  Gross per 24 hour  Intake     17 ml  Output    950 ml  Net   -933 ml    PHYSICAL EXAMINATION: General:  Elderly white male, sitting up in bed uncomfortable Neuro:  Awake, oriented, MAEW HEENT: NCAT, EOMs intact, no JVD Cardiovascular: Tachy, regular, no MGR Lungs:  Diminished bs bases Abdomen:  Soft, no tenderness to palpation Musculoskeletal:  Intact  Skin:  Intact  Ext: No  edema  LABS:  PULMONARY No results found for this basename: PHART, PCO2, PCO2ART, PO2, PO2ART, HCO3, TCO2, O2SAT,  in the last 168 hours  CBC  Recent Labs Lab 03/30/14 0420 03/31/14 0255 04/01/14 0725  HGB 8.6* 9.3* 9.1*  HCT 24.9* 27.1* 26.6*  WBC 16.5* 17.1* 17.2*  PLT 319 273 295    COAGULATION No results found for this basename: INR,  in the last 168 hours  CARDIAC    Recent Labs Lab 03/25/14 2159 03/26/14 1315 03/26/14 1613 03/26/14 1913 03/30/14 1100  TROPONINI 0.94* 0.49* 0.41* 0.42* 0.34*    Recent Labs Lab 03/25/14 1034 03/26/14 0430 03/29/14 0500 03/30/14 0420  PROBNP 9493.0* 12292.0* 3393.0* 2615.0*     CHEMISTRY  Recent Labs Lab 03/28/14 0500 03/29/14 0500 03/30/14 0420 03/31/14 0255 04/01/14 0725  NA 139 139 135* 132* 138  K 3.1* 4.4 4.2 4.2 4.2  CL 97 99 96 91* 95*  CO2 32 30 28 30  34*  GLUCOSE 116* 76 225* 178* 123*  BUN 43* 42* 44* 42* 38*  CREATININE 1.55* 1.40* 1.31 1.26 1.11  CALCIUM 7.9* 8.4 8.4 8.4 8.7  MG  --  2.1 2.1 2.0 2.0  PHOS  --  3.3 2.8 3.0 3.1   Estimated Creatinine Clearance: 45 ml/min (by C-G formula based on Cr of 1.11).   LIVER No results found for this basename: AST, ALT, ALKPHOS, BILITOT, PROT, ALBUMIN, INR,  in the last 168 hours   INFECTIOUS  Recent Labs Lab 03/25/14 1035 03/26/14 0430 03/27/14 0500  PROCALCITON 0.50 0.79 0.71     ENDOCRINE CBG (last 3)   Recent Labs  03/31/14 2306 04/01/14 0446 04/01/14 0726  GLUCAP 102* 127* 120*         IMAGING x48h  Dg Chest Port 1 View  03/30/2014   CLINICAL DATA:  80 year old male with shortness of breath  EXAM: PORTABLE CHEST - 1 VIEW  COMPARISON:  03/28/2014 and prior chest radiographs dating back to 03/21/2014  FINDINGS: The cardiomediastinal silhouette is stable.  A right PICC line is again identified with tip overlying upper right atrium.  Diffuse bilateral airspace opacities are again noted and relatively unchanged.  There is no  evidence of pneumothorax or definite effusion.  IMPRESSION: Stable chest radiograph with diffuse bilateral airspace opacities.   Electronically Signed   By: Laveda Abbe M.D.   On: 03/30/2014 12:38      ASSESSMENT / PLAN: PULMONOLOGY: A: Baseline June 2015 CT abd done for weight loss - does show undiagnosed ILD findings   - likely UIP/IPF based on age and basal predominance though there is R > L assyemtry  Currently: Acute hypoxemic respiratory failure developed 48h post admission   - DDx : IPF flare following angiography (possible diagnosis, some 5%-10% of IPF diagnosed like this) with Acute systolic CHF/pulm edema, alveolar hemorrhage (received TNK). Other resp etiologies include Alveolar Hge (had hemoptysis while on lytics), BOOP,  ALI/ARDS. Virual exacerbation     -On 03/29/14: much improved and down to [facility] after IV steroids and daily lasix > worse 03/30/14               -  On 8-10 on NRB 70% with desaturation with any stress. P: Await full  autoimmune profile and resp virus profile from 03/28/14 (so far negative) Continue  IV steroids (on them since 03/28/14); trial without benadryl. Likely will need po medrol to go home with  (note he gets hives with prednisone) Continue lasix   Supplemental oxygen, Goal SpO2>92 ABX broad spectrum ( see infectious) Bronchodilators PRN   CARDIOLOGY: A: NSTEMI,  This admit with Acute CHF exac, LVEF 30-35%, anterior akinesis,   P:  Heparin,IV gtt per cards and ASA per cards   INFECTIOUS: A: HCAP? P: Levaquin 8/3>> 8/5 Zosyn 8/3 >> 8/9  Vanco 8/3 >>   8/6  HEMATOLOGY: A: Anemia, acute on chronic disease( baseline 10.3) > due to [facility]? And critical illness  - No clear gi loss, esophageal lesion on CT - since ? IHD ideally and poor oxygenation need to keep hgb over 9 ideally - transfused one unit am 8/8 but did not get 1 gm increase so still may be low grade blood loss on hep but not obvious   RENAL: A: AKI > pre-renal? Contrast? Elevated BUN from  steroids?     Lab Results  Component Value Date   CREATININE 1.11 04/01/2014   CREATININE 1.26 03/31/2014   CREATININE 1.31 03/30/2014     P: Monitor cvp Diuresis as tol   GI: A: Distal esophageal wall thickening- ??incomplete distension, esophagitis, mass (Scl-70 is negative this admit)  P: Consulted GI for possible endoscopy after oxygenation improves Hemoccult blood   Advance diet Stool softeners Added high dose ppi 03/30/14   ENDOCRINE: A: Hx DM P: SSI   NEURO A: intact but has some pain nos P Monitor Oxycodone prn  GLOBAL: 03/27/14: Discussed with son / patient >>> they are agreeable to short term ventilatory support if required. Would not be able to tolerate FOB unless intubated - would defer for now  03/28/14: D/w son and daughter. Try empiric steroids.    03/29/14: improved. Updated patient and daughter and d/w Dr Hart [place]. Move to SDU. PCCM primary. Continue steroids and ACS.CHF Rx. PCCM followup opd needed. Get PT  03/31/14 discussed with pt/ son very limited options at this point   8/10 for possible LHC per cards, doubt currently he could tolerate cath without intubation.  Brett Canales Minor ACNP [facility] PCCM Pager [phone] till 3 pm If no answer page [phone] 04/01/2014, 10:30 AM  Patient with evidence of fibrosis, I am concerned that ultimately he will need an open lung biopsy.  As of now respiratory viral panel and full auto-immune work up remain pending.  If all negative then will likely recommend an open-lung.  Patient seen and examined, agree with above note.  I dictated the care and orders written for this patient under my direction.  Alyson Reedy, MD [phone]   04/01/2014 10:24 AM

## 2014-04-01 NOTE — Progress Notes (Signed)
ANTICOAGULATION CONSULT NOTE - Follow Up Consult  Pharmacy Consult for Heparin Indication: NSTEMI; s/p aortogram and thrombolysis of LLE bypass   Allergies  Allergen Reactions  . Prednisone Hives  . Omnipaque [Iohexol] Itching and Rash    Patient Measurements: Height: 5\' 5"  (165.1 cm) Weight: 127 lb 13.9 oz (58 kg) IBW/kg (Calculated) : 61.5 Heparin Dosing Weight: 58 kg  Vital Signs: Temp: 98 F (36.7 C) (08/10 1140) Temp src: Oral (08/10 1140) BP: 140/44 mmHg (08/10 1100) Pulse Rate: 85 (08/10 1100)  Labs:  Recent Labs  03/30/14 0420 03/30/14 1100 03/30/14 1730 03/31/14 0255 04/01/14 0725  HGB 8.6*  --   --  9.3* 9.1*  HCT 24.9*  --   --  27.1* 26.6*  PLT 319  --   --  273 295  HEPARINUNFRC 0.36  --  0.53 0.50 0.59  CREATININE 1.31  --   --  1.26 1.11  TROPONINI  --  0.34*  --   --   --     Estimated Creatinine Clearance: 45 ml/min (by C-G formula based on Cr of 1.11).   Assessment: 80 y/o male with acute occlusion of left femoral-below-knee popliteal bypass s/p lysis with TNKase (7/31-8/2). He had episodes of intermittent hemoptysis during and after lytic therapy; heparin drip was stopped on 8/3. Pharmacy consulted on 8/5 to resume for NSTEMI with no bolus.  Targeting low end of therapeutic range (0.3-0.5) to try minimize/avoid bleeding. Cardiology planning for cardiac cath one pulmonary function improves. Heparin level has trended up to 0.59 on 850 units/hr. Therapeutic, but above low goal. RN reports no hemoptysis.  Goal of Therapy:  Heparin level 0.3-0.5 units/ml with previous hemoptysis Monitor platelets by anticoagulation protocol: Yes   Plan:   Decrease heparin drip from 850 to 800 units/hr.  Continue daily heparin level and CBC while on heparin.  Nicolette Bang, RPh Pager: [phone]  04/01/2014 12:47 PM

## 2014-04-01 NOTE — Progress Notes (Signed)
Patient Name: Trevor Morris Date of Encounter: 04/01/2014     Active Problems:   Occlusion of left femoropopliteal bypass graft   Acute respiratory failure with hypoxia   NSTEMI (non-ST elevated myocardial infarction)   COPD (chronic obstructive pulmonary disease)   Acute combined systolic and diastolic heart failure   Hypokalemia    SUBJECTIVE  Worsening SOB, feels like he's getting weaker. No CP  CURRENT MEDS . sodium chloride   Intravenous Once  . aspirin EC  325 mg Oral Daily  . atorvastatin  80 mg Oral q1800  . carvedilol  3.125 mg Oral BID WC  . feeding supplement (GLUCERNA SHAKE)  237 mL Oral TID BM  . furosemide  40 mg Oral Daily  . insulin aspart  0-15 Units Subcutaneous 6 times per day  . insulin glargine  10 Units Subcutaneous Daily  . lisinopril  20 mg Oral Daily  . methylPREDNISolone (SOLU-MEDROL) injection  40 mg Intravenous Q12H  . multivitamin with minerals  1 tablet Oral Daily  . pantoprazole  40 mg Oral BID AC  . sodium chloride  10-40 mL Intracatheter Q12H  . sodium chloride  3 mL Intravenous Q12H    OBJECTIVE  Filed Vitals:   04/01/14 0450 04/01/14 0733 04/01/14 0800 04/01/14 1140  BP: 139/55  152/58   Pulse: 87  90   Temp: 98 F (36.7 C) 97.7 F (36.5 C)  98 F (36.7 C)  TempSrc: Oral Oral  Oral  Resp: 20  19   Height:      Weight:      SpO2: 90%  87%     Intake/Output Summary (Last 24 hours) at 04/01/14 1159 Last data filed at 04/01/14 0800  Gross per 24 hour  Intake     17 ml  Output    950 ml  Net   -933 ml   Filed Weights   03/28/14 0500 03/29/14 0500 03/29/14 2225  Weight: 126 lb 5.2 oz (57.3 kg) 125 lb 3.5 oz (56.8 kg) 127 lb 13.9 oz (58 kg)    PHYSICAL EXAM  General: Pleasant, NAD. Neuro: Alert and oriented X 3. Moves all extremities spontaneously. Psych: Normal affect. HEENT:  Normal  Neck: Supple without bruits or JVD. Lungs:  Resp regular and unlabored, bibasilar rale, decreased breath sound overall Heart:  RRR no s3, s4, or murmurs. Abdomen: Soft, non-tender, non-distended, BS + x 4.  Extremities: No clubbing, cyanosis or edema. DP/PT/Radials 2+ and equal bilaterally.  Accessory Clinical Findings  CBC  Recent Labs  03/31/14 0255 04/01/14 0725  WBC 17.1* 17.2*  HGB 9.3* 9.1*  HCT 27.1* 26.6*  MCV 86.6 86.4  PLT 273 295   Basic Metabolic Panel  Recent Labs  03/31/14 0255 04/01/14 0725  NA 132* 138  K 4.2 4.2  CL 91* 95*  CO2 30 34*  GLUCOSE 178* 123*  BUN 42* 38*  CREATININE 1.26 1.11  CALCIUM 8.4 8.7  MG 2.0 2.0  PHOS 3.0 3.1   Cardiac Enzymes  Recent Labs  03/30/14 1100  TROPONINI 0.34*    TELE  NSR with HR 70-90s, LBBB  ECG  NSR with HR 70s, LBBB  Radiology/Studies  Dg Chest 2 View  03/21/2014   CLINICAL DATA:  Preoperative evaluation; hypertension ; emphysema  EXAM: CHEST  2 VIEW  COMPARISON:  None.  FINDINGS: There is underlying emphysema. There is extensive interstitial fibrosis bilaterally. There may be a degree of interstitial edema superimposed. There is no airspace consolidation. The the heart  size is normal. The pulmonary vascularity reflects underlying emphysema. No adenopathy. No bone lesions.  IMPRESSION: Emphysema with widespread interstitial fibrosis. There may be some superimposed interstitial edema ; a degree of superimposed congestive heart failure cannot be excluded radiographically. There is no airspace consolidation or adenopathy apparent.   Electronically Signed   By: Bretta Bang M.D.   On: 03/21/2014 21:35   Ct Chest Wo Contrast  03/25/2014   ADDENDUM REPORT: 03/25/2014 16:49  ADDENDUM: There is also diffuse distal esophageal wall thickening. This could be due to incomplete distention, esophagitis or mass. These possibilities could be differentiated with direct endoscopic visualization.   Electronically Signed   By: Gordan Payment M.D.   On: 03/25/2014 16:49   03/25/2014   CLINICAL DATA:  Evaluate lung infiltrates seen on the portable  chest earlier today.  EXAM: CT CHEST WITHOUT CONTRAST  TECHNIQUE: Multidetector CT imaging of the chest was performed following the standard protocol without IV contrast.  COMPARISON:  Portable chest obtained earlier today. Abdomen CT dated 02/19/2014.  FINDINGS: Interval diffuse increase in prominence of the interstitial markings throughout the majority of both lungs, including ground-glass opacities. Extensive bullous changes bilaterally. No masses or focal consolidation. Enlarged precarinal node with a short axis diameter of 19.7 mm on image number 23. Mildly enlarged AP window nodes, the largest with a short axis diameter of 9.3 mm on image number 22.  Small right pleural effusion. Dense coronary artery calcifications. Diffuse low density of the blood. Diffuse distal esophageal wall thickening with a maximum thickness of 10.3 mm on image number 49. Unremarkable upper abdomen. Mild thoracic spine degenerative changes.  IMPRESSION: 1. Extensive bilateral acute interstitial lung disease superimposed on marked changes of COPD. Differential considerations include pulmonary edema and viral pneumonitis. 2. Small right pleural effusion. 3. Mild mediastinal adenopathy, possibly reactive. 4. Dense coronary artery atheromatous calcifications. 5. Anemia.  Electronically Signed: By: Gordan Payment M.D. On: 03/25/2014 16:46   Ir Angiogram Extremity Right  03/22/2014   CLINICAL DATA:  Thrombosed left femoral popliteal arterial saphenous vein bypass graft.  EXAM: IR INFUSION THROMBOL ARTERIAL INITIAL (MS); RIGHT EXTREMITY ARTERIOGRAPHY; IR ULTRASOUND GUIDANCE VASC ACCESS RIGHT  FLUOROSCOPY TIME:  20 min and 12 seconds.  MEDICATIONS AND MEDICAL HISTORY: Versed two mg, Fentanyl 100 mcg.  Additional Medications: Heparin 5000 units.  ANESTHESIA/SEDATION: Moderate sedation time: 60 minutes  CONTRAST:  150 cc Omnipaque 300  PROCEDURE: The procedure, risks, benefits, and alternatives were explained to the patient. Questions regarding  the procedure were encouraged and answered. The patient understands and consents to the procedure.  The right groin was prepped with Betadine in a sterile fashion, and a sterile drape was applied covering the operative field. A sterile gown and sterile gloves were used for the procedure.  Under sonographic guidance, a micropuncture needle was inserted into the right common femoral artery and removed over a 018 wire, which was up sized to a Bentson. A 5 French sheath was inserted. A 4 French omni Flush catheter was advanced over the wire to the aorta. Pelvic angiography was performed.  The Omni Flush catheter was carefully advanced over a glidewire into the left common iliac artery and finally into the left external iliac artery. The catheter was removed over a Rosen wire. The 5 French sheath was then exchanged for a 35 cm 6 French sheath. This could be advanced to the upper right common iliac artery. It could not be advanced across the bifurcation secondary to significant plaque.  A total of  5000 units of heparin was injected peripherally.  A vertebral 5 French catheter was advanced over the [place] wire to the left external iliac artery and angiography of the left groin and proximal thigh were obtained.  The vertebral catheter was then advanced over a stiff glidewire into the thrombosed venous bypass graft, to the level of the knee. The catheter was removed over a Rosen wire. A 135 cm 50 cm infusion length 5 French multi side hole catheter was then advanced over the [place] wire to the level of the knee. The catheter and sheath were secured in place. TNK at 0.5 milligrams/hour was instituted into the infusion catheter.  FINDINGS: Pelvic angiography demonstrates atherosclerotic plaque at the origin of the right and left common iliac arteries without narrowing greater than 50%. The left external iliac artery is patent.  The left common femoral artery is patent. The left superficial femoral artery is severely diseased.  The left femoral popliteal artery is occluded.  The next series of images demonstrate placement of an infusion catheter into the femoral popliteal artery bypass graft.  COMPLICATIONS: None  IMPRESSION: Successful placement of an infusion catheter into a thrombosed left femoral popliteal artery bypass graft. TNK was instituted at 0.5 milligrams/hour.   Electronically Signed   By: Maryclare Bean M.D.   On: 03/22/2014 14:49   Ir Fem Pop Art Pta Mod Sed  03/24/2014   CLINICAL DATA:  48 hr post left lower extremity fem-pop graft lysis.  EXAM: IR THROMB F/U EVAL ART/VEN FINAL DAY; IR FEM POP ART PTA  FLUOROSCOPY TIME:  17 min and 21 seconds.  MEDICATIONS AND MEDICAL HISTORY: Versed 0.5 mg, Fentanyl 25 mcg.  Additional Medications: Heparin 500 units.  ANESTHESIA/SEDATION: Moderate sedation time: 85 minutes  CONTRAST:  50 cc Visipaque 320  PROCEDURE: The procedure, risks, benefits, and alternatives were explained to the patient. Questions regarding the procedure were encouraged and answered. The patient understands and consents to the procedure.  The right groin was prepped with Betadine in a sterile fashion, and a sterile drape was applied covering the operative field. A sterile gown and sterile gloves were used for the procedure.  Contrast was injected into the multi side-hole catheter and a right lower extremity runoff was performed. This demonstrated resolution of the thrombus with residual narrowing at the distal graft to tibioperoneal trunk anastomosis as well as a distal tibioperoneal trunk stenosis. The heparin was stopped during the procedure. After approximately 20 min, repeat angiogram demonstrates recurrent early occlusion at these areas of narrowing. 500 units of additional heparin was administered.  The infusion catheter was exchanged over a 300 cm stiff a 018 wire for a 4 mm x 2 cm balloon.  A glidewire was advanced through the balloon and across the 2 lesions into the peroneal artery. The glidewire was  exchanged for the stiff 018 wire. The 2 lesions were dilated.  Post angioplasty imaging with angiography demonstrates a dissection in the tibioperoneal trunk narrowing location. This area was read dilated and held in place for 60 seconds. Repeat angiogram demonstrates wide patency at the 2 areas of stenosis.  A full left lower extremity runoff demonstrates patency of the left common femoral artery, bypass graft, tibioperoneal trunk, and with 2 vessel runoff via the posterior tibial and peroneal arteries.  The sheath was removed and hemostasis was achieved with an exo seal device.  FINDINGS: Initial angiogram demonstrates resolution of the thrombus throughout the fem-pop venous bypass graft but 2 areas of narrowing distally, at the distal anastomosis,  and within the tibioperoneal trunk.  After angioplasty, initial repeat imaging demonstrates a dissection at the tibioperoneal trunk stenosis.  After repeat angioplasty, the vessel was noted to be widely patent with some irregularity.  Left lower extremity runoff was performed demonstrating patency of the graft, distal anastomosis to the tibioperoneal trunk, and 2 vessel runoff via the posterior tibial artery and peroneal artery.  COMPLICATIONS: None  IMPRESSION: Successful lysis of a femoral popliteal bypass graft and successful angioplasty of 2 areas of infrapopliteal narrowing as described.   Electronically Signed   By: Maryclare BeanArt  Hoss M.D.   On: 03/24/2014 12:07   Ir [place] Guide Vasc Access Right  03/22/2014   CLINICAL DATA:  Thrombosed left femoral popliteal arterial saphenous vein bypass graft.  EXAM: IR INFUSION THROMBOL ARTERIAL INITIAL (MS); RIGHT EXTREMITY ARTERIOGRAPHY; IR ULTRASOUND GUIDANCE VASC ACCESS RIGHT  FLUOROSCOPY TIME:  20 min and 12 seconds.  MEDICATIONS AND MEDICAL HISTORY: Versed two mg, Fentanyl 100 mcg.  Additional Medications: Heparin 5000 units.  ANESTHESIA/SEDATION: Moderate sedation time: 60 minutes  CONTRAST:  150 cc Omnipaque 300  PROCEDURE: The  procedure, risks, benefits, and alternatives were explained to the patient. Questions regarding the procedure were encouraged and answered. The patient understands and consents to the procedure.  The right groin was prepped with Betadine in a sterile fashion, and a sterile drape was applied covering the operative field. A sterile gown and sterile gloves were used for the procedure.  Under sonographic guidance, a micropuncture needle was inserted into the right common femoral artery and removed over a 018 wire, which was up sized to a Bentson. A 5 French sheath was inserted. A 4 French omni Flush catheter was advanced over the wire to the aorta. Pelvic angiography was performed.  The Omni Flush catheter was carefully advanced over a glidewire into the left common iliac artery and finally into the left external iliac artery. The catheter was removed over a Rosen wire. The 5 French sheath was then exchanged for a 35 cm 6 French sheath. This could be advanced to the upper right common iliac artery. It could not be advanced across the bifurcation secondary to significant plaque.  A total of 5000 units of heparin was injected peripherally.  A vertebral 5 French catheter was advanced over the [place] wire to the left external iliac artery and angiography of the left groin and proximal thigh were obtained.  The vertebral catheter was then advanced over a stiff glidewire into the thrombosed venous bypass graft, to the level of the knee. The catheter was removed over a Rosen wire. A 135 cm 50 cm infusion length 5 French multi side hole catheter was then advanced over the [place] wire to the level of the knee. The catheter and sheath were secured in place. TNK at 0.5 milligrams/hour was instituted into the infusion catheter.  FINDINGS: Pelvic angiography demonstrates atherosclerotic plaque at the origin of the right and left common iliac arteries without narrowing greater than 50%. The left external iliac artery is patent.  The  left common femoral artery is patent. The left superficial femoral artery is severely diseased. The left femoral popliteal artery is occluded.  The next series of images demonstrate placement of an infusion catheter into the femoral popliteal artery bypass graft.  COMPLICATIONS: None  IMPRESSION: Successful placement of an infusion catheter into a thrombosed left femoral popliteal artery bypass graft. TNK was instituted at 0.5 milligrams/hour.   Electronically Signed   By: Maryclare BeanArt  Hoss M.D.   On: 03/22/2014 14:49  Dg Chest Port 1 View  03/30/2014   CLINICAL DATA:  80 year old male with shortness of breath  EXAM: PORTABLE CHEST - 1 VIEW  COMPARISON:  03/28/2014 and prior chest radiographs dating back to 03/21/2014  FINDINGS: The cardiomediastinal silhouette is stable.  A right PICC line is again identified with tip overlying upper right atrium.  Diffuse bilateral airspace opacities are again noted and relatively unchanged.  There is no evidence of pneumothorax or definite effusion.  IMPRESSION: Stable chest radiograph with diffuse bilateral airspace opacities.   Electronically Signed   By: Laveda Abbe M.D.   On: 03/30/2014 12:38   Dg Chest Port 1 View  03/28/2014   CLINICAL DATA:  Check endotracheal tube placement  EXAM: PORTABLE CHEST - 1 VIEW  COMPARISON:  03/27/2014  FINDINGS: A right-sided PICC line is again noted and stable. No endotracheal tube is identified at this time. Diffuse increased density is again noted in the right mid and upper lung as well as the left lower lung. The overall appearance is stable from the prior exam.  IMPRESSION: No significant change from the prior day. No endotracheal tube is noted.   Electronically Signed   By: Alcide Clever M.D.   On: 03/28/2014 07:49   Dg Chest Port 1 View  03/27/2014   CLINICAL DATA:  Lung infiltrates  EXAM: PORTABLE CHEST - 1 VIEW  COMPARISON:  Chest radiograph and chest CT March 25, 2014  FINDINGS: There is underlying emphysematous change. There is a degree  of underlying interstitial fibrosis. Widespread interstitial and alveolar edema remain without appreciable change. Heart is mildly enlarged. The pulmonary vascularity is within normal limits. Central catheter tip is in the right atrium slightly distal to the cavoatrial junction, stable. No pneumothorax.  IMPRESSION: Suspect congestive heart failure superimposed on chronic interstitial fibrosis and emphysema. There may well be underlying infectious pneumonia as well. The appearance is stable compared to 2 days prior.   Electronically Signed   By: Bretta Bang M.D.   On: 03/27/2014 07:19   Dg Chest Port 1 View  03/25/2014   CLINICAL DATA:  Shortness of breath  EXAM: PORTABLE CHEST - 1 VIEW  COMPARISON:  03/24/2014  FINDINGS: Cardiac shadow is within normal limits. Diffuse bilateral infiltrates are again identified. A PICC line is now been placed on the right with the catheter tip at the cavoatrial junction. No pneumothorax is seen. No other focal abnormality is noted.  IMPRESSION: New PICC line in satisfactory position.  Bilateral parenchymal infiltrates stable from the previous day.   Electronically Signed   By: Alcide Clever M.D.   On: 03/25/2014 09:12   Dg Chest Port 1 View  03/24/2014   CLINICAL DATA:  Short of breath, coughing congestion  EXAM: PORTABLE CHEST - 1 VIEW  COMPARISON:  Prior chest x-ray 03/24/2014 at 3:28 a.m.  FINDINGS: Persistent diffuse bilateral predominantly interstitial opacities. There has been no significant interval change compared to the radiograph from earlier this morning. Cardiac and mediastinal contours remain unchanged. Atherosclerotic calcifications are present within the transverse aorta. Upper abdominal gas pattern is unremarkable. No acute osseous abnormality.  IMPRESSION: No significant interval change in the appearance of the lungs. Persistent diffuse bilateral interstitial opacities which may reflect pulmonary fibrosis, interstitial edema or atypical infection.    Electronically Signed   By: Malachy Moan M.D.   On: 03/24/2014 14:40   Dg Chest Port 1 View  03/24/2014   CLINICAL DATA:  Hemoptysis and wheezing.  EXAM: PORTABLE CHEST - 1 VIEW  COMPARISON:  03/21/2014  FINDINGS: Borderline heart size. Diffuse coarse interstitial infiltrates throughout both lungs likely representing diffuse interstitial fibrosis. No significant change since previous study, allowing for technical differences. No focal consolidation. No blunting of costophrenic angles. No pneumothorax.  IMPRESSION: Diffuse interstitial infiltrates in the lungs likely fibrosis. No change since prior study.   Electronically Signed   By: Burman Nieves M.D.   On: 03/24/2014 04:05   Ir Infusion Thrombol Arterial Initial (ms)  03/22/2014   CLINICAL DATA:  Thrombosed left femoral popliteal arterial saphenous vein bypass graft.  EXAM: IR INFUSION THROMBOL ARTERIAL INITIAL (MS); RIGHT EXTREMITY ARTERIOGRAPHY; IR ULTRASOUND GUIDANCE VASC ACCESS RIGHT  FLUOROSCOPY TIME:  20 min and 12 seconds.  MEDICATIONS AND MEDICAL HISTORY: Versed two mg, Fentanyl 100 mcg.  Additional Medications: Heparin 5000 units.  ANESTHESIA/SEDATION: Moderate sedation time: 60 minutes  CONTRAST:  150 cc Omnipaque 300  PROCEDURE: The procedure, risks, benefits, and alternatives were explained to the patient. Questions regarding the procedure were encouraged and answered. The patient understands and consents to the procedure.  The right groin was prepped with Betadine in a sterile fashion, and a sterile drape was applied covering the operative field. A sterile gown and sterile gloves were used for the procedure.  Under sonographic guidance, a micropuncture needle was inserted into the right common femoral artery and removed over a 018 wire, which was up sized to a Bentson. A 5 French sheath was inserted. A 4 French omni Flush catheter was advanced over the wire to the aorta. Pelvic angiography was performed.  The Omni Flush catheter was  carefully advanced over a glidewire into the left common iliac artery and finally into the left external iliac artery. The catheter was removed over a Rosen wire. The 5 French sheath was then exchanged for a 35 cm 6 French sheath. This could be advanced to the upper right common iliac artery. It could not be advanced across the bifurcation secondary to significant plaque.  A total of 5000 units of heparin was injected peripherally.  A vertebral 5 French catheter was advanced over the [place] wire to the left external iliac artery and angiography of the left groin and proximal thigh were obtained.  The vertebral catheter was then advanced over a stiff glidewire into the thrombosed venous bypass graft, to the level of the knee. The catheter was removed over a Rosen wire. A 135 cm 50 cm infusion length 5 French multi side hole catheter was then advanced over the [place] wire to the level of the knee. The catheter and sheath were secured in place. TNK at 0.5 milligrams/hour was instituted into the infusion catheter.  FINDINGS: Pelvic angiography demonstrates atherosclerotic plaque at the origin of the right and left common iliac arteries without narrowing greater than 50%. The left external iliac artery is patent.  The left common femoral artery is patent. The left superficial femoral artery is severely diseased. The left femoral popliteal artery is occluded.  The next series of images demonstrate placement of an infusion catheter into the femoral popliteal artery bypass graft.  COMPLICATIONS: None  IMPRESSION: Successful placement of an infusion catheter into a thrombosed left femoral popliteal artery bypass graft. TNK was instituted at 0.5 milligrams/hour.   Electronically Signed   By: Maryclare Bean M.D.   On: 03/22/2014 14:49   Ir Rande Lawman F/u Eval Art/ven Basil Dess Day (ms)  03/23/2014   CLINICAL DATA:  24 hr post left lower extremity arterial lysis check  EXAM: IR THROMB F/U EVAL ART/VEN SUBSEQ  DAY (MS)  FLUOROSCOPY TIME:  30  seconds.  MEDICATIONS AND MEDICAL HISTORY: None  ANESTHESIA/SEDATION: None  CONTRAST:  20 cc Omnipaque 350  PROCEDURE: The procedure, risks, benefits, and alternatives were explained to the patient. Questions regarding the procedure were encouraged and answered. The patient understands and consents to the procedure.  Contrast was injected into the infusion catheter and imaging was obtained.  FINDINGS: The left common femoral artery remains patent.  The saphenous vein graft is patent to the level of the knee. It is occluded at the knee. The infusion catheter remains in place. Runoff tibial vessels were not seen to opacified.  COMPLICATIONS: None  IMPRESSION: There is improvement with near complete lysed cysts of the saphenous vein graft. There continues to be occlusive thrombus distally. The plan is for an additional 24 hr of lysis with a follow-up check tomorrow morning.   Electronically Signed   By: Maryclare Bean M.D.   On: 03/23/2014 09:12   Ir Rande Lawman F/u Eval Art/ven Final Day (ms)  03/24/2014   CLINICAL DATA:  48 hr post left lower extremity fem-pop graft lysis.  EXAM: IR THROMB F/U EVAL ART/VEN FINAL DAY; IR FEM POP ART PTA  FLUOROSCOPY TIME:  17 min and 21 seconds.  MEDICATIONS AND MEDICAL HISTORY: Versed 0.5 mg, Fentanyl 25 mcg.  Additional Medications: Heparin 500 units.  ANESTHESIA/SEDATION: Moderate sedation time: 85 minutes  CONTRAST:  50 cc Visipaque 320  PROCEDURE: The procedure, risks, benefits, and alternatives were explained to the patient. Questions regarding the procedure were encouraged and answered. The patient understands and consents to the procedure.  The right groin was prepped with Betadine in a sterile fashion, and a sterile drape was applied covering the operative field. A sterile gown and sterile gloves were used for the procedure.  Contrast was injected into the multi side-hole catheter and a right lower extremity runoff was performed. This demonstrated resolution of the thrombus with  residual narrowing at the distal graft to tibioperoneal trunk anastomosis as well as a distal tibioperoneal trunk stenosis. The heparin was stopped during the procedure. After approximately 20 min, repeat angiogram demonstrates recurrent early occlusion at these areas of narrowing. 500 units of additional heparin was administered.  The infusion catheter was exchanged over a 300 cm stiff a 018 wire for a 4 mm x 2 cm balloon.  A glidewire was advanced through the balloon and across the 2 lesions into the peroneal artery. The glidewire was exchanged for the stiff 018 wire. The 2 lesions were dilated.  Post angioplasty imaging with angiography demonstrates a dissection in the tibioperoneal trunk narrowing location. This area was read dilated and held in place for 60 seconds. Repeat angiogram demonstrates wide patency at the 2 areas of stenosis.  A full left lower extremity runoff demonstrates patency of the left common femoral artery, bypass graft, tibioperoneal trunk, and with 2 vessel runoff via the posterior tibial and peroneal arteries.  The sheath was removed and hemostasis was achieved with an exo seal device.  FINDINGS: Initial angiogram demonstrates resolution of the thrombus throughout the fem-pop venous bypass graft but 2 areas of narrowing distally, at the distal anastomosis, and within the tibioperoneal trunk.  After angioplasty, initial repeat imaging demonstrates a dissection at the tibioperoneal trunk stenosis.  After repeat angioplasty, the vessel was noted to be widely patent with some irregularity.  Left lower extremity runoff was performed demonstrating patency of the graft, distal anastomosis to the tibioperoneal trunk, and 2 vessel runoff via the posterior tibial  artery and peroneal artery.  COMPLICATIONS: None  IMPRESSION: Successful lysis of a femoral popliteal bypass graft and successful angioplasty of 2 areas of infrapopliteal narrowing as described.   Electronically Signed   By: Maryclare Bean M.D.    On: 03/24/2014 12:07    ASSESSMENT AND PLAN  1. Acute respiratory failure  - h/o contrast allergy s/p angiography and TNK for lysis of distal clot of LE bypass graft 7/31. Started to have  significant dyspnea on 8/2,  - per [facility] IPF flare following angiography  - increasing O2 requirement  2. NSTEMI with anterior akinesis on echo  - in the setting of severe anemia, trop 0.49 --> 0.41 --> 0.42  - plan for possible [facility] cath once pulm function improves, hold for now given recent acute resp failure in the setting of angiography and contrast allergy  - will need pretreatment prior to cath for allergy to omnipaque given recent IPF flare after angiography, does have h/o allergy of prednisone, however tolerating benadryl with solu-medrol. If plan for cath, will need  solumedrol, benadryl and pepcid on board.   3. Acute systolic heart failure with EF 30-35%  - ?underlying ischemia vs takotsubo's, given LBBB, catheterization is recommended, but not safe to do that  from a respiratory standpoint.  - continue lasix, Cr continue to improve  4. Anemia  5. Acute renal insufficiency - creatinine is slowly improving  6. LBBB   Signed, Azalee Course PA-C Pager: [phone]  Attending Note:   The patient was seen and examined.  Agree with assessment and plan as noted above.  Changes made to the above note as needed.  He seems to be gradually declining from a pulmonary standpoint.   I would like to wait and do cath when he is more stable.  He is not having any angina at this point.   He will need premedication prior to his cath.   Vesta Mixer, Montez Hageman., MD, [facility] 04/01/2014, 12:35 PM [address],  [address] Office [phone] Pager [phone]

## 2014-04-01 NOTE — Progress Notes (Signed)
PT Cancellation Note  Patient Details Name: Trevor Morris MRN: [id number] DOB: 1934-03-02   Cancelled Treatment:    Reason Eval/Treat Not Completed: Medical issues which prohibited therapy, please re- consult when medically appropriate.   Fabio Asa 04/01/2014, 10:32 AM Charlotte Crumb, PT DPT  [phone]

## 2014-04-01 NOTE — Progress Notes (Signed)
Utilization review completed.  

## 2014-04-02 ENCOUNTER — Inpatient Hospital Stay (HOSPITAL_COMMUNITY): Payer: Medicare Other

## 2014-04-02 DIAGNOSIS — I5021 Acute systolic (congestive) heart failure: Secondary | ICD-10-CM

## 2014-04-02 DIAGNOSIS — R0989 Other specified symptoms and signs involving the circulatory and respiratory systems: Secondary | ICD-10-CM

## 2014-04-02 DIAGNOSIS — I739 Peripheral vascular disease, unspecified: Secondary | ICD-10-CM

## 2014-04-02 LAB — GLUCOSE, CAPILLARY
GLUCOSE-CAPILLARY: 165 mg/dL — AB (ref 70–99)
GLUCOSE-CAPILLARY: 234 mg/dL — AB (ref 70–99)
GLUCOSE-CAPILLARY: 220 mg/dL — AB (ref 70–99)
GLUCOSE-CAPILLARY: 72 mg/dL (ref 70–99)
Glucose-Capillary: 152 mg/dL — ABNORMAL HIGH (ref 70–99)
Glucose-Capillary: 449 mg/dL — ABNORMAL HIGH (ref 70–99)
Glucose-Capillary: 360 mg/dL — ABNORMAL HIGH (ref 70–99)

## 2014-04-02 LAB — CBC
HCT: 26.7 % — ABNORMAL LOW (ref 39.0–52.0)
Hemoglobin: 9.1 g/dL — ABNORMAL LOW (ref 13.0–17.0)
MCH: 29.6 pg (ref 26.0–34.0)
MCHC: 34.1 g/dL (ref 30.0–36.0)
MCV: 87 fL (ref 78.0–100.0)
PLATELETS: 310 10*3/uL (ref 150–400)
RBC: 3.07 MIL/uL — ABNORMAL LOW (ref 4.22–5.81)
RDW: 15.1 % (ref 11.5–15.5)
WBC: 19.1 10*3/uL — AB (ref 4.0–10.5)

## 2014-04-02 LAB — BASIC METABOLIC PANEL
Anion gap: 10 (ref 5–15)
BUN: 41 mg/dL — ABNORMAL HIGH (ref 6–23)
CALCIUM: 8.7 mg/dL (ref 8.4–10.5)
CO2: 31 mEq/L (ref 19–32)
Chloride: 92 mEq/L — ABNORMAL LOW (ref 96–112)
Creatinine, Ser: 1.07 mg/dL (ref 0.50–1.35)
GFR calc non Af Amer: 64 mL/min — ABNORMAL LOW (ref >90)
GFR, EST AFRICAN AMERICAN: 75 mL/min — AB (ref >90)
Glucose, Bld: 160 mg/dL — ABNORMAL HIGH (ref 70–99)
POTASSIUM: 4.6 meq/L (ref 3.7–5.3)
Sodium: 133 mEq/L — ABNORMAL LOW (ref 137–147)

## 2014-04-02 LAB — HEPARIN LEVEL (UNFRACTIONATED): Heparin Unfractionated: 0.3 IU/mL (ref 0.30–0.70)

## 2014-04-02 LAB — PHOSPHORUS: PHOSPHORUS: 3.3 mg/dL (ref 2.3–4.6)

## 2014-04-02 LAB — MAGNESIUM: Magnesium: 2.2 mg/dL (ref 1.5–2.5)

## 2014-04-02 MED ORDER — FUROSEMIDE 10 MG/ML IJ SOLN
40.0000 mg | Freq: Once | INTRAMUSCULAR | Status: AC
Start: 2014-04-02 — End: 2014-04-02
  Administered 2014-04-02: 40 mg via INTRAVENOUS
  Filled 2014-04-02: qty 4

## 2014-04-02 MED ORDER — DEXTROSE 5 % IV SOLN
1.0000 g | INTRAVENOUS | Status: DC
  Administered 2014-04-02 – 2014-04-04 (×3): 1 g via INTRAVENOUS
  Filled 2014-04-02 (×3): qty 1

## 2014-04-02 MED ORDER — VANCOMYCIN HCL 500 MG IV SOLR
500.0000 mg | Freq: Two times a day (BID) | INTRAVENOUS | Status: DC
  Administered 2014-04-02 – 2014-04-04 (×4): 500 mg via INTRAVENOUS
  Filled 2014-04-02 (×6): qty 500

## 2014-04-02 MED ORDER — MORPHINE SULFATE 2 MG/ML IJ SOLN
1.0000 mg | INTRAMUSCULAR | Status: DC | PRN
  Administered 2014-04-02: 2 mg via INTRAVENOUS
  Administered 2014-04-02: 1 mg via INTRAVENOUS
  Administered 2014-04-03: 2 mg via INTRAVENOUS
  Filled 2014-04-02 (×4): qty 1

## 2014-04-02 MED ORDER — ALPRAZOLAM 0.25 MG PO TABS
0.2500 mg | ORAL_TABLET | Freq: Three times a day (TID) | ORAL | Status: DC | PRN
  Administered 2014-04-02: 0.25 mg via ORAL
  Filled 2014-04-02: qty 1

## 2014-04-02 MED ORDER — VANCOMYCIN HCL IN DEXTROSE 1-5 GM/200ML-% IV SOLN
1000.0000 mg | INTRAVENOUS | Status: AC
  Administered 2014-04-02: 1000 mg via INTRAVENOUS
  Filled 2014-04-02: qty 200

## 2014-04-02 NOTE — Progress Notes (Signed)
Patient Name: Trevor Morris Date of Encounter: 04/02/2014     Active Problems:   Occlusion of left femoropopliteal bypass graft   Acute respiratory failure with hypoxia   NSTEMI (non-ST elevated myocardial infarction)   COPD (chronic obstructive pulmonary disease)   Acute combined systolic and diastolic heart failure   Hypokalemia    SUBJECTIVE  Per daughter, her father was having a lot of anxiety recently. However she states he has not SOB at baseline however was able to take care of himself. She doesn't fully understand why his condition has not shown any significant improvement since moving out of ICU. She felt that the treatment plan and direction has not been communicated clearly with family. Per staff, patient had an uneventful night.  CURRENT MEDS . sodium chloride   Intravenous Once  . aspirin EC  325 mg Oral Daily  . atorvastatin  80 mg Oral q1800  . carvedilol  3.125 mg Oral BID WC  . ceFEPime (MAXIPIME) IV  1 g Intravenous Q24H  . feeding supplement (GLUCERNA SHAKE)  237 mL Oral TID BM  . furosemide  40 mg Oral Daily  . insulin aspart  0-15 Units Subcutaneous 6 times per day  . insulin glargine  10 Units Subcutaneous Daily  . lisinopril  20 mg Oral Daily  . methylPREDNISolone (SOLU-MEDROL) injection  40 mg Intravenous Q12H  . multivitamin with minerals  1 tablet Oral Daily  . pantoprazole  40 mg Oral BID AC  . sodium chloride  10-40 mL Intracatheter Q12H  . sodium chloride  3 mL Intravenous Q12H  . vancomycin  500 mg Intravenous Q12H  . vancomycin  1,000 mg Intravenous NOW    OBJECTIVE  Filed Vitals:   04/01/14 1947 04/01/14 2306 04/02/14 0401 04/02/14 0729  BP: 104/34 153/55 146/51 144/61  Pulse: 81 89 81 89  Temp: 97.5 F (36.4 C) 98.6 F (37 C) 97.5 F (36.4 C) 97.9 F (36.6 C)  TempSrc: Oral Oral Oral Oral  Resp: 33 35 30 32  Height:      Weight:      SpO2: 96% 99% 100% 94%    Intake/Output Summary (Last 24 hours) at 04/02/14 1113 Last  data filed at 04/02/14 0800  Gross per 24 hour  Intake  296.5 ml  Output    625 ml  Net -328.5 ml   Filed Weights   03/28/14 0500 03/29/14 0500 03/29/14 2225  Weight: 126 lb 5.2 oz (57.3 kg) 125 lb 3.5 oz (56.8 kg) 127 lb 13.9 oz (58 kg)    PHYSICAL EXAM  General: Pleasant, NAD. Sitting in chair, O2 mask on. Neuro: Alert and oriented X 3. Moves all extremities spontaneously. Psych: Normal affect. HEENT:  Normal  Neck: Supple without bruits or JVD. Lungs:  Resp regular and unlabored, bibasilar rale, ?atelectasis vs edema Heart: RRR no s3, s4, or murmurs. Abdomen: Soft, non-tender, non-distended, BS + x 4.  Extremities: No clubbing, cyanosis or edema. DP/PT/Radials 2+ and equal bilaterally.  Accessory Clinical Findings  CBC  Recent Labs  04/01/14 0725 04/02/14 0235  WBC 17.2* 19.1*  HGB 9.1* 9.1*  HCT 26.6* 26.7*  MCV 86.4 87.0  PLT 295 310   Basic Metabolic Panel  Recent Labs  04/01/14 0725 04/02/14 0235  NA 138 133*  K 4.2 4.6  CL 95* 92*  CO2 34* 31  GLUCOSE 123* 160*  BUN 38* 41*  CREATININE 1.11 1.07  CALCIUM 8.7 8.7  MG 2.0 2.2  PHOS 3.1 3.3  TELE  NSR with HR 70-90s, no significant ventricular ectopy  ECG  NSR with LBBB, HR 70s  Radiology/Studies  Dg Chest 2 View  03/21/2014   CLINICAL DATA:  Preoperative evaluation; hypertension ; emphysema  EXAM: CHEST  2 VIEW  COMPARISON:  None.  FINDINGS: There is underlying emphysema. There is extensive interstitial fibrosis bilaterally. There may be a degree of interstitial edema superimposed. There is no airspace consolidation. The the heart size is normal. The pulmonary vascularity reflects underlying emphysema. No adenopathy. No bone lesions.  IMPRESSION: Emphysema with widespread interstitial fibrosis. There may be some superimposed interstitial edema ; a degree of superimposed congestive heart failure cannot be excluded radiographically. There is no airspace consolidation or adenopathy apparent.    Electronically Signed   By: Bretta Bang M.D.   On: 03/21/2014 21:35   Ct Chest Wo Contrast  03/25/2014   ADDENDUM REPORT: 03/25/2014 16:49  ADDENDUM: There is also diffuse distal esophageal wall thickening. This could be due to incomplete distention, esophagitis or mass. These possibilities could be differentiated with direct endoscopic visualization.   Electronically Signed   By: Gordan Payment M.D.   On: 03/25/2014 16:49   03/25/2014   CLINICAL DATA:  Evaluate lung infiltrates seen on the portable chest earlier today.  EXAM: CT CHEST WITHOUT CONTRAST  TECHNIQUE: Multidetector CT imaging of the chest was performed following the standard protocol without IV contrast.  COMPARISON:  Portable chest obtained earlier today. Abdomen CT dated 02/19/2014.  FINDINGS: Interval diffuse increase in prominence of the interstitial markings throughout the majority of both lungs, including ground-glass opacities. Extensive bullous changes bilaterally. No masses or focal consolidation. Enlarged precarinal node with a short axis diameter of 19.7 mm on image number 23. Mildly enlarged AP window nodes, the largest with a short axis diameter of 9.3 mm on image number 22.  Small right pleural effusion. Dense coronary artery calcifications. Diffuse low density of the blood. Diffuse distal esophageal wall thickening with a maximum thickness of 10.3 mm on image number 49. Unremarkable upper abdomen. Mild thoracic spine degenerative changes.  IMPRESSION: 1. Extensive bilateral acute interstitial lung disease superimposed on marked changes of COPD. Differential considerations include pulmonary edema and viral pneumonitis. 2. Small right pleural effusion. 3. Mild mediastinal adenopathy, possibly reactive. 4. Dense coronary artery atheromatous calcifications. 5. Anemia.  Electronically Signed: By: Gordan Payment M.D. On: 03/25/2014 16:46   Ir Angiogram Extremity Right  03/22/2014   CLINICAL DATA:  Thrombosed left femoral popliteal arterial  saphenous vein bypass graft.  EXAM: IR INFUSION THROMBOL ARTERIAL INITIAL (MS); RIGHT EXTREMITY ARTERIOGRAPHY; IR ULTRASOUND GUIDANCE VASC ACCESS RIGHT  FLUOROSCOPY TIME:  20 min and 12 seconds.  MEDICATIONS AND MEDICAL HISTORY: Versed two mg, Fentanyl 100 mcg.  Additional Medications: Heparin 5000 units.  ANESTHESIA/SEDATION: Moderate sedation time: 60 minutes  CONTRAST:  150 cc Omnipaque 300  PROCEDURE: The procedure, risks, benefits, and alternatives were explained to the patient. Questions regarding the procedure were encouraged and answered. The patient understands and consents to the procedure.  The right groin was prepped with Betadine in a sterile fashion, and a sterile drape was applied covering the operative field. A sterile gown and sterile gloves were used for the procedure.  Under sonographic guidance, a micropuncture needle was inserted into the right common femoral artery and removed over a 018 wire, which was up sized to a Bentson. A 5 French sheath was inserted. A 4 French omni Flush catheter was advanced over the wire to the aorta. Pelvic angiography  was performed.  The Omni Flush catheter was carefully advanced over a glidewire into the left common iliac artery and finally into the left external iliac artery. The catheter was removed over a Rosen wire. The 5 French sheath was then exchanged for a 35 cm 6 French sheath. This could be advanced to the upper right common iliac artery. It could not be advanced across the bifurcation secondary to significant plaque.  A total of 5000 units of heparin was injected peripherally.  A vertebral 5 French catheter was advanced over the [place] wire to the left external iliac artery and angiography of the left groin and proximal thigh were obtained.  The vertebral catheter was then advanced over a stiff glidewire into the thrombosed venous bypass graft, to the level of the knee. The catheter was removed over a Rosen wire. A 135 cm 50 cm infusion length 5 French  multi side hole catheter was then advanced over the [place] wire to the level of the knee. The catheter and sheath were secured in place. TNK at 0.5 milligrams/hour was instituted into the infusion catheter.  FINDINGS: Pelvic angiography demonstrates atherosclerotic plaque at the origin of the right and left common iliac arteries without narrowing greater than 50%. The left external iliac artery is patent.  The left common femoral artery is patent. The left superficial femoral artery is severely diseased. The left femoral popliteal artery is occluded.  The next series of images demonstrate placement of an infusion catheter into the femoral popliteal artery bypass graft.  COMPLICATIONS: None  IMPRESSION: Successful placement of an infusion catheter into a thrombosed left femoral popliteal artery bypass graft. TNK was instituted at 0.5 milligrams/hour.   Electronically Signed   By: Maryclare BeanArt  Hoss M.D.   On: 03/22/2014 14:49   Ir Fem Pop Art Pta Mod Sed  03/24/2014   CLINICAL DATA:  48 hr post left lower extremity fem-pop graft lysis.  EXAM: IR THROMB F/U EVAL ART/VEN FINAL DAY; IR FEM POP ART PTA  FLUOROSCOPY TIME:  17 min and 21 seconds.  MEDICATIONS AND MEDICAL HISTORY: Versed 0.5 mg, Fentanyl 25 mcg.  Additional Medications: Heparin 500 units.  ANESTHESIA/SEDATION: Moderate sedation time: 85 minutes  CONTRAST:  50 cc Visipaque 320  PROCEDURE: The procedure, risks, benefits, and alternatives were explained to the patient. Questions regarding the procedure were encouraged and answered. The patient understands and consents to the procedure.  The right groin was prepped with Betadine in a sterile fashion, and a sterile drape was applied covering the operative field. A sterile gown and sterile gloves were used for the procedure.  Contrast was injected into the multi side-hole catheter and a right lower extremity runoff was performed. This demonstrated resolution of the thrombus with residual narrowing at the distal graft to  tibioperoneal trunk anastomosis as well as a distal tibioperoneal trunk stenosis. The heparin was stopped during the procedure. After approximately 20 min, repeat angiogram demonstrates recurrent early occlusion at these areas of narrowing. 500 units of additional heparin was administered.  The infusion catheter was exchanged over a 300 cm stiff a 018 wire for a 4 mm x 2 cm balloon.  A glidewire was advanced through the balloon and across the 2 lesions into the peroneal artery. The glidewire was exchanged for the stiff 018 wire. The 2 lesions were dilated.  Post angioplasty imaging with angiography demonstrates a dissection in the tibioperoneal trunk narrowing location. This area was read dilated and held in place for 60 seconds. Repeat angiogram demonstrates wide patency  at the 2 areas of stenosis.  A full left lower extremity runoff demonstrates patency of the left common femoral artery, bypass graft, tibioperoneal trunk, and with 2 vessel runoff via the posterior tibial and peroneal arteries.  The sheath was removed and hemostasis was achieved with an exo seal device.  FINDINGS: Initial angiogram demonstrates resolution of the thrombus throughout the fem-pop venous bypass graft but 2 areas of narrowing distally, at the distal anastomosis, and within the tibioperoneal trunk.  After angioplasty, initial repeat imaging demonstrates a dissection at the tibioperoneal trunk stenosis.  After repeat angioplasty, the vessel was noted to be widely patent with some irregularity.  Left lower extremity runoff was performed demonstrating patency of the graft, distal anastomosis to the tibioperoneal trunk, and 2 vessel runoff via the posterior tibial artery and peroneal artery.  COMPLICATIONS: None  IMPRESSION: Successful lysis of a femoral popliteal bypass graft and successful angioplasty of 2 areas of infrapopliteal narrowing as described.   Electronically Signed   By: Maryclare Bean M.D.   On: 03/24/2014 12:07   Ir US Guide  Vasc Access Right  03/22/2014   CLINICAL DATA:  Thrombosed left femoral popliteal arterial saphenous vein bypass graft.  EXAM: IR INFUSION THROMBOL ARTERIAL INITIAL (MS); RIGHT EXTREMITY ARTERIOGRAPHY; IR ULTRASOUND GUIDANCE VASC ACCESS RIGHT  FLUOROSCOPY TIME:  20 min and 12 seconds.  MEDICATIONS AND MEDICAL HISTORY: Versed two mg, Fentanyl 100 mcg.  Additional Medications: Heparin 5000 units.  ANESTHESIA/SEDATION: Moderate sedation time: 60 minutes  CONTRAST:  150 cc Omnipaque 300  PROCEDURE: The procedure, risks, benefits, and alternatives were explained to the patient. Questions regarding the procedure were encouraged and answered. The patient understands and consents to the procedure.  The right groin was prepped with Betadine in a sterile fashion, and a sterile drape was applied covering the operative field. A sterile gown and sterile gloves were used for the procedure.  Under sonographic guidance, a micropuncture needle was inserted into the right common femoral artery and removed over a 018 wire, which was up sized to a Bentson. A 5 French sheath was inserted. A 4 French omni Flush catheter was advanced over the wire to the aorta. Pelvic angiography was performed.  The Omni Flush catheter was carefully advanced over a glidewire into the left common iliac artery and finally into the left external iliac artery. The catheter was removed over a Rosen wire. The 5 French sheath was then exchanged for a 35 cm 6 French sheath. This could be advanced to the upper right common iliac artery. It could not be advanced across the bifurcation secondary to significant plaque.  A total of 5000 units of heparin was injected peripherally.  A vertebral 5 French catheter was advanced over the [place] wire to the left external iliac artery and angiography of the left groin and proximal thigh were obtained.  The vertebral catheter was then advanced over a stiff glidewire into the thrombosed venous bypass graft, to the level of the  knee. The catheter was removed over a Rosen wire. A 135 cm 50 cm infusion length 5 French multi side hole catheter was then advanced over the [place] wire to the level of the knee. The catheter and sheath were secured in place. TNK at 0.5 milligrams/hour was instituted into the infusion catheter.  FINDINGS: Pelvic angiography demonstrates atherosclerotic plaque at the origin of the right and left common iliac arteries without narrowing greater than 50%. The left external iliac artery is patent.  The left common femoral artery is patent.  The left superficial femoral artery is severely diseased. The left femoral popliteal artery is occluded.  The next series of images demonstrate placement of an infusion catheter into the femoral popliteal artery bypass graft.  COMPLICATIONS: None  IMPRESSION: Successful placement of an infusion catheter into a thrombosed left femoral popliteal artery bypass graft. TNK was instituted at 0.5 milligrams/hour.   Electronically Signed   By: Maryclare Bean M.D.   On: 03/22/2014 14:49   Dg Chest Port 1 View  04/02/2014   CLINICAL DATA:  Shortness of breath  EXAM: PORTABLE CHEST - 1 VIEW  COMPARISON:  03/30/2014  FINDINGS: Cardiac shadow is stable. A right-sided PICC line is again seen and stable. Diffuse bilateral infiltrates are again noted and stable. No new focal abnormality is seen. No bony abnormality is noted.  IMPRESSION: No change from the prior exam.   Electronically Signed   By: Alcide Clever M.D.   On: 04/02/2014 08:04   Dg Chest Port 1 View  03/30/2014   CLINICAL DATA:  80 year old male with shortness of breath  EXAM: PORTABLE CHEST - 1 VIEW  COMPARISON:  03/28/2014 and prior chest radiographs dating back to 03/21/2014  FINDINGS: The cardiomediastinal silhouette is stable.  A right PICC line is again identified with tip overlying upper right atrium.  Diffuse bilateral airspace opacities are again noted and relatively unchanged.  There is no evidence of pneumothorax or definite  effusion.  IMPRESSION: Stable chest radiograph with diffuse bilateral airspace opacities.   Electronically Signed   By: Laveda Abbe M.D.   On: 03/30/2014 12:38   Dg Chest Port 1 View  03/28/2014   CLINICAL DATA:  Check endotracheal tube placement  EXAM: PORTABLE CHEST - 1 VIEW  COMPARISON:  03/27/2014  FINDINGS: A right-sided PICC line is again noted and stable. No endotracheal tube is identified at this time. Diffuse increased density is again noted in the right mid and upper lung as well as the left lower lung. The overall appearance is stable from the prior exam.  IMPRESSION: No significant change from the prior day. No endotracheal tube is noted.   Electronically Signed   By: Alcide Clever M.D.   On: 03/28/2014 07:49   Dg Chest Port 1 View  03/27/2014   CLINICAL DATA:  Lung infiltrates  EXAM: PORTABLE CHEST - 1 VIEW  COMPARISON:  Chest radiograph and chest CT March 25, 2014  FINDINGS: There is underlying emphysematous change. There is a degree of underlying interstitial fibrosis. Widespread interstitial and alveolar edema remain without appreciable change. Heart is mildly enlarged. The pulmonary vascularity is within normal limits. Central catheter tip is in the right atrium slightly distal to the cavoatrial junction, stable. No pneumothorax.  IMPRESSION: Suspect congestive heart failure superimposed on chronic interstitial fibrosis and emphysema. There may well be underlying infectious pneumonia as well. The appearance is stable compared to 2 days prior.   Electronically Signed   By: Bretta Bang M.D.   On: 03/27/2014 07:19   Dg Chest Port 1 View  03/25/2014   CLINICAL DATA:  Shortness of breath  EXAM: PORTABLE CHEST - 1 VIEW  COMPARISON:  03/24/2014  FINDINGS: Cardiac shadow is within normal limits. Diffuse bilateral infiltrates are again identified. A PICC line is now been placed on the right with the catheter tip at the cavoatrial junction. No pneumothorax is seen. No other focal abnormality is noted.   IMPRESSION: New PICC line in satisfactory position.  Bilateral parenchymal infiltrates stable from the previous day.  Electronically Signed   By: Alcide Clever M.D.   On: 03/25/2014 09:12   Dg Chest Port 1 View  03/24/2014   CLINICAL DATA:  Short of breath, coughing congestion  EXAM: PORTABLE CHEST - 1 VIEW  COMPARISON:  Prior chest x-ray 03/24/2014 at 3:28 a.m.  FINDINGS: Persistent diffuse bilateral predominantly interstitial opacities. There has been no significant interval change compared to the radiograph from earlier this morning. Cardiac and mediastinal contours remain unchanged. Atherosclerotic calcifications are present within the transverse aorta. Upper abdominal gas pattern is unremarkable. No acute osseous abnormality.  IMPRESSION: No significant interval change in the appearance of the lungs. Persistent diffuse bilateral interstitial opacities which may reflect pulmonary fibrosis, interstitial edema or atypical infection.   Electronically Signed   By: Malachy Moan M.D.   On: 03/24/2014 14:40   Dg Chest Port 1 View  03/24/2014   CLINICAL DATA:  Hemoptysis and wheezing.  EXAM: PORTABLE CHEST - 1 VIEW  COMPARISON:  03/21/2014  FINDINGS: Borderline heart size. Diffuse coarse interstitial infiltrates throughout both lungs likely representing diffuse interstitial fibrosis. No significant change since previous study, allowing for technical differences. No focal consolidation. No blunting of costophrenic angles. No pneumothorax.  IMPRESSION: Diffuse interstitial infiltrates in the lungs likely fibrosis. No change since prior study.   Electronically Signed   By: Burman Nieves M.D.   On: 03/24/2014 04:05   Ir Infusion Thrombol Arterial Initial (ms)  03/22/2014   CLINICAL DATA:  Thrombosed left femoral popliteal arterial saphenous vein bypass graft.  EXAM: IR INFUSION THROMBOL ARTERIAL INITIAL (MS); RIGHT EXTREMITY ARTERIOGRAPHY; IR ULTRASOUND GUIDANCE VASC ACCESS RIGHT  FLUOROSCOPY TIME:  20 min  and 12 seconds.  MEDICATIONS AND MEDICAL HISTORY: Versed two mg, Fentanyl 100 mcg.  Additional Medications: Heparin 5000 units.  ANESTHESIA/SEDATION: Moderate sedation time: 60 minutes  CONTRAST:  150 cc Omnipaque 300  PROCEDURE: The procedure, risks, benefits, and alternatives were explained to the patient. Questions regarding the procedure were encouraged and answered. The patient understands and consents to the procedure.  The right groin was prepped with Betadine in a sterile fashion, and a sterile drape was applied covering the operative field. A sterile gown and sterile gloves were used for the procedure.  Under sonographic guidance, a micropuncture needle was inserted into the right common femoral artery and removed over a 018 wire, which was up sized to a Bentson. A 5 French sheath was inserted. A 4 French omni Flush catheter was advanced over the wire to the aorta. Pelvic angiography was performed.  The Omni Flush catheter was carefully advanced over a glidewire into the left common iliac artery and finally into the left external iliac artery. The catheter was removed over a Rosen wire. The 5 French sheath was then exchanged for a 35 cm 6 French sheath. This could be advanced to the upper right common iliac artery. It could not be advanced across the bifurcation secondary to significant plaque.  A total of 5000 units of heparin was injected peripherally.  A vertebral 5 French catheter was advanced over the [place] wire to the left external iliac artery and angiography of the left groin and proximal thigh were obtained.  The vertebral catheter was then advanced over a stiff glidewire into the thrombosed venous bypass graft, to the level of the knee. The catheter was removed over a Rosen wire. A 135 cm 50 cm infusion length 5 French multi side hole catheter was then advanced over the [place] wire to the level of the knee. The  catheter and sheath were secured in place. TNK at 0.5 milligrams/hour was instituted  into the infusion catheter.  FINDINGS: Pelvic angiography demonstrates atherosclerotic plaque at the origin of the right and left common iliac arteries without narrowing greater than 50%. The left external iliac artery is patent.  The left common femoral artery is patent. The left superficial femoral artery is severely diseased. The left femoral popliteal artery is occluded.  The next series of images demonstrate placement of an infusion catheter into the femoral popliteal artery bypass graft.  COMPLICATIONS: None  IMPRESSION: Successful placement of an infusion catheter into a thrombosed left femoral popliteal artery bypass graft. TNK was instituted at 0.5 milligrams/hour.   Electronically Signed   By: Maryclare Bean M.D.   On: 03/22/2014 14:49   Ir Rande Lawman F/u Eval Art/ven Basil Dess Day (ms)  03/23/2014   CLINICAL DATA:  24 hr post left lower extremity arterial lysis check  EXAM: IR THROMB F/U EVAL ART/VEN SUBSEQ DAY (MS)  FLUOROSCOPY TIME:  30 seconds.  MEDICATIONS AND MEDICAL HISTORY: None  ANESTHESIA/SEDATION: None  CONTRAST:  20 cc Omnipaque 350  PROCEDURE: The procedure, risks, benefits, and alternatives were explained to the patient. Questions regarding the procedure were encouraged and answered. The patient understands and consents to the procedure.  Contrast was injected into the infusion catheter and imaging was obtained.  FINDINGS: The left common femoral artery remains patent.  The saphenous vein graft is patent to the level of the knee. It is occluded at the knee. The infusion catheter remains in place. Runoff tibial vessels were not seen to opacified.  COMPLICATIONS: None  IMPRESSION: There is improvement with near complete lysed cysts of the saphenous vein graft. There continues to be occlusive thrombus distally. The plan is for an additional 24 hr of lysis with a follow-up check tomorrow morning.   Electronically Signed   By: Maryclare Bean M.D.   On: 03/23/2014 09:12   Ir Rande Lawman F/u Eval Art/ven Final Day  (ms)  03/24/2014   CLINICAL DATA:  48 hr post left lower extremity fem-pop graft lysis.  EXAM: IR THROMB F/U EVAL ART/VEN FINAL DAY; IR FEM POP ART PTA  FLUOROSCOPY TIME:  17 min and 21 seconds.  MEDICATIONS AND MEDICAL HISTORY: Versed 0.5 mg, Fentanyl 25 mcg.  Additional Medications: Heparin 500 units.  ANESTHESIA/SEDATION: Moderate sedation time: 85 minutes  CONTRAST:  50 cc Visipaque 320  PROCEDURE: The procedure, risks, benefits, and alternatives were explained to the patient. Questions regarding the procedure were encouraged and answered. The patient understands and consents to the procedure.  The right groin was prepped with Betadine in a sterile fashion, and a sterile drape was applied covering the operative field. A sterile gown and sterile gloves were used for the procedure.  Contrast was injected into the multi side-hole catheter and a right lower extremity runoff was performed. This demonstrated resolution of the thrombus with residual narrowing at the distal graft to tibioperoneal trunk anastomosis as well as a distal tibioperoneal trunk stenosis. The heparin was stopped during the procedure. After approximately 20 min, repeat angiogram demonstrates recurrent early occlusion at these areas of narrowing. 500 units of additional heparin was administered.  The infusion catheter was exchanged over a 300 cm stiff a 018 wire for a 4 mm x 2 cm balloon.  A glidewire was advanced through the balloon and across the 2 lesions into the peroneal artery. The glidewire was exchanged for the stiff 018 wire. The 2 lesions were dilated.  Post angioplasty  imaging with angiography demonstrates a dissection in the tibioperoneal trunk narrowing location. This area was read dilated and held in place for 60 seconds. Repeat angiogram demonstrates wide patency at the 2 areas of stenosis.  A full left lower extremity runoff demonstrates patency of the left common femoral artery, bypass graft, tibioperoneal trunk, and with 2 vessel  runoff via the posterior tibial and peroneal arteries.  The sheath was removed and hemostasis was achieved with an exo seal device.  FINDINGS: Initial angiogram demonstrates resolution of the thrombus throughout the fem-pop venous bypass graft but 2 areas of narrowing distally, at the distal anastomosis, and within the tibioperoneal trunk.  After angioplasty, initial repeat imaging demonstrates a dissection at the tibioperoneal trunk stenosis.  After repeat angioplasty, the vessel was noted to be widely patent with some irregularity.  Left lower extremity runoff was performed demonstrating patency of the graft, distal anastomosis to the tibioperoneal trunk, and 2 vessel runoff via the posterior tibial artery and peroneal artery.  COMPLICATIONS: None  IMPRESSION: Successful lysis of a femoral popliteal bypass graft and successful angioplasty of 2 areas of infrapopliteal narrowing as described.   Electronically Signed   By: Maryclare Bean M.D.   On: 03/24/2014 12:07    ASSESSMENT AND PLAN  1. Acute respiratory failure  - h/o contrast allergy s/p angiography and TNK for lysis of distal clot of LE bypass graft 7/31. Started to have  significant dyspnea on 8/2,  - per [facility] IPF flare following angiography  - increasing O2 requirement  - WBC trending up, some chill no fever, unclear if related to steroid, restarting abx - currently on low dose coreg, would prefer to continue this med, however may discontinue if respiratory condition worsen to avoid bronchospasm  2. NSTEMI with anterior akinesis on echo  - in the setting of severe anemia, trop 0.49 --> 0.41 --> 0.42  - will need pretreatment prior to cath for allergy to omnipaque given recent IPF flare after angiography, does have h/o allergy of prednisone, however tolerating benadryl with solu-medrol. If plan for cath, will need solumedrol, benadryl and pepcid on board.  - unable to do L/RHC at this time given severe dyspnea and high probability of intubation  after cath  3. Acute systolic heart failure with EF 30-35%  - ?underlying ischemia vs takotsubo's, given LBBB, catheterization is recommended, but not safe to do that  from a respiratory standpoint.  - continue lasix, Cr continue to improve  4. Anemia  5. Acute renal insufficiency - creatinine is slowly improving  6. LBBB  Disposition: PCCC and family plan for family meeting around 10:00am on 8/12. Daughter was tearful today and anxious that her father condition has not improved since moving out of ICU. Had a long discussion with daughter. Daughter wish cardiology to attend family meeting as well tomorrow. Will discuss with Dr. Elease Hashimoto  Signed, Azalee Course PA-C Pager: [phone]   Attending Note:   The patient was seen and examined.  Agree with assessment and plan as noted above.  Changes made to the above note as needed.  He has not put out as much urine over the past several days.  Will change his lasix back to IV . The CXR has not been done today.  The CXR from 8/8 showed significant pulmonary injury - ? ARDS pattern.    No additional recs.    I will try to be available for team meeting but I'm not sure that I can. For now, his therapy is fairly  standard - IV lasix, BP control  I'm concerned about doing a cath because of this contrast medicated pulmonary injury issue.  He needs to be much stronger before we even consider cath ( or else he runs the risk of respiratory failure and intubation)   Vesta Mixer, Montez Hageman., MD, [facility] 04/02/2014, 12:46 PM [address],  [address] Office [phone] Pager [phone]

## 2014-04-02 NOTE — Progress Notes (Signed)
ANTIBIOTIC AND ANTICOAGULATION CONSULT NOTE - Follow-up  Pharmacy Consult for vancomycin and cefepime; heparin Indication: HCAP; NSTEMI; s/p aortogram and thrombolysis of LLE bypass    Allergies  Allergen Reactions  . Prednisone Hives  . Omnipaque [Iohexol] Itching and Rash    Patient Measurements: Height: 5\' 5"  (165.1 cm) Weight: 127 lb 13.9 oz (58 kg) IBW/kg (Calculated) : 61.5  Vital Signs: Temp: 97.9 F (36.6 C) (08/11 0729) Temp src: Oral (08/11 0729) BP: 144/61 mmHg (08/11 0729) Pulse Rate: 89 (08/11 0729) Intake/Output from previous day: 08/10 0701 - 08/11 0700 In: 682.5 [P.O.:480; I.V.:202.5] Out: 625 [Urine:625] Intake/Output from this shift: Total I/O In: 8 [I.V.:8] Out: -   Labs:  Recent Labs  03/31/14 0255 04/01/14 0725 04/02/14 0235  WBC 17.1* 17.2* 19.1*  HGB 9.3* 9.1* 9.1*  PLT 273 295 310  CREATININE 1.26 1.11 1.07   Estimated Creatinine Clearance: 46.7 ml/min (by C-G formula based on Cr of 1.07). No results found for this basename: VANCOTROUGH, Leodis BinetVANCOPEAK, VANCORANDOM, GENTTROUGH, GENTPEAK, GENTRANDOM, TOBRATROUGH, TOBRAPEAK, TOBRARND, AMIKACINPEAK, AMIKACINTROU, AMIKACIN,  in the last 72 hours   Microbiology: Recent Results (from the past 720 hour(s))  MRSA PCR SCREENING     Status: None   Collection Time    03/22/14  9:35 PM      Result Value Ref Range Status   MRSA by PCR NEGATIVE  NEGATIVE Final   Comment:            The GeneXpert MRSA Assay (FDA     approved for NASAL specimens     only), is one component of a     comprehensive MRSA colonization     surveillance program. It is not     intended to diagnose MRSA     infection nor to guide or     monitor treatment for     MRSA infections.    Medications:  Scheduled:  . sodium chloride   Intravenous Once  . aspirin EC  325 mg Oral Daily  . atorvastatin  80 mg Oral q1800  . carvedilol  3.125 mg Oral BID WC  . ceFEPime (MAXIPIME) IV  1 g Intravenous Q24H  . feeding supplement  (GLUCERNA SHAKE)  237 mL Oral TID BM  . furosemide  40 mg Intravenous Once  . furosemide  40 mg Oral Daily  . insulin aspart  0-15 Units Subcutaneous 6 times per day  . insulin glargine  10 Units Subcutaneous Daily  . lisinopril  20 mg Oral Daily  . methylPREDNISolone (SOLU-MEDROL) injection  40 mg Intravenous Q12H  . multivitamin with minerals  1 tablet Oral Daily  . pantoprazole  40 mg Oral BID AC  . sodium chloride  10-40 mL Intracatheter Q12H  . sodium chloride  3 mL Intravenous Q12H  . vancomycin  500 mg Intravenous Q12H  . vancomycin  1,000 mg Intravenous NOW   Infusions:  . heparin 800 Units/hr (04/02/14 0800)   Assessment: 80 y/o male with acute occlusion of left femoral-below-knee popliteal bypass s/p lysis with TNKase (7/31-8/2). He had episodes of intermittent hemoptysis during and after lytic therapy; heparin drip was stopped on 8/3. Pharmacy consulted on 8/5 to resume for NSTEMI with no bolus. Targeting low end of therapeutic range (0.3-0.5) to try minimize/avoid bleeding. Cardiology planning for cardiac cath one pulmonary function improves. Heparin level is therapeutic on the low end of normal on 800 units/hr. Previous heparin levels have been above goal on 850 units/hr so will keep current rate. H/H low stable,  platelets are normal. No bleeding noted.  Pharmacy consulted to resume broad spectrum antibiotics, vancomycin and cefepime, for HCAP coverage. CXR shows bilateral infiltrates which are stable. He is afebrile, WBC are elevated (on steroids), renal function is stable, new culture data pending. Last vancomycin trough was therapeutic on 500 mg q12h.  Ancef x 1 on 7/31 Vanc 8/3>>8/6; 8/11>> Zosyn 8/3>>8/9 Levaquin 8/3>>8/4 Cefepime 8/11>>  Goal of Therapy:  Vancomycin trough level 15-20 mcg/ml Heparin level 0.3-0.5 units/ml with previous hemoptysis  Monitor platelets by anticoagulation protocol: Yes   Plan:  - Continue heparin drip at 800 units/hr - Daily heparin  level and CBC - Monitor for s/sx of bleeding  - Vancomycin 1000 mg IV now then 500 mg IV q12h - Cefepime 1 g IV q24h - Monitor renal function, clinical progress, and culture data  [facility], [place].D., BCPS Clinical Pharmacist Pager: [phone] 04/02/2014 9:16 AM

## 2014-04-02 NOTE — Progress Notes (Signed)
PULMONARY / CRITICAL CARE MEDICINE  Name: Trevor Morris MRN: [id number] DOB: December 04, 1933    ADMISSION DATE:  03/21/2014 CONSULTATION DATE: Lillia Mountain  REFERRING MD :  Darrick Penna   CHIEF COMPLAINT:  Acute hypoxic respiratory failure   INITIAL PRESENTATION:  80 year old male w/ known h/o CM allergy. Underwent angiography and TNK administration for lysis of distal clot at his by-pass graft site on 7/31. His post-op course was complicated by episodes of scant hemoptysis. First noted 8/1 and continued so TNK was eventually stopped. He went to arteriogram 3 times total between 7/31 and 8/2. On 8/2 he began to have significant increase in dyspnea and by the time he returned to the SICU after angiogram required 100% NRB. He received lasix and supportive care. PCCM asked to see 8/3 as symptoms had continued to worsen.    reports that he quit smoking about 26 years ago.    LINES/TUBES: PICC 8/3>>   SIGNIFICANT EVENTS: 7/30 admitted for clotted fem-pop bypass graft.   7/31: went to IR for angiogram and TNK.   - 7/31: arteriogram: Pelvic angiography demonstrates atherosclerotic plaque at the origin of the right and left common iliac arteries without narrowing greater than 50%. The left external iliac artery is patent. Successful placement of an infusion catheter into a thrombosed left femoral popliteal artery bypass graft. TNK was instituted at 0.5 milligrams/hour  8/1, 230 p: TNK stopped due to scant hemoptysis. Resumed at 5p. 1/2 dose   - 8/1: arteriogram: improvement with near complete lysed cysts of the saphenous vein graft. There continues to be occlusive thrombus distally. The plan is for an additional 24 hr of lysis with a follow-up check 8/2  - 8/1 630: hemoptysis returned and was not resumed. Heparin continued.  8/2: increased shortness of breath.   - 8/2 third trip to angiogram: demonstrates resolution of the thrombus throughout the fem-pop venous bypass graft but 2 areas of narrowing distally,  at the distal anastomosis, and within the tibioperoneal trunk.   - 8/2: placed on 100% NRB after returning from angiogram.   8/3 PCCM called. Still very hypoxic on 100% NRB. Had episode where sats dropped to 70s. Heparin stopped due to some episode of hemoptysis.   - 8/3 CT chest: 1. Extensive bilateral acute interstitial lung disease superimposed on marked changes of COPD. (pulmonary edema vs viral pneumonitis). Small right pleural effusion. Mild mediastinal adenopathy. Dense coronary artery atheromatous calcifications. There is also diffuse distal esophageal wall thickening. This could be due to incomplete distention, esophagitis or mass. These possibilities could be differentiated with direct endoscopic visualization  8/4 diuresis, cards consulted,  - 8/4 echo> LVEF 30-35%, akinesis of anterior wall, grade 2 diastolic dysfunction, PA pressure , RV mildly reduced function    8/10 remains on NRB with 70% fio2   SUBJECTIVE/OVERNIGHT/INTERVAL HX Not really better at this point , still requires high flow O2 and desaturates with any activity.     VITAL SIGNS: Temp:  [97.5 F (36.4 C)-98.6 F (37 C)] 97.9 F (36.6 C) (08/11 0729) Pulse Rate:  [74-89] 89 (08/11 0729) Resp:  [20-35] 32 (08/11 0729) BP: (104-153)/(34-61) 144/61 mmHg (08/11 0729) SpO2:  [83 %-100 %] 94 % (08/11 0729) FiO2 (%):  [15 %] 15 % (08/10 1100)  FIO2@  NRM with no flap on  HEMODYNAMICS: CVP:  [2 mmHg-4 mmHg] 2 mmHg VENTILATOR SETTINGS: Vent Mode:  [-]  FiO2 (%):  [15 %] 15 % INTAKE / OUTPUT:  Intake/Output Summary (Last 24 hours) at 04/02/14 0830  Last data filed at 04/02/14 0800  Gross per 24 hour  Intake    682 ml  Output    625 ml  Net     57 ml    PHYSICAL EXAMINATION: General:  Elderly white male, sitting up in bed uncomfortable Neuro:  Awake, oriented, MAEW HEENT: NCAT, EOMs intact, no JVD Cardiovascular: Tachy, regular, no MGR Lungs:  Diminished bs bases Abdomen:  Soft, no tenderness to  palpation Musculoskeletal:  Left leg colder than right leg but no complaints. Skin:  Intact  Ext: No edema  LABS:  PULMONARY No results found for this basename: PHART, PCO2, PCO2ART, PO2, PO2ART, HCO3, TCO2, O2SAT,  in the last 168 hours  CBC  Recent Labs Lab 03/31/14 0255 04/01/14 0725 04/02/14 0235  HGB 9.3* 9.1* 9.1*  HCT 27.1* 26.6* 26.7*  WBC 17.1* 17.2* 19.1*  PLT 273 295 310    COAGULATION No results found for this basename: INR,  in the last 168 hours  CARDIAC   Recent Labs Lab 03/26/14 1315 03/26/14 1613 03/26/14 1913 03/30/14 1100  TROPONINI 0.49* 0.41* 0.42* 0.34*    Recent Labs Lab 03/29/14 0500 03/30/14 0420  PROBNP 3393.0* 2615.0*     CHEMISTRY  Recent Labs Lab 03/29/14 0500 03/30/14 0420 03/31/14 0255 04/01/14 0725 04/02/14 0235  NA 139 135* 132* 138 133*  K 4.4 4.2 4.2 4.2 4.6  CL 99 96 91* 95* 92*  CO2 30 28 30  34* 31  GLUCOSE 76 225* 178* 123* 160*  BUN 42* 44* 42* 38* 41*  CREATININE 1.40* 1.31 1.26 1.11 1.07  CALCIUM 8.4 8.4 8.4 8.7 8.7  MG 2.1 2.1 2.0 2.0 2.2  PHOS 3.3 2.8 3.0 3.1 3.3   Estimated Creatinine Clearance: 46.7 ml/min (by C-G formula based on Cr of 1.07).   LIVER No results found for this basename: AST, ALT, ALKPHOS, BILITOT, PROT, ALBUMIN, INR,  in the last 168 hours   INFECTIOUS  Recent Labs Lab 03/27/14 0500  PROCALCITON 0.71     ENDOCRINE CBG (last 3)   Recent Labs  04/01/14 2314 04/02/14 0359 04/02/14 0725  GLUCAP 232* 165* 152*   IMAGING x48h  Dg Chest Port 1 View  04/02/2014   CLINICAL DATA:  Shortness of breath  EXAM: PORTABLE CHEST - 1 VIEW  COMPARISON:  03/30/2014  FINDINGS: Cardiac shadow is stable. A right-sided PICC line is again seen and stable. Diffuse bilateral infiltrates are again noted and stable. No new focal abnormality is seen. No bony abnormality is noted.  IMPRESSION: No change from the prior exam.   Electronically Signed   By: Alcide Clever M.D.   On: 04/02/2014 08:04    ASSESSMENT / PLAN: PULMONOLOGY: A: Baseline June 2015 CT abd done for weight loss - does show undiagnosed ILD findings   - likely UIP/IPF based on age and basal predominance though there is R > L assyemtry  Currently: Acute hypoxemic respiratory failure developed 48h post admission   - DDx : IPF flare following angiography (possible diagnosis, some 5%-10% of IPF diagnosed like this) with Acute systolic CHF/pulm edema, alveolar hemorrhage (received TNK). Other resp etiologies include Alveolar Hge (had hemoptysis while on lytics), BOOP,  ALI/ARDS. Virual exacerbation     -On 03/29/14: much improved and down to [facility] after IV steroids and daily lasix > worse 03/30/14               -On 8-10 on NRB 70% with desaturation with any stress. P: - Await full autoimmune profile 03/28/14 (  so far negative). - Respiratory viral panel to be resent. - Continue  IV steroids (on them since 03/28/14); trial without benadryl. Likely will need po medrol to go home with  (note he gets hives with prednisone) - Continue lasix as PO. - Supplemental oxygen, Goal SpO2>=88 - ABX broad spectrum ( see infectious) - Bronchodilators PRN  CARDIOLOGY: A: NSTEMI,  This admit with Acute CHF exac, LVEF 30-35%, anterior akinesis,   P:  Heparin,IV gtt per cards and ASA per cards   INFECTIOUS: A: HCAP? P: Levaquin 8/3>> 8/5 Zosyn 8/3 >> 8/9  Vanco 8/3 >>   8/6 - Repeat cultures today. - Restart broad spectrum abx with cefepime and vanc.  HEMATOLOGY: A: Anemia, acute on chronic disease( baseline 10.3) [facility]? And critical illness  - No clear gi loss, esophageal lesion on CT - since ? IHD ideally and poor oxygenation need to keep hgb over 9 ideally - transfused one unit am 8/8 but did not get 1 gm increase so still may be low grade blood loss on hep but not obvious   RENAL: A: AKI > pre-renal? Contrast? Elevated BUN from steroids?    Lab Results  Component Value Date   CREATININE 1.07 04/02/2014    CREATININE 1.11 04/01/2014   CREATININE 1.26 03/31/2014     P: - Diuresis as tol   GI: A: Distal esophageal wall thickening- ??incomplete distension, esophagitis, mass (Scl-70 is negative this admit)  P: - Consulted GI for possible endoscopy after oxygenation improves - Hemoccult blood   - Advance diet - Stool softeners - Added high dose ppi 03/30/14   ENDOCRINE: A: Hx DM P: - SSI   NEURO A: intact but has some pain nos P Monitor Oxycodone prn  Left lower ext colder, will order an arterial doppler today.  GLOBAL: 03/27/14: Discussed with son / patient >>> they are agreeable to short term ventilatory support if required. Would not be able to tolerate FOB unless intubated - would defer for now  03/28/14: D/w son and daughter. Try empiric steroids.    03/29/14: improved. Updated patient and daughter and d/w Dr Hart RochesterLawson. Move to SDU. PCCM primary. Continue steroids and ACS.CHF Rx. PCCM followup opd needed. Get PT  03/31/14 discussed with pt/ son very limited options at this point   8/10 for possible LHC per cards, doubt currently he could tolerate cath without intubation.  Alyson ReedyWesam G. Yacoub, M.D. [facility]monary[facility]. Pager: [phone]. After hours pager: [phone].  04/02/2014 8:30 AM

## 2014-04-02 NOTE — Progress Notes (Signed)
VASCULAR LAB PRELIMINARY  ARTERIAL  ABI completed:    RIGHT    LEFT    PRESSURE WAVEFORM  PRESSURE WAVEFORM  BRACHIAL PICC line restricted limb  BRACHIAL 146 Triphasic  DP 73 Monophasic DP 126 Sharp Monophasic to Biphasic  PT 89 Monophasic PT 136 Biphasic    RIGHT LEFT  ABI 0.61 0.93   Right ABI indicates a moderate reduction in arterial flow. Left ABI indicates a normal arterial flow.   Anjelita Sheahan, RVS 04/02/2014, 9:27 AM

## 2014-04-02 NOTE — Progress Notes (Signed)
Inpatient Diabetes Program Recommendations  AACE/ADA: New Consensus Statement on Inpatient Glycemic Control (2013)  Target Ranges:  Prepandial:   less than 140 mg/dL      Peak postprandial:   less than 180 mg/dL (1-2 hours)      Critically ill patients:  140 - 180 mg/dL   Reason for Assessment: Persistent hyperglycemia  Diabetes history: Type 2 Outpatient Diabetes medications: Glucophage 1000 mg bid Current orders for Inpatient glycemic control:   Results for Trevor Morris, Trevor Morris (MRN [id number]) as of 04/02/2014 14:18  Ref. Range 04/01/2014 07:26 04/01/2014 11:38 04/01/2014 15:19 04/01/2014 19:52 04/01/2014 23:14 04/02/2014 03:59 04/02/2014 07:25 04/02/2014 11:33  Glucose-Capillary Latest Range: 70-99 mg/dL 165 (H) 790 (H) 383 (H) 190 (H) 232 (H) 165 (H) 152 (H) 234 (H)   Note: Patient receiving Solu Medrol 40 mg q 12 hours.  Ate 50% of meal today.  CBG pattern indicates need for regularly scheduled Novolog as meal coverage.  Request MD consider ordering the following:  Novolog 3 or 4 units tid as meal coverage (in addition to correction) as long as patient eats at least 50% and CBG is at least 80 mg/dl Thank you.  Carri Spillers S. Elsie Lincoln, RN, CNS, CDE Inpatient Diabetes Program, team pager [phone]

## 2014-04-02 NOTE — Progress Notes (Signed)
eLink Physician-Brief Progress Note Patient Name: Trevor Morris DOB: 1933-11-05 MRN: [id number]  Date of Service  04/02/2014   HPI/Events of Note   Pt feeling anxious.  eICU Interventions  Will order prn xanax.  Will also adjust morphine dose.   Intervention Category Major Interventions: Other:  Ronia Hazelett 04/02/2014, 4:43 PM

## 2014-04-02 NOTE — Progress Notes (Signed)
Patient ID: Trevor Morris, male   DOB: 1934-06-29, 80 y.o.   MRN: [id number]      Patient: Trevor Morris / Admit Date: 03/21/2014 / Date of Encounter: 04/02/2014, 11:10 AM  Patient seen by Azalee Course, PA-C   Eula Listen, PA-C 04/02/2014 11:30 AM

## 2014-04-03 ENCOUNTER — Inpatient Hospital Stay (HOSPITAL_COMMUNITY): Payer: Medicare Other

## 2014-04-03 ENCOUNTER — Encounter (HOSPITAL_COMMUNITY): Payer: Self-pay | Admitting: Internal Medicine

## 2014-04-03 DIAGNOSIS — Z66 Do not resuscitate: Secondary | ICD-10-CM | POA: Diagnosis present

## 2014-04-03 DIAGNOSIS — J84112 Idiopathic pulmonary fibrosis: Secondary | ICD-10-CM | POA: Diagnosis present

## 2014-04-03 DIAGNOSIS — Z515 Encounter for palliative care: Secondary | ICD-10-CM

## 2014-04-03 DIAGNOSIS — K229 Disease of esophagus, unspecified: Secondary | ICD-10-CM | POA: Diagnosis present

## 2014-04-03 LAB — CBC
HEMATOCRIT: 24.1 % — AB (ref 39.0–52.0)
HEMOGLOBIN: 8.2 g/dL — AB (ref 13.0–17.0)
MCH: 30.3 pg (ref 26.0–34.0)
MCHC: 34 g/dL (ref 30.0–36.0)
MCV: 88.9 fL (ref 78.0–100.0)
Platelets: 283 10*3/uL (ref 150–400)
RBC: 2.71 MIL/uL — AB (ref 4.22–5.81)
RDW: 15.3 % (ref 11.5–15.5)
WBC: 22.2 10*3/uL — AB (ref 4.0–10.5)

## 2014-04-03 LAB — RESPIRATORY VIRUS PANEL
Adenovirus: NOT DETECTED
INFLUENZA A H3: NOT DETECTED
INFLUENZA B 1: NOT DETECTED
Influenza A H1: NOT DETECTED
Influenza A: NOT DETECTED
Metapneumovirus: NOT DETECTED
PARAINFLUENZA 1 A: NOT DETECTED
PARAINFLUENZA 2 A: NOT DETECTED
Parainfluenza 3: NOT DETECTED
RESPIRATORY SYNCYTIAL VIRUS A: NOT DETECTED
RESPIRATORY SYNCYTIAL VIRUS B: NOT DETECTED
Rhinovirus: NOT DETECTED

## 2014-04-03 LAB — BASIC METABOLIC PANEL
Anion gap: 12 (ref 5–15)
BUN: 42 mg/dL — AB (ref 6–23)
CHLORIDE: 91 meq/L — AB (ref 96–112)
CO2: 29 meq/L (ref 19–32)
CREATININE: 1.11 mg/dL (ref 0.50–1.35)
Calcium: 8.5 mg/dL (ref 8.4–10.5)
GFR calc Af Amer: 71 mL/min — ABNORMAL LOW (ref >90)
GFR calc non Af Amer: 62 mL/min — ABNORMAL LOW (ref >90)
GLUCOSE: 274 mg/dL — AB (ref 70–99)
POTASSIUM: 4.8 meq/L (ref 3.7–5.3)
Sodium: 132 mEq/L — ABNORMAL LOW (ref 137–147)

## 2014-04-03 LAB — GLUCOSE, CAPILLARY
GLUCOSE-CAPILLARY: 212 mg/dL — AB (ref 70–99)
Glucose-Capillary: 229 mg/dL — ABNORMAL HIGH (ref 70–99)
Glucose-Capillary: 214 mg/dL — ABNORMAL HIGH (ref 70–99)
Glucose-Capillary: 181 mg/dL — ABNORMAL HIGH (ref 70–99)
Glucose-Capillary: 97 mg/dL (ref 70–99)

## 2014-04-03 LAB — PHOSPHORUS: Phosphorus: 4.5 mg/dL (ref 2.3–4.6)

## 2014-04-03 LAB — HEPARIN LEVEL (UNFRACTIONATED): Heparin Unfractionated: 0.11 IU/mL — ABNORMAL LOW (ref 0.30–0.70)

## 2014-04-03 LAB — MAGNESIUM: Magnesium: 2.2 mg/dL (ref 1.5–2.5)

## 2014-04-03 MED ORDER — ALBUTEROL SULFATE (2.5 MG/3ML) 0.083% IN NEBU
2.5000 mg | INHALATION_SOLUTION | RESPIRATORY_TRACT | Status: DC | PRN
  Filled 2014-04-03: qty 3

## 2014-04-03 MED ORDER — TAMSULOSIN HCL 0.4 MG PO CAPS
0.4000 mg | ORAL_CAPSULE | Freq: Every day | ORAL | Status: DC
  Administered 2014-04-03 – 2014-04-04 (×2): 0.4 mg via ORAL
  Filled 2014-04-03 (×3): qty 1

## 2014-04-03 MED ORDER — FUROSEMIDE 10 MG/ML IJ SOLN
40.0000 mg | Freq: Four times a day (QID) | INTRAMUSCULAR | Status: AC
Start: 2014-04-03 — End: 2014-04-04
  Administered 2014-04-03 – 2014-04-04 (×3): 40 mg via INTRAVENOUS
  Filled 2014-04-03 (×3): qty 4

## 2014-04-03 MED ORDER — METHYLPREDNISOLONE SODIUM SUCC 125 MG IJ SOLR
80.0000 mg | Freq: Four times a day (QID) | INTRAMUSCULAR | Status: DC
  Administered 2014-04-03 – 2014-04-05 (×7): 80 mg via INTRAVENOUS
  Filled 2014-04-03 (×5): qty 1.28
  Filled 2014-04-03: qty 2
  Filled 2014-04-03 (×7): qty 1.28

## 2014-04-03 MED ORDER — IPRATROPIUM-ALBUTEROL 0.5-2.5 (3) MG/3ML IN SOLN
3.0000 mL | RESPIRATORY_TRACT | Status: DC
  Administered 2014-04-03: 3 mL via RESPIRATORY_TRACT
  Filled 2014-04-03 (×2): qty 3

## 2014-04-03 MED ORDER — MORPHINE SULFATE 2 MG/ML IJ SOLN
2.0000 mg | INTRAMUSCULAR | Status: DC | PRN
  Administered 2014-04-05 (×2): 2 mg via INTRAVENOUS
  Filled 2014-04-03 (×2): qty 1

## 2014-04-03 MED ORDER — PANTOPRAZOLE SODIUM 40 MG IV SOLR
40.0000 mg | Freq: Two times a day (BID) | INTRAVENOUS | Status: DC
  Administered 2014-04-03 – 2014-04-04 (×3): 40 mg via INTRAVENOUS
  Filled 2014-04-03 (×6): qty 40

## 2014-04-03 MED ORDER — MORPHINE SULFATE (CONCENTRATE) 10 MG /0.5 ML PO SOLN
5.0000 mg | Freq: Four times a day (QID) | ORAL | Status: DC
  Administered 2014-04-03 – 2014-04-05 (×7): 5 mg via ORAL
  Filled 2014-04-03 (×7): qty 0.5

## 2014-04-03 MED ORDER — SENNOSIDES-DOCUSATE SODIUM 8.6-50 MG PO TABS
1.0000 | ORAL_TABLET | Freq: Every day | ORAL | Status: DC
  Filled 2014-04-03 (×3): qty 1

## 2014-04-03 NOTE — Progress Notes (Signed)
0330 Pt was found on Floor, diaphoretic and confused . Pt assessed and moved back to bed. Pt placed back on monitor and non- rebreather. Pt is confused but responsive.  0335 Pt is responding to his name and is oriented X2  Pox 82%. CCM paged and responded. Pt assessed by CCM Rahul Desai PA.c. Orders given.   0420 Pt went down for CT of the Head accompanied by RN and Engineer, manufacturing systems.  0430 Pt stated that he remembered trying to stand and pee and slid off the bed. Pt stated that he has no pain, No headache. Will continue to monitor [phone] Called and left message for the daughter to return phone call, to notify of the event. Pt is AAOX4 Neuro is at pt base line. Will continue to monitor.

## 2014-04-03 NOTE — Consult Note (Signed)
Palliative Medicine Team at [facility]  Date: 04/03/2014   Patient Name: Trevor Morris  DOB: 04/30/1934  MRN: [id number]  Age / Sex: 80 y.o., male   PCP: Lucianne LeiNina Uppin, MD Referring Physician: Kalman ShanMurali Ramaswamy, MD  Active Problems: Principal Problem:   IPF (idiopathic pulmonary fibrosis) Active Problems:   PAD (peripheral artery disease)   Occlusion of left femoropopliteal bypass graft   Acute respiratory failure with hypoxia   NSTEMI (non-ST elevated myocardial infarction)   COPD (chronic obstructive pulmonary disease)   Acute combined systolic and diastolic heart failure   Hypokalemia   DNR (do not resuscitate)   Esophageal abnormality   HPI/Reason for Consultation: Trevor Morris is a 80 year old [place] who has until this admission been living at home independently taking care of his wife who has dementia. He reports seeing doctors only when necessary and his PMH is mostly related to PVD in his lower extremities-he had a FEM POP bypass ten years ago and had seen [facility] for follow up. Over the past 6 months he has had marked weight loss and reduced exercise tolerance and very poor appetite-his daughetr reports that for years he has always choled on his food- she says for the past few months they couldn't even go out to eat because he would gag and regurgitate his food. He apparently had a work up in [place] for this, no records available, but daughter tells me it was normal. The reason he came into teh hospital was on 7/30 was because his foot became acutely blue/black toe and he had planned procedure- however with the thrombolytics he subsequently developed hemoptysis and they were discontinued. On CT he has severe esophageal thickening and he has now been diagnosed with IPF and superimposed pulmonary edema.   He is hypoxic on NRB in step down unit-now day 13 of his hospitalization with another setback related to his lungs- his condition is felt to be progressive and  irreversible per pulmonary team. Patient and family stating that at this point he wants to go home "if its my time to die I'd rather be sitting on my porch and holding my wife's hand" "I do not want to die in the hospital".  Participants in Discussion: HCPOA: yes   Advance Directive:    Code Status Orders        Start     Ordered   04/03/14 1211  Do not attempt resuscitation (DNR)   Continuous    Question Answer Comment  Maintain current active treatments Yes   Do not initiate new interventions Yes      04/03/14 1210       @ADVDIR @  I have reviewed the medical record, interviewed the patient and family, and examined the patient. The following aspects are pertinent.  Past Medical History  Diagnosis Date  . Diabetes mellitus   . Hyperlipidemia   . Hypertension   . Anemia   . COPD (chronic obstructive pulmonary disease)   . Peripheral vascular disease   . Peripheral vascular disease, unspecified 11/14/2012   History   Social History  . Marital Status: Married    Spouse Name: N/A    Number of Children: N/A  . Years of Education: N/A   Social History Main Topics  . Smoking status: Former Smoker    Types: Cigarettes    Quit date: 08/24/1987  . Smokeless tobacco: Never Used  . Alcohol Use: No  . Drug Use: No  . Sexual Activity: None  Other Topics Concern  . None   Social History Narrative  . None   Family History  Problem Relation Age of Onset  . Diabetes Other   . Diabetes Father   . Hypertension Father    Scheduled Meds: . sodium chloride   Intravenous Once  . aspirin EC  325 mg Oral Daily  . atorvastatin  80 mg Oral q1800  . carvedilol  3.125 mg Oral BID WC  . ceFEPime (MAXIPIME) IV  1 g Intravenous Q24H  . feeding supplement (GLUCERNA SHAKE)  237 mL Oral TID BM  . furosemide  40 mg Intravenous Q6H  . insulin aspart  0-15 Units Subcutaneous 6 times per day  . insulin glargine  10 Units Subcutaneous Daily  . ipratropium-albuterol  3 mL  Nebulization Q4H  . lisinopril  20 mg Oral Daily  . methylPREDNISolone (SOLU-MEDROL) injection  80 mg Intravenous Q6H  . morphine CONCENTRATE  5 mg Oral 4 times per day  . pantoprazole (PROTONIX) IV  40 mg Intravenous Q12H  . sodium chloride  10-40 mL Intracatheter Q12H  . sodium chloride  3 mL Intravenous Q12H  . vancomycin  500 mg Intravenous Q12H   Continuous Infusions:  PRN Meds:.sodium chloride, albuterol, morphine injection, sodium chloride, sodium chloride Allergies  Allergen Reactions  . Prednisone Hives  . Omnipaque [Iohexol] Itching and Rash   CBC:    Component Value Date/Time   WBC 22.2* 04/03/2014 0540   HGB 8.2* 04/03/2014 0540   HCT 24.1* 04/03/2014 0540   PLT 283 04/03/2014 0540   MCV 88.9 04/03/2014 0540   NEUTROABS 18.9* 03/27/2014 1200   LYMPHSABS 0.6* 03/27/2014 1200   MONOABS 1.2* 03/27/2014 1200   EOSABS 0.0 03/27/2014 1200   BASOSABS 0.0 03/27/2014 1200   Comprehensive Metabolic Panel:    Component Value Date/Time   NA 132* 04/03/2014 0540   K 4.8 04/03/2014 0540   CL 91* 04/03/2014 0540   CO2 29 04/03/2014 0540   BUN 42* 04/03/2014 0540   CREATININE 1.11 04/03/2014 0540   GLUCOSE 274* 04/03/2014 0540   CALCIUM 8.5 04/03/2014 0540   AST 20 03/21/2014 1929   ALT 16 03/21/2014 1929   ALKPHOS 88 03/21/2014 1929   BILITOT 0.3 03/21/2014 1929   PROT 7.3 03/21/2014 1929   ALBUMIN 3.2* 03/21/2014 1929    Vital Signs: BP 129/42  Pulse 73  Temp(Src) 97.9 F (36.6 C) (Axillary)  Resp 21  Ht 5\' 5"  (1.651 m)  Wt 58 kg (127 lb 13.9 oz)  BMI 21.28 kg/m2  SpO2 100% Filed Weights   03/28/14 0500 03/29/14 0500 03/29/14 2225  Weight: 57.3 kg (126 lb 5.2 oz) 56.8 kg (125 lb 3.5 oz) 58 kg (127 lb 13.9 oz)   08/11 0701 - 08/12 0700 In: 584 [P.O.:480; I.V.:104] Out: 800 [Urine:800]  Physical Exam:  Very frail, speaks well given dyspnea level, AOX3, on NRB Legs warm, skin pale +wheezes diffusely  Summary of Established Goals of Care and Medical Treatment  Preferences  Primary Diagnoses  1. IPF-possibly worsened by what sounds like chronic and severe reflux disease with occult recurrent aspiration inflammation issues. Additionally has evidence of pulmonary edema, CHF with an EF of 35%, hemoptysis with anticoagulation. Severe dyspnea with hypoxia now on NRB. High levels of anxiety.  Active Symptoms: 1. Dyspnea 2. Dysphagia 3. Weight Loss 4. Extremity pain  Psycho-social/Spiritual:  Patient has positive reflection on his life, on dying and reports a strong faith. His primary goal is to be at home for  the time he has left  Prognosis: <6 months, immanent death also possible- I have prepared them for all possibilities.   Palliative Performance Scale: 30  Recommendations:  1. Code Status: DNR 2. Scope of Treatment:   Maximize non-invasive medical interventions with primary goal being comfort and QOL  Home with Hospice  Aggressive symptom management  Tomorrow AM would like to move him out of step down to a MED-SURG unit (may need a sitter for safety) get him off monitors, transition to an oximizer nasal cannula tubing for O2 supplementation, 10 Liters and titrate down as tolerated- no reflex to low sats-treat him symptomatically only. Daughter wanted to wait till morning to move him due to issues with confusion-this is understandable- he also had a fall in the hospital.  Will need to work with care management in the AM to get all of his DME and home equipment ordered and setup- as well as place a referral to [facility]  3. Symptom Management:   Scheduled q4 hours Duo-nebs, had not received any of his PRN albuterol ordered for his dyspnea  Transition to an oral steroid at discharge  Started Low dose scheduled roxanol for dyspnea-prn IV morphine has been tolerated very well  Diurese aggressively for comfort - will add flomax -urinary retension has been an issues in the past.  Condom Cath  No Benzodiazapines-cause confusion,  agitation  Change his antibiotic over to oral levaquin at discharge or similar  Leave PICC in at discharge in case he needs IV meds later for comfort  Try to titrate down O2  Increased his PPI -dose I suspect he has severe and chronic reflux-would like to gte a copy of his endoscopy report from [facility].  4. Palliative Prophylaxis: will start PRN Senna-S 5. Disposition: Home with Hospice as soon as DME is setup and [facility] can admit the same day as discharge.   Time In: 4PM Time Out: 5:30 Time Total: 90 minutes Greater than 50%  of this time was spent counseling and coordinating care related to the above assessment and plan.  Signed by: Hilbert Odor, DO  04/03/2014, 5:59 PM  Please contact Palliative Medicine Team phone at [phone] for questions and concerns.

## 2014-04-03 NOTE — Progress Notes (Signed)
NUTRITION FOLLOW UP  INTERVENTION:  Continue Glucerna Shakes PO TID, each supplement provides 220 kcal and 10 grams of protein  NUTRITION DIAGNOSIS: Inadequate oral intake related to COPD as evidenced by weight loss, ongoing  Goal: Pt to meet >/= 90% of their estimated nutrition needs, unmet.  New goal is comfort care per discussion with RN.  Monitor:  PO & supplemental intake, weight trend, labs.  ASSESSMENT: 80 y.o. male with prior hx of PAD with LLE Fem-below knee popliteal bypass graft with saphenous vein. Was originally done 1999, per pt, has had to have open thrombectomy at least once in the past. Developed acute onset of pain in foot with coldness and bluish discoloration of toes. Has been admitted by [facility] and started on heparin drip.  Patient is consuming 25-50% of meals. Also drinking Glucerna Shakes between meals at times. Per discussion with RN, plans are to transition to comfort care later today.  Height: Ht Readings from Last 1 Encounters:  03/29/14 5\' 5"  (1.651 m)    Weight: Wt Readings from Last 1 Encounters:  03/29/14 127 lb 13.9 oz (58 kg)  03/27/14  125 lb 14.1 oz (57.1 kg)  03/21/14  131 lb 4.8 oz (59.557 kg)   BMI:  Body mass index is 21.28 kg/(m^2).   Estimated Nutritional Needs: Kcal: 1800-2100 Protein: 100-110 g Fluid: 1.8-2.1 L/day  Skin: Intact  Diet Order: Heart Healthy/Carbohydrate Modified   Supplements: Glucerna Shakes PO TID   Intake/Output Summary (Last 24 hours) at 04/03/14 1014 Last data filed at 04/02/14 2000  Gross per 24 hour  Intake    320 ml  Output    600 ml  Net   -280 ml    Labs:   Recent Labs Lab 04/01/14 0725 04/02/14 0235 04/03/14 0540  NA 138 133* 132*  K 4.2 4.6 4.8  CL 95* 92* 91*  CO2 34* 31 29  BUN 38* 41* 42*  CREATININE 1.11 1.07 1.11  CALCIUM 8.7 8.7 8.5  MG 2.0 2.2 2.2  PHOS 3.1 3.3 4.5  GLUCOSE 123* 160* 274*    CBG (last 3)   Recent Labs  04/02/14 2311 04/03/14 0330 04/03/14 0737   GLUCAP 72 229* 214*    Scheduled Meds: . sodium chloride   Intravenous Once  . aspirin EC  325 mg Oral Daily  . atorvastatin  80 mg Oral q1800  . carvedilol  3.125 mg Oral BID WC  . ceFEPime (MAXIPIME) IV  1 g Intravenous Q24H  . feeding supplement (GLUCERNA SHAKE)  237 mL Oral TID BM  . furosemide  40 mg Oral Daily  . insulin aspart  0-15 Units Subcutaneous 6 times per day  . insulin glargine  10 Units Subcutaneous Daily  . lisinopril  20 mg Oral Daily  . methylPREDNISolone (SOLU-MEDROL) injection  40 mg Intravenous Q12H  . multivitamin with minerals  1 tablet Oral Daily  . pantoprazole  40 mg Oral BID AC  . sodium chloride  10-40 mL Intracatheter Q12H  . sodium chloride  3 mL Intravenous Q12H  . vancomycin  500 mg Intravenous Q12H    Continuous Infusions: . heparin Stopped (04/03/14 0430)    Past Medical History  Diagnosis Date  . Diabetes mellitus   . Hyperlipidemia   . Hypertension   . Anemia   . COPD (chronic obstructive pulmonary disease)   . Peripheral vascular disease     Past Surgical History  Procedure Laterality Date  . Pr vein bypass graft,aorto-fem-pop  Joaquin Courts, RD, LDN, CNSC Pager [phone] After Hours Pager [phone]

## 2014-04-03 NOTE — Progress Notes (Signed)
Inpatient Diabetes Program Recommendations  AACE/ADA: New Consensus Statement on Inpatient Glycemic Control (2013)  Target Ranges:  Prepandial:   less than 140 mg/dL      Peak postprandial:   less than 180 mg/dL (1-2 hours)      Critically ill patients:  140 - 180 mg/dL   Results for NYSIR, KRISHNAMOORTHY (MRN [id number]) as of 04/03/2014 13:27  Ref. Range 04/02/2014 07:25 04/02/2014 11:33 04/02/2014 14:54 04/02/2014 16:27 04/02/2014 19:14 04/02/2014 23:11 04/03/2014 03:30 04/03/2014 07:37 04/03/2014 12:10  Glucose-Capillary Latest Range: 70-99 mg/dL 314 (H) 970 (H) 263 (H) 360 (H) 220 (H) 72 229 (H) 214 (H) 212 (H)   Diabetes history: Type 2  Outpatient Diabetes medications: Glucophage 1000 mg bid  Current orders for Inpatient glycemic control: Lantus 10 units daily, Novolog 0-15 units Q4H  Inpatient Diabetes Program Recommendations Insulin - Basal: Please consider increasing Lantus to 14 units daily (based on 58 kg x 0.25 units). Insulin - Meal Coverage: If patient is eating at least 50% of meals, please consider ordering Novolog 3 units TID with meals for meal coverage.  Thanks, Orlando Penner, RN, MSN, CCRN Diabetes Coordinator Inpatient Diabetes Program [phone] (Team Pager) [phone] ([facility] office) [phone] [facility] office)

## 2014-04-03 NOTE — Progress Notes (Signed)
Patient Name: Trevor Morris Date of Encounter: 04/03/2014     Active Problems:   Occlusion of left femoropopliteal bypass graft   Acute respiratory failure with hypoxia   NSTEMI (non-ST elevated myocardial infarction)   COPD (chronic obstructive pulmonary disease)   Acute combined systolic and diastolic heart failure   Hypokalemia    SUBJECTIVE  Overnight event noted. Confused overnight, found on the floor by nurse, appeared anxious, given Xanax. Back on nonrebreather. Patient was asleep when seen. CT of head obtained, negative for bleed  CURRENT MEDS . sodium chloride   Intravenous Once  . aspirin EC  325 mg Oral Daily  . atorvastatin  80 mg Oral q1800  . carvedilol  3.125 mg Oral BID WC  . ceFEPime (MAXIPIME) IV  1 g Intravenous Q24H  . feeding supplement (GLUCERNA SHAKE)  237 mL Oral TID BM  . furosemide  40 mg Oral Daily  . insulin aspart  0-15 Units Subcutaneous 6 times per day  . insulin glargine  10 Units Subcutaneous Daily  . lisinopril  20 mg Oral Daily  . methylPREDNISolone (SOLU-MEDROL) injection  40 mg Intravenous Q12H  . multivitamin with minerals  1 tablet Oral Daily  . pantoprazole  40 mg Oral BID AC  . sodium chloride  10-40 mL Intracatheter Q12H  . sodium chloride  3 mL Intravenous Q12H  . vancomycin  500 mg Intravenous Q12H    OBJECTIVE  Filed Vitals:   04/02/14 1916 04/02/14 2309 04/03/14 0549 04/03/14 0740  BP: 113/46 137/45  136/43  Pulse: 77 69  77  Temp: 97.7 F (36.5 C) 96.8 F (36 C) 97.4 F (36.3 C) 97.6 F (36.4 C)  TempSrc: Axillary Axillary Axillary Axillary  Resp: 28 20  25   Height:      Weight:      SpO2: 98% 97%  93%    Intake/Output Summary (Last 24 hours) at 04/03/14 1056 Last data filed at 04/02/14 2000  Gross per 24 hour  Intake    320 ml  Output    600 ml  Net   -280 ml   Filed Weights   03/28/14 0500 03/29/14 0500 03/29/14 2225  Weight: 126 lb 5.2 oz (57.3 kg) 125 lb 3.5 oz (56.8 kg) 127 lb 13.9 oz (58 kg)      PHYSICAL EXAM  General: asleep, on nonrebreather Neuro: Alert and oriented X 3. Moves all extremities spontaneously. Psych: Normal affect. HEENT:  Normal  Neck: Supple without bruits or JVD. Lungs:  Resp regular and unlabored, decreased breath sound Heart: RRR no s3, s4, or murmurs. Abdomen: Soft, non-tender, non-distended, BS + x 4.  Extremities: No clubbing, cyanosis or edema. DP/PT/Radials 2+ and equal bilaterally.  Accessory Clinical Findings  CBC  Recent Labs  04/02/14 0235 04/03/14 0540  WBC 19.1* 22.2*  HGB 9.1* 8.2*  HCT 26.7* 24.1*  MCV 87.0 88.9  PLT 310 283   Basic Metabolic Panel  Recent Labs  04/02/14 0235 04/03/14 0540  NA 133* 132*  K 4.6 4.8  CL 92* 91*  CO2 31 29  GLUCOSE 160* 274*  BUN 41* 42*  CREATININE 1.07 1.11  CALCIUM 8.7 8.5  MG 2.2 2.2  PHOS 3.3 4.5    TELE  NSR with HR 60-70s, LBBB  ECG  No EKG today  Radiology/Studies  Dg Chest 2 View  03/21/2014   CLINICAL DATA:  Preoperative evaluation; hypertension ; emphysema  EXAM: CHEST  2 VIEW  COMPARISON:  None.  FINDINGS: There is  underlying emphysema. There is extensive interstitial fibrosis bilaterally. There may be a degree of interstitial edema superimposed. There is no airspace consolidation. The the heart size is normal. The pulmonary vascularity reflects underlying emphysema. No adenopathy. No bone lesions.  IMPRESSION: Emphysema with widespread interstitial fibrosis. There may be some superimposed interstitial edema ; a degree of superimposed congestive heart failure cannot be excluded radiographically. There is no airspace consolidation or adenopathy apparent.   Electronically Signed   By: Bretta Bang M.D.   On: 03/21/2014 21:35   Ct Head Wo Contrast  04/03/2014   CLINICAL DATA:  Found on floor.  Assess for head injury.  EXAM: CT HEAD WITHOUT CONTRAST  TECHNIQUE: Contiguous axial images were obtained from the base of the skull through the vertex without intravenous  contrast.  COMPARISON:  None.  FINDINGS: There is no evidence of acute infarction, mass lesion, or intra- or extra-axial hemorrhage on CT.  Prominence of the ventricles and sulci reflects mild to moderate cortical volume loss. Scattered periventricular white matter change likely reflects small vessel ischemic microangiopathy.  The brainstem and fourth ventricle are within normal limits. The basal ganglia are unremarkable in appearance. The cerebral hemispheres demonstrate grossly normal gray-white differentiation. No mass effect or midline shift is seen.  There is no evidence of fracture; visualized osseous structures are unremarkable in appearance. The orbits are within normal limits. There is near-complete opacification of the left mastoid air cells. The paranasal sinuses and right mastoid air cells are well-aerated. No significant soft tissue abnormalities are seen.  IMPRESSION: 1. No evidence of traumatic intracranial injury or fracture. 2. Mild to moderate cortical volume loss and scattered small vessel ischemic microangiopathy. 3. Near complete opacification of the left mastoid air cells.   Electronically Signed   By: Roanna Raider M.D.   On: 04/03/2014 05:02   Ct Chest Wo Contrast  03/25/2014   ADDENDUM REPORT: 03/25/2014 16:49  ADDENDUM: There is also diffuse distal esophageal wall thickening. This could be due to incomplete distention, esophagitis or mass. These possibilities could be differentiated with direct endoscopic visualization.   Electronically Signed   By: Gordan Payment M.D.   On: 03/25/2014 16:49   03/25/2014   CLINICAL DATA:  Evaluate lung infiltrates seen on the portable chest earlier today.  EXAM: CT CHEST WITHOUT CONTRAST  TECHNIQUE: Multidetector CT imaging of the chest was performed following the standard protocol without IV contrast.  COMPARISON:  Portable chest obtained earlier today. Abdomen CT dated 02/19/2014.  FINDINGS: Interval diffuse increase in prominence of the interstitial  markings throughout the majority of both lungs, including ground-glass opacities. Extensive bullous changes bilaterally. No masses or focal consolidation. Enlarged precarinal node with a short axis diameter of 19.7 mm on image number 23. Mildly enlarged AP window nodes, the largest with a short axis diameter of 9.3 mm on image number 22.  Small right pleural effusion. Dense coronary artery calcifications. Diffuse low density of the blood. Diffuse distal esophageal wall thickening with a maximum thickness of 10.3 mm on image number 49. Unremarkable upper abdomen. Mild thoracic spine degenerative changes.  IMPRESSION: 1. Extensive bilateral acute interstitial lung disease superimposed on marked changes of COPD. Differential considerations include pulmonary edema and viral pneumonitis. 2. Small right pleural effusion. 3. Mild mediastinal adenopathy, possibly reactive. 4. Dense coronary artery atheromatous calcifications. 5. Anemia.  Electronically Signed: By: Gordan Payment M.D. On: 03/25/2014 16:46   Ir Angiogram Extremity Right  03/22/2014   CLINICAL DATA:  Thrombosed left femoral popliteal arterial saphenous  vein bypass graft.  EXAM: IR INFUSION THROMBOL ARTERIAL INITIAL (MS); RIGHT EXTREMITY ARTERIOGRAPHY; IR ULTRASOUND GUIDANCE VASC ACCESS RIGHT  FLUOROSCOPY TIME:  20 min and 12 seconds.  MEDICATIONS AND MEDICAL HISTORY: Versed two mg, Fentanyl 100 mcg.  Additional Medications: Heparin 5000 units.  ANESTHESIA/SEDATION: Moderate sedation time: 60 minutes  CONTRAST:  150 cc Omnipaque 300  PROCEDURE: The procedure, risks, benefits, and alternatives were explained to the patient. Questions regarding the procedure were encouraged and answered. The patient understands and consents to the procedure.  The right groin was prepped with Betadine in a sterile fashion, and a sterile drape was applied covering the operative field. A sterile gown and sterile gloves were used for the procedure.  Under sonographic guidance, a  micropuncture needle was inserted into the right common femoral artery and removed over a 018 wire, which was up sized to a Bentson. A 5 French sheath was inserted. A 4 French omni Flush catheter was advanced over the wire to the aorta. Pelvic angiography was performed.  The Omni Flush catheter was carefully advanced over a glidewire into the left common iliac artery and finally into the left external iliac artery. The catheter was removed over a Rosen wire. The 5 French sheath was then exchanged for a 35 cm 6 French sheath. This could be advanced to the upper right common iliac artery. It could not be advanced across the bifurcation secondary to significant plaque.  A total of 5000 units of heparin was injected peripherally.  A vertebral 5 French catheter was advanced over the [place] wire to the left external iliac artery and angiography of the left groin and proximal thigh were obtained.  The vertebral catheter was then advanced over a stiff glidewire into the thrombosed venous bypass graft, to the level of the knee. The catheter was removed over a Rosen wire. A 135 cm 50 cm infusion length 5 French multi side hole catheter was then advanced over the [place] wire to the level of the knee. The catheter and sheath were secured in place. TNK at 0.5 milligrams/hour was instituted into the infusion catheter.  FINDINGS: Pelvic angiography demonstrates atherosclerotic plaque at the origin of the right and left common iliac arteries without narrowing greater than 50%. The left external iliac artery is patent.  The left common femoral artery is patent. The left superficial femoral artery is severely diseased. The left femoral popliteal artery is occluded.  The next series of images demonstrate placement of an infusion catheter into the femoral popliteal artery bypass graft.  COMPLICATIONS: None  IMPRESSION: Successful placement of an infusion catheter into a thrombosed left femoral popliteal artery bypass graft. TNK was  instituted at 0.5 milligrams/hour.   Electronically Signed   By: Maryclare Bean M.D.   On: 03/22/2014 14:49   Ir Fem Pop Art Pta Mod Sed  03/24/2014   CLINICAL DATA:  48 hr post left lower extremity fem-pop graft lysis.  EXAM: IR THROMB F/U EVAL ART/VEN FINAL DAY; IR FEM POP ART PTA  FLUOROSCOPY TIME:  17 min and 21 seconds.  MEDICATIONS AND MEDICAL HISTORY: Versed 0.5 mg, Fentanyl 25 mcg.  Additional Medications: Heparin 500 units.  ANESTHESIA/SEDATION: Moderate sedation time: 85 minutes  CONTRAST:  50 cc Visipaque 320  PROCEDURE: The procedure, risks, benefits, and alternatives were explained to the patient. Questions regarding the procedure were encouraged and answered. The patient understands and consents to the procedure.  The right groin was prepped with Betadine in a sterile fashion, and a sterile drape was  applied covering the operative field. A sterile gown and sterile gloves were used for the procedure.  Contrast was injected into the multi side-hole catheter and a right lower extremity runoff was performed. This demonstrated resolution of the thrombus with residual narrowing at the distal graft to tibioperoneal trunk anastomosis as well as a distal tibioperoneal trunk stenosis. The heparin was stopped during the procedure. After approximately 20 min, repeat angiogram demonstrates recurrent early occlusion at these areas of narrowing. 500 units of additional heparin was administered.  The infusion catheter was exchanged over a 300 cm stiff a 018 wire for a 4 mm x 2 cm balloon.  A glidewire was advanced through the balloon and across the 2 lesions into the peroneal artery. The glidewire was exchanged for the stiff 018 wire. The 2 lesions were dilated.  Post angioplasty imaging with angiography demonstrates a dissection in the tibioperoneal trunk narrowing location. This area was read dilated and held in place for 60 seconds. Repeat angiogram demonstrates wide patency at the 2 areas of stenosis.  A full left  lower extremity runoff demonstrates patency of the left common femoral artery, bypass graft, tibioperoneal trunk, and with 2 vessel runoff via the posterior tibial and peroneal arteries.  The sheath was removed and hemostasis was achieved with an exo seal device.  FINDINGS: Initial angiogram demonstrates resolution of the thrombus throughout the fem-pop venous bypass graft but 2 areas of narrowing distally, at the distal anastomosis, and within the tibioperoneal trunk.  After angioplasty, initial repeat imaging demonstrates a dissection at the tibioperoneal trunk stenosis.  After repeat angioplasty, the vessel was noted to be widely patent with some irregularity.  Left lower extremity runoff was performed demonstrating patency of the graft, distal anastomosis to the tibioperoneal trunk, and 2 vessel runoff via the posterior tibial artery and peroneal artery.  COMPLICATIONS: None  IMPRESSION: Successful lysis of a femoral popliteal bypass graft and successful angioplasty of 2 areas of infrapopliteal narrowing as described.   Electronically Signed   By: Maryclare Bean M.D.   On: 03/24/2014 12:07   Ir US Guide Vasc Access Right  03/22/2014   CLINICAL DATA:  Thrombosed left femoral popliteal arterial saphenous vein bypass graft.  EXAM: IR INFUSION THROMBOL ARTERIAL INITIAL (MS); RIGHT EXTREMITY ARTERIOGRAPHY; IR ULTRASOUND GUIDANCE VASC ACCESS RIGHT  FLUOROSCOPY TIME:  20 min and 12 seconds.  MEDICATIONS AND MEDICAL HISTORY: Versed two mg, Fentanyl 100 mcg.  Additional Medications: Heparin 5000 units.  ANESTHESIA/SEDATION: Moderate sedation time: 60 minutes  CONTRAST:  150 cc Omnipaque 300  PROCEDURE: The procedure, risks, benefits, and alternatives were explained to the patient. Questions regarding the procedure were encouraged and answered. The patient understands and consents to the procedure.  The right groin was prepped with Betadine in a sterile fashion, and a sterile drape was applied covering the operative field.  A sterile gown and sterile gloves were used for the procedure.  Under sonographic guidance, a micropuncture needle was inserted into the right common femoral artery and removed over a 018 wire, which was up sized to a Bentson. A 5 French sheath was inserted. A 4 French omni Flush catheter was advanced over the wire to the aorta. Pelvic angiography was performed.  The Omni Flush catheter was carefully advanced over a glidewire into the left common iliac artery and finally into the left external iliac artery. The catheter was removed over a Rosen wire. The 5 French sheath was then exchanged for a 35 cm 6 French sheath. This could be advanced  to the upper right common iliac artery. It could not be advanced across the bifurcation secondary to significant plaque.  A total of 5000 units of heparin was injected peripherally.  A vertebral 5 French catheter was advanced over the [place] wire to the left external iliac artery and angiography of the left groin and proximal thigh were obtained.  The vertebral catheter was then advanced over a stiff glidewire into the thrombosed venous bypass graft, to the level of the knee. The catheter was removed over a Rosen wire. A 135 cm 50 cm infusion length 5 French multi side hole catheter was then advanced over the [place] wire to the level of the knee. The catheter and sheath were secured in place. TNK at 0.5 milligrams/hour was instituted into the infusion catheter.  FINDINGS: Pelvic angiography demonstrates atherosclerotic plaque at the origin of the right and left common iliac arteries without narrowing greater than 50%. The left external iliac artery is patent.  The left common femoral artery is patent. The left superficial femoral artery is severely diseased. The left femoral popliteal artery is occluded.  The next series of images demonstrate placement of an infusion catheter into the femoral popliteal artery bypass graft.  COMPLICATIONS: None  IMPRESSION: Successful placement of an  infusion catheter into a thrombosed left femoral popliteal artery bypass graft. TNK was instituted at 0.5 milligrams/hour.   Electronically Signed   By: Maryclare Bean M.D.   On: 03/22/2014 14:49   Dg Chest Port 1 View  04/03/2014   CLINICAL DATA:  Difficulty breathing  EXAM: PORTABLE CHEST - 1 VIEW  COMPARISON:  April 02, 2014  FINDINGS: Central catheter tip is in the right atrium, stable. No pneumothorax. There is widespread interstitial and alveolar edema, stable. Heart is enlarged. The pulmonary vascularity shows evidence of pulmonary venous hypertension. No adenopathy is appreciable.  IMPRESSION: No pneumothorax. Widespread interstitial and patchy alveolar edema. The appearance is indicative of a degree of congestive heart failure. There may well be superimposed ARDS. No new opacity is identified compared to the study 1 day prior.   Electronically Signed   By: Bretta Bang M.D.   On: 04/03/2014 08:12   Dg Chest Port 1 View  04/02/2014   CLINICAL DATA:  Shortness of breath  EXAM: PORTABLE CHEST - 1 VIEW  COMPARISON:  03/30/2014  FINDINGS: Cardiac shadow is stable. A right-sided PICC line is again seen and stable. Diffuse bilateral infiltrates are again noted and stable. No new focal abnormality is seen. No bony abnormality is noted.  IMPRESSION: No change from the prior exam.   Electronically Signed   By: Alcide Clever M.D.   On: 04/02/2014 08:04   Dg Chest Port 1 View  03/30/2014   CLINICAL DATA:  80 year old male with shortness of breath  EXAM: PORTABLE CHEST - 1 VIEW  COMPARISON:  03/28/2014 and prior chest radiographs dating back to 03/21/2014  FINDINGS: The cardiomediastinal silhouette is stable.  A right PICC line is again identified with tip overlying upper right atrium.  Diffuse bilateral airspace opacities are again noted and relatively unchanged.  There is no evidence of pneumothorax or definite effusion.  IMPRESSION: Stable chest radiograph with diffuse bilateral airspace opacities.    Electronically Signed   By: Laveda Abbe M.D.   On: 03/30/2014 12:38   Dg Chest Port 1 View  03/28/2014   CLINICAL DATA:  Check endotracheal tube placement  EXAM: PORTABLE CHEST - 1 VIEW  COMPARISON:  03/27/2014  FINDINGS: A right-sided PICC line is  again noted and stable. No endotracheal tube is identified at this time. Diffuse increased density is again noted in the right mid and upper lung as well as the left lower lung. The overall appearance is stable from the prior exam.  IMPRESSION: No significant change from the prior day. No endotracheal tube is noted.   Electronically Signed   By: Alcide CleverMark  Lukens M.D.   On: 03/28/2014 07:49   Dg Chest Port 1 View  03/27/2014   CLINICAL DATA:  Lung infiltrates  EXAM: PORTABLE CHEST - 1 VIEW  COMPARISON:  Chest radiograph and chest CT March 25, 2014  FINDINGS: There is underlying emphysematous change. There is a degree of underlying interstitial fibrosis. Widespread interstitial and alveolar edema remain without appreciable change. Heart is mildly enlarged. The pulmonary vascularity is within normal limits. Central catheter tip is in the right atrium slightly distal to the cavoatrial junction, stable. No pneumothorax.  IMPRESSION: Suspect congestive heart failure superimposed on chronic interstitial fibrosis and emphysema. There may well be underlying infectious pneumonia as well. The appearance is stable compared to 2 days prior.   Electronically Signed   By: Bretta BangWilliam  Woodruff M.D.   On: 03/27/2014 07:19   Dg Chest Port 1 View  03/25/2014   CLINICAL DATA:  Shortness of breath  EXAM: PORTABLE CHEST - 1 VIEW  COMPARISON:  03/24/2014  FINDINGS: Cardiac shadow is within normal limits. Diffuse bilateral infiltrates are again identified. A PICC line is now been placed on the right with the catheter tip at the cavoatrial junction. No pneumothorax is seen. No other focal abnormality is noted.  IMPRESSION: New PICC line in satisfactory position.  Bilateral parenchymal infiltrates  stable from the previous day.   Electronically Signed   By: Alcide CleverMark  Lukens M.D.   On: 03/25/2014 09:12   Dg Chest Port 1 View  03/24/2014   CLINICAL DATA:  Short of breath, coughing congestion  EXAM: PORTABLE CHEST - 1 VIEW  COMPARISON:  Prior chest x-ray 03/24/2014 at 3:28 a.m.  FINDINGS: Persistent diffuse bilateral predominantly interstitial opacities. There has been no significant interval change compared to the radiograph from earlier this morning. Cardiac and mediastinal contours remain unchanged. Atherosclerotic calcifications are present within the transverse aorta. Upper abdominal gas pattern is unremarkable. No acute osseous abnormality.  IMPRESSION: No significant interval change in the appearance of the lungs. Persistent diffuse bilateral interstitial opacities which may reflect pulmonary fibrosis, interstitial edema or atypical infection.   Electronically Signed   By: Malachy MoanHeath  McCullough M.D.   On: 03/24/2014 14:40   Dg Chest Port 1 View  03/24/2014   CLINICAL DATA:  Hemoptysis and wheezing.  EXAM: PORTABLE CHEST - 1 VIEW  COMPARISON:  03/21/2014  FINDINGS: Borderline heart size. Diffuse coarse interstitial infiltrates throughout both lungs likely representing diffuse interstitial fibrosis. No significant change since previous study, allowing for technical differences. No focal consolidation. No blunting of costophrenic angles. No pneumothorax.  IMPRESSION: Diffuse interstitial infiltrates in the lungs likely fibrosis. No change since prior study.   Electronically Signed   By: Burman NievesWilliam  Stevens M.D.   On: 03/24/2014 04:05   Ir Infusion Thrombol Arterial Initial (ms)  03/22/2014   CLINICAL DATA:  Thrombosed left femoral popliteal arterial saphenous vein bypass graft.  EXAM: IR INFUSION THROMBOL ARTERIAL INITIAL (MS); RIGHT EXTREMITY ARTERIOGRAPHY; IR ULTRASOUND GUIDANCE VASC ACCESS RIGHT  FLUOROSCOPY TIME:  20 min and 12 seconds.  MEDICATIONS AND MEDICAL HISTORY: Versed two mg, Fentanyl 100 mcg.   Additional Medications: Heparin 5000 units.  ANESTHESIA/SEDATION:  Moderate sedation time: 60 minutes  CONTRAST:  150 cc Omnipaque 300  PROCEDURE: The procedure, risks, benefits, and alternatives were explained to the patient. Questions regarding the procedure were encouraged and answered. The patient understands and consents to the procedure.  The right groin was prepped with Betadine in a sterile fashion, and a sterile drape was applied covering the operative field. A sterile gown and sterile gloves were used for the procedure.  Under sonographic guidance, a micropuncture needle was inserted into the right common femoral artery and removed over a 018 wire, which was up sized to a Bentson. A 5 French sheath was inserted. A 4 French omni Flush catheter was advanced over the wire to the aorta. Pelvic angiography was performed.  The Omni Flush catheter was carefully advanced over a glidewire into the left common iliac artery and finally into the left external iliac artery. The catheter was removed over a [place] wire. The 5 French sheath was then exchanged for a 35 cm 6 French sheath. This could be advanced to the upper right common iliac artery. It could not be advanced across the bifurcation secondary to significant plaque.  A total of 5000 units of heparin was injected peripherally.  A vertebral 5 French catheter was advanced over the [place] wire to the left external iliac artery and angiography of the left groin and proximal thigh were obtained.  The vertebral catheter was then advanced over a stiff glidewire into the thrombosed venous bypass graft, to the level of the knee. The catheter was removed over a [place] wire. A 135 cm 50 cm infusion length 5 French multi side hole catheter was then advanced over the [place] wire to the level of the knee. The catheter and sheath were secured in place. TNK at 0.5 milligrams/hour was instituted into the infusion catheter.  FINDINGS: Pelvic angiography demonstrates atherosclerotic  plaque at the origin of the right and left common iliac arteries without narrowing greater than 50%. The left external iliac artery is patent.  The left common femoral artery is patent. The left superficial femoral artery is severely diseased. The left femoral popliteal artery is occluded.  The next series of images demonstrate placement of an infusion catheter into the femoral popliteal artery bypass graft.  COMPLICATIONS: None  IMPRESSION: Successful placement of an infusion catheter into a thrombosed left femoral popliteal artery bypass graft. TNK was instituted at 0.5 milligrams/hour.   Electronically Signed   By: Maryclare Bean M.D.   On: 03/22/2014 14:49   Ir Rande Lawman F/u Eval Art/ven Basil Dess Day (ms)  03/23/2014   CLINICAL DATA:  24 hr post left lower extremity arterial lysis check  EXAM: IR THROMB F/U EVAL ART/VEN SUBSEQ DAY (MS)  FLUOROSCOPY TIME:  30 seconds.  MEDICATIONS AND MEDICAL HISTORY: None  ANESTHESIA/SEDATION: None  CONTRAST:  20 cc Omnipaque 350  PROCEDURE: The procedure, risks, benefits, and alternatives were explained to the patient. Questions regarding the procedure were encouraged and answered. The patient understands and consents to the procedure.  Contrast was injected into the infusion catheter and imaging was obtained.  FINDINGS: The left common femoral artery remains patent.  The saphenous vein graft is patent to the level of the knee. It is occluded at the knee. The infusion catheter remains in place. Runoff tibial vessels were not seen to opacified.  COMPLICATIONS: None  IMPRESSION: There is improvement with near complete lysed cysts of the saphenous vein graft. There continues to be occlusive thrombus distally. The plan is for an additional 24 hr  of lysis with a follow-up check tomorrow morning.   Electronically Signed   By: Maryclare Bean M.D.   On: 03/23/2014 09:12   Ir Rande Lawman F/u Eval Art/ven Final Day (ms)  03/24/2014   CLINICAL DATA:  48 hr post left lower extremity fem-pop graft lysis.   EXAM: IR THROMB F/U EVAL ART/VEN FINAL DAY; IR FEM POP ART PTA  FLUOROSCOPY TIME:  17 min and 21 seconds.  MEDICATIONS AND MEDICAL HISTORY: Versed 0.5 mg, Fentanyl 25 mcg.  Additional Medications: Heparin 500 units.  ANESTHESIA/SEDATION: Moderate sedation time: 85 minutes  CONTRAST:  50 cc Visipaque 320  PROCEDURE: The procedure, risks, benefits, and alternatives were explained to the patient. Questions regarding the procedure were encouraged and answered. The patient understands and consents to the procedure.  The right groin was prepped with Betadine in a sterile fashion, and a sterile drape was applied covering the operative field. A sterile gown and sterile gloves were used for the procedure.  Contrast was injected into the multi side-hole catheter and a right lower extremity runoff was performed. This demonstrated resolution of the thrombus with residual narrowing at the distal graft to tibioperoneal trunk anastomosis as well as a distal tibioperoneal trunk stenosis. The heparin was stopped during the procedure. After approximately 20 min, repeat angiogram demonstrates recurrent early occlusion at these areas of narrowing. 500 units of additional heparin was administered.  The infusion catheter was exchanged over a 300 cm stiff a 018 wire for a 4 mm x 2 cm balloon.  A glidewire was advanced through the balloon and across the 2 lesions into the peroneal artery. The glidewire was exchanged for the stiff 018 wire. The 2 lesions were dilated.  Post angioplasty imaging with angiography demonstrates a dissection in the tibioperoneal trunk narrowing location. This area was read dilated and held in place for 60 seconds. Repeat angiogram demonstrates wide patency at the 2 areas of stenosis.  A full left lower extremity runoff demonstrates patency of the left common femoral artery, bypass graft, tibioperoneal trunk, and with 2 vessel runoff via the posterior tibial and peroneal arteries.  The sheath was removed and  hemostasis was achieved with an exo seal device.  FINDINGS: Initial angiogram demonstrates resolution of the thrombus throughout the fem-pop venous bypass graft but 2 areas of narrowing distally, at the distal anastomosis, and within the tibioperoneal trunk.  After angioplasty, initial repeat imaging demonstrates a dissection at the tibioperoneal trunk stenosis.  After repeat angioplasty, the vessel was noted to be widely patent with some irregularity.  Left lower extremity runoff was performed demonstrating patency of the graft, distal anastomosis to the tibioperoneal trunk, and 2 vessel runoff via the posterior tibial artery and peroneal artery.  COMPLICATIONS: None  IMPRESSION: Successful lysis of a femoral popliteal bypass graft and successful angioplasty of 2 areas of infrapopliteal narrowing as described.   Electronically Signed   By: Maryclare Bean M.D.   On: 03/24/2014 12:07    ASSESSMENT AND PLAN  1. Acute respiratory failure  - h/o pulmonary fibrosis - h/o contrast allergy s/p angiography and TNK for lysis of distal clot of LE bypass graft 7/31. Started to have significant dyspnea on 8/2,  - per [facility] IPF flare following angiography  - increasing O2 requirement, [facility] increased steroid dosing after immune panel back negative  - WBC trending up (22 this morning), likely related to steroid, restarting abx  - per [facility], whether or not he can leave the hospital depend on if he improve in the  next 48 hours  2. NSTEMI with anterior akinesis on echo  - in the setting of severe anemia, trop 0.49 --> 0.41 --> 0.42  - previous considered for cath, joined with family meeting with [facility] group briefly this am to discuss goal of care.  - Would not be a candidate for cath given current resp status, ?if patient resp function will improve enough for him to go out of hospital - after extensive discussion with family and [facility], family has agreed to paliative care consult and DNR status - would consider  medical/conservative management at this point. Add ACEI/ARB when able. Continue coreg. No need for blood thinner. Probably wound avoid plavix either with recent anemia (hgb 8.2) and just continue on ASA  3. Acute systolic heart failure with EF 30-35%  - ?underlying ischemia vs takotsubo's, given LBBB, catheterization is recommended, but not safe to do that from a respiratory standpoint.  - continue lasix, Cr continue to improve   4. Anemia  5. Acute renal insufficiency - creatinine is slowly improving  6. LBBB   Signed, Azalee Course PA-C Pager: [phone]   Attending Note:   The patient was seen and examined.  Agree with assessment and plan as noted above.  Changes made to the above note as needed.  I agree with the decision to not do a cath.  At this point, the risk is too great. No indication for heparin or coumadin.  His troponin levels are more c/w demand ischemia and not an acute plaque rupture.    Will sign off. Call for questions    Alvia Grove., MD, [facility] 04/03/2014, 12:24 PM [address]  [address] Office [phone] Pager [phone]

## 2014-04-03 NOTE — Significant Event (Signed)
Interval PCCM Progress Note  Called to bedside by [facility] MD to assess pt who had been found on the floor by RN.  RN states that pt was anxious earlier in the evening and received 0.25mg  of Xanax.  Since that time, he had been very sleepy through the night.  She did notice that his NRB mask occasionally slipped down off his face; however, he was able to maintain O2 sats during these episodes.  At roughly 3:30 AM, monitor began to alarm.  Upon RN arrival to room, she found pt down on the ground.  She assisted him back into bed, re-applied his NRB and got him hooked back on to the monitor.  SpO2 was 90% and pt was very sleepy.  Upon my arrival to room, pt was sleepy but easily arousable to voice.  He was able to tell me his name and that he was in the hospital.  SpO2 was gradually increasing and reached up to 97% during my assessment.  He denied any headache, chest pain, SOB.  He did not have any obvious signs of neuro deficits.  Exam: Gen: Sleepy, easily arouses to voice. HEENT:  [place]/AT.  NRB in place. Neuro:  No focal deficits noted.  MAE's. CV:  RRR, no M/R/G. Lungs:  CTAB. Abd: BS x 4, soft, NT/ND. Ext: Warm, no edema.   A/P: Acute on chronic hypoxic respiratory failure P: Continue NRB. No need for emergent intubation at this time.  S/p fall - unknown reason P: D/c Heparin gtt. D/c PRN Xanax. STAT Head CT without contrast. Day team to re-asses in AM.   Rutherford Guys, PA - [facility] [place] [facility] Pgr: [phone]  or [phone]

## 2014-04-03 NOTE — Progress Notes (Signed)
PULMONARY / CRITICAL CARE MEDICINE  Name: Trevor Morris MRN: [id number] DOB: March 08, 1934    ADMISSION DATE:  03/21/2014 CONSULTATION DATE: Lillia Mountain  REFERRING MD :  Darrick Penna   CHIEF COMPLAINT:  Acute hypoxic respiratory failure   INITIAL PRESENTATION:  80 year old male w/ known h/o CM allergy. Underwent angiography and TNK administration for lysis of distal clot at his by-pass graft site on 7/31. His post-op course was complicated by episodes of scant hemoptysis. First noted 8/1 and continued so TNK was eventually stopped. He went to arteriogram 3 times total between 7/31 and 8/2. On 8/2 he began to have significant increase in dyspnea and by the time he returned to the SICU after angiogram required 100% NRB. He received lasix and supportive care. PCCM asked to see 8/3 as symptoms had continued to worsen.    reports that he quit smoking about 26 years ago.    LINES/TUBES: PICC 8/3>>   SIGNIFICANT EVENTS: 7/30 admitted for clotted fem-pop bypass graft.   7/31: went to IR for angiogram and TNK.   - 7/31: arteriogram: Pelvic angiography demonstrates atherosclerotic plaque at the origin of the right and left common iliac arteries without narrowing greater than 50%. The left external iliac artery is patent. Successful placement of an infusion catheter into a thrombosed left femoral popliteal artery bypass graft. TNK was instituted at 0.5 milligrams/hour  8/1, 230 p: TNK stopped due to scant hemoptysis. Resumed at 5p. 1/2 dose   - 8/1: arteriogram: improvement with near complete lysed cysts of the saphenous vein graft. There continues to be occlusive thrombus distally. The plan is for an additional 24 hr of lysis with a follow-up check 8/2  - 8/1 630: hemoptysis returned and was not resumed. Heparin continued.  8/2: increased shortness of breath.   - 8/2 third trip to angiogram: demonstrates resolution of the thrombus throughout the fem-pop venous bypass graft but 2 areas of narrowing distally,  at the distal anastomosis, and within the tibioperoneal trunk.   - 8/2: placed on 100% NRB after returning from angiogram.   8/3 PCCM called. Still very hypoxic on 100% NRB. Had episode where sats dropped to 70s. Heparin stopped due to some episode of hemoptysis.   - 8/3 CT chest: 1. Extensive bilateral acute interstitial lung disease superimposed on marked changes of COPD. (pulmonary edema vs viral pneumonitis). Small right pleural effusion. Mild mediastinal adenopathy. Dense coronary artery atheromatous calcifications. There is also diffuse distal esophageal wall thickening. This could be due to incomplete distention, esophagitis or mass. These possibilities could be differentiated with direct endoscopic visualization  8/4 diuresis, cards consulted,  - 8/4 echo> LVEF 30-35%, akinesis of anterior wall, grade 2 diastolic dysfunction, PA pressure , RV mildly reduced function    8/10 remains on NRB with 70% fio2  8/11: fall overnight, negative head CT.   SUBJECTIVE/OVERNIGHT/INTERVAL HX No improvement from a respiratory standpoint.  Fall overnight as above.  Family meeting today.  VITAL SIGNS: Temp:  [96.8 F (36 C)-97.7 F (36.5 C)] 97.6 F (36.4 C) (08/12 0740) Pulse Rate:  [69-80] 77 (08/12 0740) Resp:  [20-28] 25 (08/12 0740) BP: (113-137)/(43-55) 136/43 mmHg (08/12 0740) SpO2:  [93 %-100 %] 93 % (08/12 0740)  FIO2@  NRM with no flap on  HEMODYNAMICS:   VENTILATOR SETTINGS:   INTAKE / OUTPUT:  Intake/Output Summary (Last 24 hours) at 04/03/14 1158 Last data filed at 04/02/14 2000  Gross per 24 hour  Intake    312 ml  Output  500 ml  Net   -188 ml    PHYSICAL EXAMINATION: General:  Elderly white male, sitting up in bed uncomfortable Neuro:  Awake, oriented, MAEW HEENT: NCAT, EOMs intact, no JVD Cardiovascular: Tachy, regular, no MGR Lungs:  Diminished bs bases Abdomen:  Soft, no tenderness to palpation Musculoskeletal:  Left leg colder than right leg but no  complaints. Skin:  Intact  Ext: No edema  LABS:  PULMONARY No results found for this basename: PHART, PCO2, PCO2ART, PO2, PO2ART, HCO3, TCO2, O2SAT,  in the last 168 hours  CBC  Recent Labs Lab 04/01/14 0725 04/02/14 0235 04/03/14 0540  HGB 9.1* 9.1* 8.2*  HCT 26.6* 26.7* 24.1*  WBC 17.2* 19.1* 22.2*  PLT 295 310 283    COAGULATION No results found for this basename: INR,  in the last 168 hours  CARDIAC    Recent Labs Lab 03/30/14 1100  TROPONINI 0.34*    Recent Labs Lab 03/29/14 0500 03/30/14 0420  PROBNP 3393.0* 2615.0*     CHEMISTRY  Recent Labs Lab 03/30/14 0420 03/31/14 0255 04/01/14 0725 04/02/14 0235 04/03/14 0540  NA 135* 132* 138 133* 132*  K 4.2 4.2 4.2 4.6 4.8  CL 96 91* 95* 92* 91*  CO2 28 30 34* 31 29  GLUCOSE 225* 178* 123* 160* 274*  BUN 44* 42* 38* 41* 42*  CREATININE 1.31 1.26 1.11 1.07 1.11  CALCIUM 8.4 8.4 8.7 8.7 8.5  MG 2.1 2.0 2.0 2.2 2.2  PHOS 2.8 3.0 3.1 3.3 4.5   Estimated Creatinine Clearance: 45 ml/min (by C-G formula based on Cr of 1.11).   LIVER No results found for this basename: AST, ALT, ALKPHOS, BILITOT, PROT, ALBUMIN, INR,  in the last 168 hours   INFECTIOUS No results found for this basename: LATICACIDVEN, PROCALCITON,  in the last 168 hours   ENDOCRINE CBG (last 3)   Recent Labs  04/02/14 2311 04/03/14 0330 04/03/14 0737  GLUCAP 72 229* 214*   IMAGING x48h  Ct Head Wo Contrast  04/03/2014   CLINICAL DATA:  Found on floor.  Assess for head injury.  EXAM: CT HEAD WITHOUT CONTRAST  TECHNIQUE: Contiguous axial images were obtained from the base of the skull through the vertex without intravenous contrast.  COMPARISON:  None.  FINDINGS: There is no evidence of acute infarction, mass lesion, or intra- or extra-axial hemorrhage on CT.  Prominence of the ventricles and sulci reflects mild to moderate cortical volume loss. Scattered periventricular white matter change likely reflects small vessel ischemic  microangiopathy.  The brainstem and fourth ventricle are within normal limits. The basal ganglia are unremarkable in appearance. The cerebral hemispheres demonstrate grossly normal gray-white differentiation. No mass effect or midline shift is seen.  There is no evidence of fracture; visualized osseous structures are unremarkable in appearance. The orbits are within normal limits. There is near-complete opacification of the left mastoid air cells. The paranasal sinuses and right mastoid air cells are well-aerated. No significant soft tissue abnormalities are seen.  IMPRESSION: 1. No evidence of traumatic intracranial injury or fracture. 2. Mild to moderate cortical volume loss and scattered small vessel ischemic microangiopathy. 3. Near complete opacification of the left mastoid air cells.   Electronically Signed   By: Roanna Raider M.D.   On: 04/03/2014 05:02   Dg Chest Port 1 View  04/03/2014   CLINICAL DATA:  Difficulty breathing  EXAM: PORTABLE CHEST - 1 VIEW  COMPARISON:  April 02, 2014  FINDINGS: Central catheter tip is in the right  atrium, stable. No pneumothorax. There is widespread interstitial and alveolar edema, stable. Heart is enlarged. The pulmonary vascularity shows evidence of pulmonary venous hypertension. No adenopathy is appreciable.  IMPRESSION: No pneumothorax. Widespread interstitial and patchy alveolar edema. The appearance is indicative of a degree of congestive heart failure. There may well be superimposed ARDS. No new opacity is identified compared to the study 1 day prior.   Electronically Signed   By: Bretta BangWilliam  Woodruff M.D.   On: 04/03/2014 08:12   Dg Chest Port 1 View  04/02/2014   CLINICAL DATA:  Shortness of breath  EXAM: PORTABLE CHEST - 1 VIEW  COMPARISON:  03/30/2014  FINDINGS: Cardiac shadow is stable. A right-sided PICC line is again seen and stable. Diffuse bilateral infiltrates are again noted and stable. No new focal abnormality is seen. No bony abnormality is noted.   IMPRESSION: No change from the prior exam.   Electronically Signed   By: Alcide CleverMark  Lukens M.D.   On: 04/02/2014 08:04   ASSESSMENT / PLAN: PULMONOLOGY: A: Baseline June 2015 CT abd done for weight loss - does show undiagnosed ILD findings   - likely UIP/IPF based on age and basal predominance though there is R > L assyemtry  Currently: Acute hypoxemic respiratory failure developed 48h post admission   - DDx : IPF flare following angiography (possible diagnosis, some 5%-10% of IPF diagnosed like this) with Acute systolic CHF/pulm edema, alveolar hemorrhage (received TNK). Other resp etiologies include Alveolar Hge (had hemoptysis while on lytics), BOOP,  ALI/ARDS. Virual exacerbation     -On 03/29/14: much improved and [facility] to [facility] after IV steroids and daily lasix > worse 03/30/14               -On 8-10 on NRB 70% with desaturation with any stress. P: - Full auto-immune panel negative, at this point likely inflammatory changes and occupational history suggestive of IPF. - Respiratory viral panel pending. - Increase solumedrol to 80 mg IV q6 hours. - Change lasix to IV 40 mg IV q6 x3 doses. - Supplemental oxygen, Goal SpO2>=88 - ABX broad spectrum ( see infectious) - Bronchodilators PRN  CARDIOLOGY: A: NSTEMI,  This admit with Acute CHF exac, LVEF 30-35%, anterior akinesis,   P: - Heparin,IV gtt per cards and ASA per cards, if will continue needs to be changed to coumadin today but will defer to cards.  I would recommend no anti-coag given fall risk.   INFECTIOUS: A: HCAP? P: Levaquin 8/3>> 8/5 Zosyn 8/3 >> 8/9  Vanco 8/3 >>   8/6>>>8/11>>> Cefepime 8/11>>> - Repeated cultures 8/11, not final. - Restarted broad spectrum abx with cefepime and vanc.  HEMATOLOGY: A: Anemia, acute on chronic disease( baseline 10.3) > [facility]? And critical illness  - No clear gi loss, esophageal lesion on CT - since ? IHD ideally and poor oxygenation need to keep hgb over 9 ideally - transfused one  unit am 8/8 but did not get 1 gm increase so still may be low grade blood loss on hep but not obvious   RENAL: A: AKI > pre-renal? Contrast? Elevated BUN from steroids?    Lab Results  Component Value Date   CREATININE 1.11 04/03/2014   CREATININE 1.07 04/02/2014   CREATININE 1.11 04/01/2014     P: - Diuresis as tol   GI: A: Distal esophageal wall thickening- ??incomplete distension, esophagitis, mass (Scl-70 is negative this admit)  P: - Consulted GI for possible endoscopy after oxygenation improves - Hemoccult blood   -  Advance diet - Stool softeners - Added high dose ppi 03/30/14   ENDOCRINE: A: Hx DM P: - SSI   NEURO A: intact but has some pain nos P Monitor Oxycodone prn  Left lower ext colder, will order an arterial doppler today.  GLOBAL: 03/27/14: Discussed with son / patient >>> they are agreeable to short term ventilatory support if required. Would not be able to tolerate FOB unless intubated - would defer for now  03/28/14: D/w son and daughter. Try empiric steroids.    03/29/14: improved. Updated patient and daughter and d/w Dr Hart [place]. Move to SDU. PCCM primary. Continue steroids and ACS.CHF Rx. PCCM followup opd needed. Get PT  03/31/14 discussed with pt/ son very limited options at this point   8/10 for possible LHC per cards, doubt currently he could tolerate cath without intubation.  8/12 Extensive family meeting, informed family that the patient has no reasonable chance at cure from a pulmonary standpoint, I would recommend no cardiac catheterization at this point, goal should be to get him to manageable amount of oxygen and send patient home with palliative care medicine after making DNR.  That is also the family's wishes.  Will consult palliative care, titrate o2 down as able, increase steroids and diureses then hopefully send home over the next few days with comfort measures at home.  CC time 45 min.  Alyson Reedy, M.D. [facility] Pulmonary/Critical  Care Medicine. Pager: [phone]. After hours pager: [phone].  04/03/2014 11:58 AM

## 2014-04-04 ENCOUNTER — Inpatient Hospital Stay (HOSPITAL_COMMUNITY): Payer: Medicare Other

## 2014-04-04 DIAGNOSIS — J9600 Acute respiratory failure, unspecified whether with hypoxia or hypercapnia: Secondary | ICD-10-CM

## 2014-04-04 DIAGNOSIS — A048 Other specified bacterial intestinal infections: Secondary | ICD-10-CM | POA: Diagnosis present

## 2014-04-04 DIAGNOSIS — Z66 Do not resuscitate: Secondary | ICD-10-CM

## 2014-04-04 DIAGNOSIS — J84112 Idiopathic pulmonary fibrosis: Secondary | ICD-10-CM

## 2014-04-04 LAB — BASIC METABOLIC PANEL
Anion gap: 13 (ref 5–15)
BUN: 52 mg/dL — ABNORMAL HIGH (ref 6–23)
CALCIUM: 8.7 mg/dL (ref 8.4–10.5)
CO2: 29 meq/L (ref 19–32)
CREATININE: 1.4 mg/dL — AB (ref 0.50–1.35)
Chloride: 89 mEq/L — ABNORMAL LOW (ref 96–112)
GFR calc Af Amer: 54 mL/min — ABNORMAL LOW (ref >90)
GFR calc non Af Amer: 47 mL/min — ABNORMAL LOW (ref >90)
Glucose, Bld: 163 mg/dL — ABNORMAL HIGH (ref 70–99)
Potassium: 5.1 mEq/L (ref 3.7–5.3)
Sodium: 131 mEq/L — ABNORMAL LOW (ref 137–147)

## 2014-04-04 LAB — MAGNESIUM: Magnesium: 2.2 mg/dL (ref 1.5–2.5)

## 2014-04-04 LAB — GLUCOSE, CAPILLARY
GLUCOSE-CAPILLARY: 157 mg/dL — AB (ref 70–99)
GLUCOSE-CAPILLARY: 190 mg/dL — AB (ref 70–99)
Glucose-Capillary: 118 mg/dL — ABNORMAL HIGH (ref 70–99)
Glucose-Capillary: 154 mg/dL — ABNORMAL HIGH (ref 70–99)
Glucose-Capillary: 164 mg/dL — ABNORMAL HIGH (ref 70–99)
Glucose-Capillary: 177 mg/dL — ABNORMAL HIGH (ref 70–99)
Glucose-Capillary: 234 mg/dL — ABNORMAL HIGH (ref 70–99)

## 2014-04-04 LAB — HEPARIN LEVEL (UNFRACTIONATED): Heparin Unfractionated: <0.1 IU/mL — ABNORMAL LOW (ref 0.30–0.70)

## 2014-04-04 LAB — CBC
HCT: 23 % — ABNORMAL LOW (ref 39.0–52.0)
Hemoglobin: 7.9 g/dL — ABNORMAL LOW (ref 13.0–17.0)
MCH: 30.2 pg (ref 26.0–34.0)
MCHC: 34.3 g/dL (ref 30.0–36.0)
MCV: 87.8 fL (ref 78.0–100.0)
PLATELETS: 287 10*3/uL (ref 150–400)
RBC: 2.62 MIL/uL — ABNORMAL LOW (ref 4.22–5.81)
RDW: 15.5 % (ref 11.5–15.5)
WBC: 24.2 10*3/uL — ABNORMAL HIGH (ref 4.0–10.5)

## 2014-04-04 LAB — PHOSPHORUS: Phosphorus: 5.1 mg/dL — ABNORMAL HIGH (ref 2.3–4.6)

## 2014-04-04 MED ORDER — FUROSEMIDE 40 MG PO TABS
40.0000 mg | ORAL_TABLET | Freq: Every day | ORAL | Status: DC
  Administered 2014-04-04: 40 mg via ORAL
  Filled 2014-04-04 (×2): qty 1

## 2014-04-04 MED ORDER — ONDANSETRON 8 MG PO TBDP
8.0000 mg | ORAL_TABLET | Freq: Two times a day (BID) | ORAL | Status: DC
  Administered 2014-04-04 – 2014-04-05 (×2): 8 mg via ORAL
  Filled 2014-04-04 (×3): qty 1

## 2014-04-04 MED ORDER — GI COCKTAIL ~~LOC~~
30.0000 mL | Freq: Three times a day (TID) | ORAL | Status: DC | PRN
  Filled 2014-04-04: qty 30

## 2014-04-04 MED ORDER — LEVOFLOXACIN 750 MG PO TABS
750.0000 mg | ORAL_TABLET | ORAL | Status: DC
  Filled 2014-04-04: qty 1

## 2014-04-04 MED ORDER — VANCOMYCIN HCL IN DEXTROSE 750-5 MG/150ML-% IV SOLN: 750.0000 mg | INTRAVENOUS | Status: DC

## 2014-04-04 MED ORDER — ONDANSETRON HCL 4 MG/2ML IJ SOLN
4.0000 mg | Freq: Four times a day (QID) | INTRAMUSCULAR | Status: DC | PRN
Start: 2014-04-04 — End: 2014-04-05
  Administered 2014-04-04 (×3): 4 mg via INTRAVENOUS
  Filled 2014-04-04 (×2): qty 2

## 2014-04-04 MED ORDER — ONDANSETRON HCL 4 MG/2ML IJ SOLN
INTRAMUSCULAR | Status: AC
  Administered 2014-04-04: 4 mg via INTRAVENOUS
  Filled 2014-04-04: qty 2

## 2014-04-04 MED ORDER — IPRATROPIUM-ALBUTEROL 0.5-2.5 (3) MG/3ML IN SOLN
3.0000 mL | Freq: Three times a day (TID) | RESPIRATORY_TRACT | Status: DC
  Administered 2014-04-04 – 2014-04-05 (×4): 3 mL via RESPIRATORY_TRACT
  Filled 2014-04-04 (×4): qty 3

## 2014-04-04 MED ORDER — METOCLOPRAMIDE HCL 5 MG/ML IJ SOLN
5.0000 mg | Freq: Two times a day (BID) | INTRAMUSCULAR | Status: DC
  Administered 2014-04-04: 5 mg via INTRAVENOUS
  Filled 2014-04-04 (×3): qty 1

## 2014-04-04 MED ORDER — LEVOFLOXACIN 500 MG PO TABS: 500.0000 mg | ORAL_TABLET | Freq: Every day | ORAL | Status: DC

## 2014-04-04 MED ORDER — AMOXICILLIN 500 MG PO CAPS
1000.0000 mg | ORAL_CAPSULE | Freq: Two times a day (BID) | ORAL | Status: DC
  Administered 2014-04-04 – 2014-04-05 (×2): 1000 mg via ORAL
  Filled 2014-04-04 (×3): qty 2

## 2014-04-04 NOTE — Progress Notes (Signed)
Palliative Medicine Team Progress Note  Slept better last PM, thinks nebulizers are helping and he is tolerating the oral morphine, some issues with nausea, but these were occuring prior to oral morphine was initiated. He is regurgitating his food and this is a major symptom management issue for him in addition to his dyspnea. He had a very abnormal Esophagus on CT, I called his GI doctor in [place] to make sure we there wasn't a reversible or treatable condition with the esophagus- he dilated his esophagus but didn't take any biopsies- he was however Clo test Positive and the office had been leaving messages for return calls so he could get on treatment for H. Pylori infection.   Goal is still to get Trevor Morris home ASAP with hospice-his pulmonary status is the limiting issues currently- he is requiring full mask O2 at 15L. He bumped his creatine and I suspect he has been diuresed to the max now.   1. Schedule ODT Zofran 2. Continue scheduled SL roxanol low dose 3. Changed his antibiotics to Levaquin and Amoxicillin to cover for pulm pathogens and his H. Pylori infection 4. Will need to stay on twice daily PPI, probably indefinitely. 5. Will try a GI cocktail for his regurgitation and some IV reglan  Hopefully home tomorrow with Hospice- he is very fragile and doesn't want to die in the hospital.  Trevor Malta, DO Palliative Medicine

## 2014-04-04 NOTE — Progress Notes (Signed)
ANTIBIOTIC CONSULT NOTE - FOLLOW UP  Pharmacy Consult for vancomycin Trevor cefepime Indication: HCAP  Allergies  Allergen Reactions  . Prednisone Hives  . Omnipaque [Iohexol] Itching Trevor Rash    Patient Measurements: Height: 5' 5"  (165.1 cm) Weight: 127 lb 13.9 oz (58 kg) IBW/kg (Calculated) : 61.5  Vital Signs: Temp: 97.4 F (36.3 C) (08/13 1152) Temp src: Axillary (08/13 1152) BP: 90/38 mmHg (08/13 0721) Pulse Rate: 77 (08/13 0721) Intake/Output from previous day: 08/12 0701 - 08/13 0700 In: 240 [P.O.:240] Out: 1200 [Urine:1200] Intake/Output from this shift: Total I/O In: 260 [P.O.:240; I.V.:20] Out: 125 [Urine:125]  Labs:  Recent Labs  04/02/14 0235 04/03/14 0540 04/04/14 0450  Trevor 19.1* 22.2* 24.2*  HGB 9.1* 8.2* 7.9*  PLT 310 283 287  CREATININE 1.07 1.11 1.40*   Estimated Creatinine Clearance: 35.7 ml/min (by C-G formula based on Cr of 1.4). No results found for this basename: VANCOTROUGH, Corlis Leak, VANCORANDOM, Pablo, GENTPEAK, GENTRANDOM, TOBRATROUGH, TOBRAPEAK, TOBRARND, AMIKACINPEAK, AMIKACINTROU, AMIKACIN,  in the last 72 hours   Microbiology: Recent Results (from the past 720 hour(s))  MRSA PCR SCREENING     Status: None   Collection Time    03/22/14  9:35 PM      Result Value Ref Range Status   MRSA by PCR NEGATIVE  NEGATIVE Final   Comment:            The GeneXpert MRSA Assay (FDA     approved for NASAL specimens     only), is one component of a     comprehensive MRSA colonization     surveillance program. It is not     intended to diagnose MRSA     infection nor to guide or     monitor treatment for     MRSA infections.  CULTURE, BLOOD (ROUTINE X 2)     Status: None   Collection Time    04/02/14  9:45 AM      Result Value Ref Range Status   Specimen Description BLOOD LEFT HAND   Final   Special Requests BOTTLES DRAWN AEROBIC ONLY 4CC   Final   Culture  Setup Time     Final   Value: 04/02/2014 16:19     Performed at [facility]   Culture     Final   Value:        BLOOD CULTURE RECEIVED NO GROWTH TO DATE CULTURE WILL BE HELD FOR 5 DAYS BEFORE ISSUING A FINAL NEGATIVE REPORT     Performed at [facility]   Report Status PENDING   Incomplete  CULTURE, BLOOD (ROUTINE X 2)     Status: None   Collection Time    04/02/14 10:12 AM      Result Value Ref Range Status   Specimen Description BLOOD LEFT FOREARM   Final   Special Requests BOTTLES DRAWN AEROBIC ONLY 3CC   Final   Culture  Setup Time     Final   Value: 04/02/2014 16:18     Performed at [facility]   Culture     Final   Value:        BLOOD CULTURE RECEIVED NO GROWTH TO DATE CULTURE WILL BE HELD FOR 5 DAYS BEFORE ISSUING A FINAL NEGATIVE REPORT     Performed at [facility]   Report Status PENDING   Incomplete  RESPIRATORY VIRUS PANEL     Status: None   Collection Time    04/02/14  2:23 PM  Result Value Ref Range Status   Source - RVPAN NASAL SWAB   Corrected   Comment: CORRECTED ON 08/12 AT 2012: PREVIOUSLY REPORTED AS NASAL SWAB   Respiratory Syncytial Virus A NOT DETECTED   Final   Respiratory Syncytial Virus B NOT DETECTED   Final   Influenza A NOT DETECTED   Final   Influenza B NOT DETECTED   Final   Parainfluenza 1 NOT DETECTED   Final   Parainfluenza 2 NOT DETECTED   Final   Parainfluenza 3 NOT DETECTED   Final   Metapneumovirus NOT DETECTED   Final   Rhinovirus NOT DETECTED   Final   Adenovirus NOT DETECTED   Final   Influenza A H1 NOT DETECTED   Final   Influenza A H3 NOT DETECTED   Final   Comment: (NOTE)           Normal Reference Range for each Analyte: NOT DETECTED     Testing performed using the Luminex xTAG Respiratory Viral Panel test     kit.     The analytical performance characteristics of this assay have been     determined by [facility].  The modifications have not been     cleared or approved by the FDA. This assay has been validated pursuant     to the CLIA regulations Trevor is  used for clinical purposes.     Performed at [facility]   Start     Dose/Rate Route Frequency Ordered Stop   04-26-2014 1000  vancomycin (VANCOCIN) IVPB 750 mg/150 ml premix     750 mg 150 mL/hr over 60 Minutes Intravenous Every 24 hours 04/04/14 1133     04/02/14 2200  vancomycin (VANCOCIN) 500 mg in sodium chloride 0.9 % 100 mL IVPB  Status:  Discontinued     500 mg 100 mL/hr over 60 Minutes Intravenous Every 12 hours 04/02/14 0921 04/04/14 1133   04/02/14 1000  vancomycin (VANCOCIN) IVPB 1000 mg/200 mL premix     1,000 mg 200 mL/hr over 60 Minutes Intravenous NOW 04/02/14 0920 04/02/14 1216   04/02/14 1000  ceFEPIme (MAXIPIME) 1 g in dextrose 5 % 50 mL IVPB     1 g 100 mL/hr over 30 Minutes Intravenous Every 24 hours 04/02/14 0920     03/26/14 1400  piperacillin-tazobactam (ZOSYN) IVPB 3.375 g  Status:  Discontinued     3.375 g 12.5 mL/hr over 240 Minutes Intravenous Every 8 hours 03/26/14 1152 03/31/14 1248   03/26/14 0100  vancomycin (VANCOCIN) 500 mg in sodium chloride 0.9 % 100 mL IVPB  Status:  Discontinued     500 mg 100 mL/hr over 60 Minutes Intravenous Every 12 hours 03/25/14 1207 03/28/14 0941   03/25/14 1300  vancomycin (VANCOCIN) IVPB 1000 mg/200 mL premix     1,000 mg 200 mL/hr over 60 Minutes Intravenous  Once 03/25/14 1207 03/25/14 1408   03/25/14 1200  levofloxacin (LEVAQUIN) IVPB 750 mg  Status:  Discontinued     750 mg 100 mL/hr over 90 Minutes Intravenous Every 24 hours 03/25/14 1100 03/27/14 0931   03/25/14 1100  piperacillin-tazobactam (ZOSYN) IVPB 3.375 g  Status:  Discontinued     3.375 g 12.5 mL/hr over 240 Minutes Intravenous Every 8 hours 03/25/14 1052 03/26/14 1152   03/22/14 0600  ceFAZolin (ANCEF) IVPB 1 g/50 mL premix     1 g 100 mL/hr over 30 Minutes Intravenous  Once 03/21/14 1808 03/22/14 0847  Assessment: 80 y/o male with acute occlusion of left femoral-below-knee popliteal bypass s/p lysis with TNKase  (Morris31-8/2).   Broad spectrum antibiotics resumed for HCAP coverage on 8/11. SCr bumped up to 1.4 today. He is Morris, Trevor Morris, Trevor Morris31 Vanc 8/3>>8/6; 8/11>> Zosyn 8/3>>8/9 Levaquin 8/3>>8/4  Cefepime 8/11>>  Sputum Resp virus panel neg 8/11 BCx2 - ngtd  Goal of Therapy:  Vancomycin trough level 15-20 mcg/ml  Plan:  - Decrease vancomycin to 750 mg IV q24h - Continue cefepime 1 g IV q24h - Plan is to change to Levaquin upon discharge  [facility], [place].D., BCPS Clinical Pharmacist Pager: [phone] 04/04/2014 12:41 PM

## 2014-04-04 NOTE — Progress Notes (Signed)
Utilization review completed.  

## 2014-04-04 NOTE — Progress Notes (Signed)
Respiratory therapy note- Patient placed on ordered oxymizer at 6-8L/min, sp02 79-82%. RN aware and notifying MD, PRB placed back and sp02 now 91%. Daughter at bedside.

## 2014-04-04 NOTE — Progress Notes (Signed)
PULMONARY / CRITICAL CARE MEDICINE  Name: Trevor Morris MRN: [id number] DOB: Jun 06, 1934    ADMISSION DATE:  03/21/2014 CONSULTATION DATE: Lillia [place]  REFERRING MD :  Darrick Penna   CHIEF COMPLAINT:  Acute hypoxic respiratory failure   INITIAL PRESENTATION:  80 year old male w/ known h/o CM allergy. Underwent angiography and TNK administration for lysis of distal clot at his by-pass graft site on 7/31. His post-op course was complicated by episodes of scant hemoptysis. First noted 8/1 and continued so TNK was eventually stopped. He went to arteriogram 3 times total between 7/31 and 8/2. On 8/2 he began to have significant increase in dyspnea and by the time he returned to the SICU after angiogram required 100% NRB. He received lasix and supportive care. PCCM asked to see 8/3 as symptoms had continued to worsen.   Reports that he quit smoking about 26 years ago.  LINES/TUBES: PICC 8/3>>  SIGNIFICANT EVENTS: 7/30 admitted for clotted fem-pop bypass graft.   7/31: went to IR for angiogram and TNK.   - 7/31: arteriogram: Pelvic angiography demonstrates atherosclerotic plaque at the origin of the right and left common iliac arteries without narrowing greater than 50%. The left external iliac artery is patent. Successful placement of an infusion catheter into a thrombosed left femoral popliteal artery bypass graft. TNK was instituted at 0.5 milligrams/hour  8/1, 230 p: TNK stopped due to scant hemoptysis. Resumed at 5p. 1/2 dose   - 8/1: arteriogram: improvement with near complete lysed cysts of the saphenous vein graft. There continues to be occlusive thrombus distally. The plan is for an additional 24 hr of lysis with a follow-up check 8/2  - 8/1 630: hemoptysis returned and was not resumed. Heparin continued.  8/2: increased shortness of breath.   - 8/2 third trip to angiogram: demonstrates resolution of the thrombus throughout the fem-pop venous bypass graft but 2 areas of narrowing distally, at the  distal anastomosis, and within the tibioperoneal trunk.   - 8/2: placed on 100% NRB after returning from angiogram.   8/3 PCCM called. Still very hypoxic on 100% NRB. Had episode where sats dropped to 70s. Heparin stopped due to some episode of hemoptysis.   - 8/3 CT chest: 1. Extensive bilateral acute interstitial lung disease superimposed on marked changes of COPD. (pulmonary edema vs viral pneumonitis). Small right pleural effusion. Mild mediastinal adenopathy. Dense coronary artery atheromatous calcifications. There is also diffuse distal esophageal wall thickening. This could be due to incomplete distention, esophagitis or mass. These possibilities could be differentiated with direct endoscopic visualization  8/4 diuresis, cards consulted,  - 8/4 echo> LVEF 30-35%, akinesis of anterior wall, grade 2 diastolic dysfunction, PA pressure , RV mildly reduced function    8/10 remains on NRB with 70% fio2  8/11: fall overnight, negative head CT.   SUBJECTIVE/OVERNIGHT/INTERVAL HX No improvement from a respiratory standpoint.  Fall overnight as above.  Family meeting today.  VITAL SIGNS: Temp:  [96.9 F (36.1 C)-98.3 F (36.8 C)] 97.7 F (36.5 C) (08/13 0731) Pulse Rate:  [73-88] 77 (08/13 0721) Resp:  [15-23] 15 (08/13 0721) BP: (90-129)/(38-81) 90/38 mmHg (08/13 0721) SpO2:  [93 %-100 %] 97 % (08/13 0721)  FIO2@  NRM with no flap on  HEMODYNAMICS:   VENTILATOR SETTINGS:   INTAKE / OUTPUT:  Intake/Output Summary (Last 24 hours) at 04/04/14 1032 Last data filed at 04/04/14 0727  Gross per 24 hour  Intake    240 ml  Output   1175 ml  Net   -  935 ml    PHYSICAL EXAMINATION: General:  Elderly white male, sitting up in bed uncomfortable Neuro:  Awake, oriented, MAEW HEENT: NCAT, EOMs intact, no JVD Cardiovascular: Tachy, regular, no MGR Lungs:  Diminished bs bases Abdomen:  Soft, no tenderness to palpation Musculoskeletal:  Left leg colder than right leg but no  complaints. Skin:  Intact  Ext: No edema  LABS:  PULMONARY No results found for this basename: PHART, PCO2, PCO2ART, PO2, PO2ART, HCO3, TCO2, O2SAT,  in the last 168 hours  CBC  Recent Labs Lab 04/02/14 0235 04/03/14 0540 04/04/14 0450  HGB 9.1* 8.2* 7.9*  HCT 26.7* 24.1* 23.0*  WBC 19.1* 22.2* 24.2*  PLT 310 283 287   COAGULATION No results found for this basename: INR,  in the last 168 hours  CARDIAC   Recent Labs Lab 03/30/14 1100  TROPONINI 0.34*    Recent Labs Lab 03/29/14 0500 03/30/14 0420  PROBNP 3393.0* 2615.0*     CHEMISTRY  Recent Labs Lab 03/31/14 0255 04/01/14 0725 04/02/14 0235 04/03/14 0540 04/04/14 0450  NA 132* 138 133* 132* 131*  K 4.2 4.2 4.6 4.8 5.1  CL 91* 95* 92* 91* 89*  CO2 30 34* 31 29 29   GLUCOSE 178* 123* 160* 274* 163*  BUN 42* 38* 41* 42* 52*  CREATININE 1.26 1.11 1.07 1.11 1.40*  CALCIUM 8.4 8.7 8.7 8.5 8.7  MG 2.0 2.0 2.2 2.2 2.2  PHOS 3.0 3.1 3.3 4.5 5.1*   Estimated Creatinine Clearance: 35.7 ml/min (by C-G formula based on Cr of 1.4).   LIVER No results found for this basename: AST, ALT, ALKPHOS, BILITOT, PROT, ALBUMIN, INR,  in the last 168 hours   INFECTIOUS No results found for this basename: LATICACIDVEN, PROCALCITON,  in the last 168 hours   ENDOCRINE CBG (last 3)   Recent Labs  04/03/14 2349 04/04/14 0407 04/04/14 0717  GLUCAP 154* 164* 157*   IMAGING x48h  Ct Head Wo Contrast  04/03/2014   CLINICAL DATA:  Found on floor.  Assess for head injury.  EXAM: CT HEAD WITHOUT CONTRAST  TECHNIQUE: Contiguous axial images were obtained from the base of the skull through the vertex without intravenous contrast.  COMPARISON:  None.  FINDINGS: There is no evidence of acute infarction, mass lesion, or intra- or extra-axial hemorrhage on CT.  Prominence of the ventricles and sulci reflects mild to moderate cortical volume loss. Scattered periventricular white matter change likely reflects small vessel  ischemic microangiopathy.  The brainstem and fourth ventricle are within normal limits. The basal ganglia are unremarkable in appearance. The cerebral hemispheres demonstrate grossly normal gray-white differentiation. No mass effect or midline shift is seen.  There is no evidence of fracture; visualized osseous structures are unremarkable in appearance. The orbits are within normal limits. There is near-complete opacification of the left mastoid air cells. The paranasal sinuses and right mastoid air cells are well-aerated. No significant soft tissue abnormalities are seen.  IMPRESSION: 1. No evidence of traumatic intracranial injury or fracture. 2. Mild to moderate cortical volume loss and scattered small vessel ischemic microangiopathy. 3. Near complete opacification of the left mastoid air cells.   Electronically Signed   By: Roanna Raider M.D.   On: 04/03/2014 05:02   Dg Chest Port 1 View  04/03/2014   CLINICAL DATA:  Difficulty breathing  EXAM: PORTABLE CHEST - 1 VIEW  COMPARISON:  April 02, 2014  FINDINGS: Central catheter tip is in the right atrium, stable. No pneumothorax. There is widespread interstitial  and alveolar edema, stable. Heart is enlarged. The pulmonary vascularity shows evidence of pulmonary venous hypertension. No adenopathy is appreciable.  IMPRESSION: No pneumothorax. Widespread interstitial and patchy alveolar edema. The appearance is indicative of a degree of congestive heart failure. There may well be superimposed ARDS. No new opacity is identified compared to the study 1 day prior.   Electronically Signed   By: Bretta Bang M.D.   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 Other resp etiologies include Alveolar Hge (had hemoptysis while on lytics), BOOP,  ALI/ARDS. Virual exacerbation     -On 03/29/14: much improved and down to [facility] after IV steroids and daily lasix > worse 03/30/14               -On 8-10 on NRB 70% with desaturation with any stress. P: - Full auto-immune panel negative, at this point likely inflammatory changes and occupational history suggestive of IPF. - Respiratory viral panel negative. - Increased solumedrol to 80 mg IV q6 hours, will continue for today then change to prednisone 40 mg PO daily in AM with taper over the next 2 wks. - Lasix to 40 mg PO daily. - Supplemental oxygen, Goal SpO2>=88 - ABX broad spectrum ( see infectious) - Bronchodilators PRN  CARDIOLOGY: A: NSTEMI,  This admit with Acute CHF exac, LVEF 30-35%, anterior akinesis,   P: - D/C anticoagulation. - No indication for cath at this time.   INFECTIOUS: A: HCAP? P: Levaquin 8/3>> 8/5 Zosyn 8/3 >> 8/9  Vanco 8/3 >>   8/6>>>8/11>>> Cefepime 8/11>>> - Repeated cultures 8/11, not final. - Restarted broad spectrum abx with cefepime and vanc.  HEMATOLOGY: A: Anemia, acute on chronic disease( baseline 10.3) > due to [facility]? And critical illness  - No clear gi loss, esophageal lesion on CT - since ? IHD ideally and poor oxygenation need to keep hgb over 9 ideally - transfused one unit am 8/8 but did not get 1 gm increase so still may be low grade blood loss on hep but not obvious   RENAL: A: AKI > pre-renal? Contrast? Elevated BUN from steroids?    Lab Results  Component Value Date   CREATININE 1.40* 04/04/2014   CREATININE 1.11 04/03/2014   CREATININE 1.07 04/02/2014     P: - Diuresis as tol   GI: A: Distal esophageal wall thickening- ??incomplete distension, esophagitis, mass (Scl-70 is negative this admit)  P: -  Hemoccult blood. - Advance diet. - Stool softeners.  ENDOCRINE: A: Hx DM P: - SSI  NEURO A: intact but has some pain nos P Monitor Oxycodone prn  Left lower ext colder, will order an arterial doppler today.  GLOBAL: 03/27/14: Discussed with son / patient >>> they are agreeable to short term ventilatory support if required. Would not be able to tolerate FOB unless intubated - would defer for now  03/28/14: D/w son and daughter. Try empiric steroids.    03/29/14: improved. Updated patient and daughter and d/w Dr Hart [place]. Move to SDU. PCCM primary. Continue steroids and ACS.CHF Rx. PCCM followup opd needed. Get PT  03/31/14 discussed with pt/ son very limited options at this point   8/10 for possible LHC per cards, doubt currently he could tolerate cath without intubation.  8/12 Extensive family meeting, informed family that the patient has no reasonable chance at cure from a pulmonary standpoint, I would recommend no cardiac catheterization at this point, goal should be to get him to manageable amount of oxygen and send patient home with palliative care medicine after making DNR.  That is also the family's wishes.  Will consult palliative care, titrate o2 down as able, increase steroids and diureses then hopefully send home over the next few days with comfort measures at home.  Will continue high dose steroids for now, DNR status, change lasix to PO, likely to go home tomorrow if O2 demand is manageable.  Alyson ReedyWesam G. Yacoub, M.D. [facility]. Pager: [phone]. After hours pager: [phone].  04/04/2014 10:32 AM

## 2014-04-05 LAB — CBC
HEMATOCRIT: 22.1 % — AB (ref 39.0–52.0)
HEMOGLOBIN: 7.6 g/dL — AB (ref 13.0–17.0)
MCH: 30.8 pg (ref 26.0–34.0)
MCHC: 34.4 g/dL (ref 30.0–36.0)
MCV: 89.5 fL (ref 78.0–100.0)
Platelets: 262 10*3/uL (ref 150–400)
RBC: 2.47 MIL/uL — ABNORMAL LOW (ref 4.22–5.81)
RDW: 15.6 % — ABNORMAL HIGH (ref 11.5–15.5)
WBC: 29.2 10*3/uL — ABNORMAL HIGH (ref 4.0–10.5)

## 2014-04-05 LAB — BASIC METABOLIC PANEL
Anion gap: 12 (ref 5–15)
BUN: 66 mg/dL — ABNORMAL HIGH (ref 6–23)
CALCIUM: 8.5 mg/dL (ref 8.4–10.5)
CO2: 29 mEq/L (ref 19–32)
Chloride: 87 mEq/L — ABNORMAL LOW (ref 96–112)
Creatinine, Ser: 1.85 mg/dL — ABNORMAL HIGH (ref 0.50–1.35)
GFR calc Af Amer: 39 mL/min — ABNORMAL LOW (ref >90)
GFR, EST NON AFRICAN AMERICAN: 33 mL/min — AB (ref >90)
GLUCOSE: 160 mg/dL — AB (ref 70–99)
Potassium: 5.4 mEq/L — ABNORMAL HIGH (ref 3.7–5.3)
Sodium: 128 mEq/L — ABNORMAL LOW (ref 137–147)

## 2014-04-05 LAB — GLUCOSE, CAPILLARY
Glucose-Capillary: 138 mg/dL — ABNORMAL HIGH (ref 70–99)
Glucose-Capillary: 209 mg/dL — ABNORMAL HIGH (ref 70–99)

## 2014-04-05 LAB — MAGNESIUM: Magnesium: 2.2 mg/dL (ref 1.5–2.5)

## 2014-04-05 LAB — PHOSPHORUS: PHOSPHORUS: 5.3 mg/dL — AB (ref 2.3–4.6)

## 2014-04-05 MED ORDER — OMEPRAZOLE 40 MG PO CPDR: 40.0000 mg | DELAYED_RELEASE_CAPSULE | Freq: Two times a day (BID) | ORAL | Status: AC

## 2014-04-05 MED ORDER — CARVEDILOL 3.125 MG PO TABS: 3.1250 mg | ORAL_TABLET | Freq: Two times a day (BID) | ORAL | Status: AC

## 2014-04-05 MED ORDER — PREDNISONE 20 MG PO TABS: 40.0000 mg | ORAL_TABLET | Freq: Every day | ORAL | Status: AC

## 2014-04-05 MED ORDER — ASPIRIN 325 MG PO TBEC: 325.0000 mg | DELAYED_RELEASE_TABLET | Freq: Every day | ORAL | Status: AC

## 2014-04-05 MED ORDER — FUROSEMIDE 40 MG PO TABS: 40.0000 mg | ORAL_TABLET | Freq: Every day | ORAL | Status: DC

## 2014-04-05 MED ORDER — TAMSULOSIN HCL 0.4 MG PO CAPS: 0.4000 mg | ORAL_CAPSULE | Freq: Every day | ORAL | Status: AC

## 2014-04-05 MED ORDER — AMOXICILLIN 500 MG PO CAPS: 1000.0000 mg | ORAL_CAPSULE | Freq: Two times a day (BID) | ORAL | Status: AC

## 2014-04-05 MED ORDER — ALBUTEROL SULFATE (2.5 MG/3ML) 0.083% IN NEBU: 2.5000 mg | INHALATION_SOLUTION | RESPIRATORY_TRACT | Status: AC | PRN

## 2014-04-05 MED ORDER — GLUCERNA SHAKE PO LIQD: 237.0000 mL | Freq: Three times a day (TID) | ORAL | Status: AC

## 2014-04-05 MED ORDER — LEVOFLOXACIN 750 MG PO TABS: 750.0000 mg | ORAL_TABLET | ORAL | Status: AC

## 2014-04-05 MED ORDER — IPRATROPIUM-ALBUTEROL 0.5-2.5 (3) MG/3ML IN SOLN: 3.0000 mL | Freq: Three times a day (TID) | RESPIRATORY_TRACT | Status: AC

## 2014-04-05 MED ORDER — ONDANSETRON 8 MG PO TBDP: 8.0000 mg | ORAL_TABLET | Freq: Two times a day (BID) | ORAL | Status: AC

## 2014-04-05 MED ORDER — SODIUM CHLORIDE 0.9 % IV BOLUS (SEPSIS)
500.0000 mL | Freq: Once | INTRAVENOUS | Status: AC
  Administered 2014-04-05: 500 mL via INTRAVENOUS

## 2014-04-05 MED ORDER — MORPHINE SULFATE (CONCENTRATE) 10 MG /0.5 ML PO SOLN: 5.0000 mg | ORAL | Status: AC | PRN

## 2014-04-05 MED ORDER — INSULIN GLARGINE 100 UNIT/ML ~~LOC~~ SOLN: 10.0000 [IU] | Freq: Every day | SUBCUTANEOUS | Status: AC

## 2014-04-05 MED ORDER — SENNOSIDES-DOCUSATE SODIUM 8.6-50 MG PO TABS: 1.0000 | ORAL_TABLET | Freq: Every day | ORAL | Status: AC

## 2014-04-05 MED ORDER — METHYLPREDNISOLONE SODIUM SUCC 125 MG IJ SOLR: 60.0000 mg | Freq: Every day | INTRAMUSCULAR | Status: DC

## 2014-04-05 MED ORDER — SODIUM CHLORIDE 0.9 % IJ SOLN: 3.0000 mL | INTRAMUSCULAR | Status: AC | PRN

## 2014-04-08 LAB — CULTURE, BLOOD (ROUTINE X 2)
CULTURE: NO GROWTH
Culture: NO GROWTH

## 2014-04-17 ENCOUNTER — Telehealth: Payer: Self-pay | Admitting: Vascular Surgery

## 2014-04-17 NOTE — Telephone Encounter (Signed)
Trevor Morris called to inform us that Mr Hopf passed away on 04/18/2014. I have canceled all of his appointments. dpm

## 2014-04-23 NOTE — Progress Notes (Signed)
CSW (Clinical Child psychotherapist) informed pt needs non-emergent ambulance transport home. CSW arranged for transport and made nursing aware. Pt family informed by alternate staff.  Trevor Morris, LCSWA [phone]

## 2014-04-23 NOTE — Care Management Note (Signed)
Page 1 of 2   05/08/2014     11:54:18 AM CARE MANAGEMENT NOTE 05/08/2014  Patient:  Trevor Morris,Trevor Morris   Account Number:  [id number]  Date Initiated:  03/25/2014  Documentation initiated by:  MAYO,HENRIETTA  Subjective/Objective Assessment:   s/p thrombolysis of LLE BPG, requiring PRB 2/2 sats in the 70s     Action/Plan:   Anticipated DC Date:  009/16/2015   Anticipated DC Plan:  HOME W HOSPICE CARE      DC Planning Services  CM consult      PAC Choice  HOSPICE   Choice offered to / List presented to:  C-4 Adult Children   DME arranged  HOSPITAL BED  NEBULIZER MACHINE  OXYGEN  SUCTION  TUB BENCH  GEL OVERLAY      DME agency  [facility]     [facility]  [facility]      [facility]   Status of service:  Completed, signed off Medicare Important Message given?  YES (If response is "NO", the following Medicare IM given date fields will be blank) Date Medicare IM given:  03/25/2014 Medicare IM given by:  MAYO,HENRIETTA Date Additional Medicare IM given:  04/04/2014 Additional Medicare IM given by:  Donn PieriniKRISTI Mike Hamre  Discharge Disposition:  HOME W HOSPICE CARE  Per UR Regulation:  Reviewed for med. necessity/level of care/duration of stay  If discussed at Long Length of Stay Meetings, dates discussed:   03/26/2014  03/28/2014  04/02/2014  04/04/2014    Comments:  Contact:  McKey,Tammy Daughter [phone]                 Palos,Ernestine Spouse [phone]                 Loralie Champagneony Wisz- son- [phone]  2014/08/19- 0930- Donn PieriniKristi Carine Nordgren RN, BSN [phone] Dr. Phillips OdorGolding at bedside- plan is to get pt home ASAP- [facility] Gold DNR on chart, oximizer tubing given to daughter to take home with pt, CSW called for ambulance transport need. Per Olegario [facility]Kathy [facility] delivered concentrator that will go up to 15L. Support given to daughter in transitioning pt home with  hospice-  04/04/14- 1015- Donn PieriniKristi Suan Pyeatt RN BSN [phone] Per Dr Phillips OdorGolding plan is for pt d/c home with hospice - family would like to use [facility]osp[facility] in [place] and Dr. Phillips OdorGolding would like to get oximizer tubing to see if pt can tolerate while he is still here in hospital- spoke with pt and daughter -Tammy at bedside- confirmed [facility]- and discussed DME needs- pt already has w/c, walker, BSC, shower bench at home-  will need hospital bed, 02 with high flow concentrator, nebulizer- plan is for pt to transport home via ambulance once all DME has been delivered- [facility] and paperwork faxed- [facility] so call made to Graham[facility]ermaine ([facility]) to see if they had oximizer tubing- he did not have any in stock here but will have some brought to hospital around noon and bring to room when available. NCM to continue to follow to assist with d/c plan to home with hospice as needed.  update 1600- Donn PieriniKristi Shavonte Zhao RN, BSN -  pt has been tried on oximizer tubing- 02 sats do drop- MD has been notified- plan at this time is to continue to watch pt overnight and make  sure of 02 needs for home Hospice- have confirmed that DME has been delivered to home - am awaiting confirmation from [facility] on how many concentrators have been delivered to home for 02 needs- spoke with Olegario Messier at [facility] and updated on plan to d/c in am   03-28-14 9:54am Avie Arenas, RNBSN - [phone] Remains on 55% VM. BP not tolerating BB. When stable enough hope to cardiac cath.  Talked with PCCM physician about possible [facility]  candidate.  Given permission to check Insurance.  Meets for [facility] level of care for days - updated physician - talked with son and patient - referred my to daughter. Talked with daughter Babette Relic - updated on possble discharge rehab options.  Daughter not sure will need this level, but if he does would prefer [facility].  CM will  continue to follow.  03-27-14 8:30am Avie Arenas, RNBSN  [phone] On 55% VM now.  On lasix drip.  NSTEMI - hbg drop to 6.7 - transfusing.

## 2014-04-23 NOTE — Discharge Summary (Signed)
Patient seen and examined, agree with above note.  I dictated the care and orders written for this patient under my direction.  Wynter Isaacs G Reggie Bise, MD [phone] 

## 2014-04-23 NOTE — Discharge Summary (Signed)
Physician Discharge Summary  Patient ID: Trevor Morris MRN: [id number] DOB/AGE: [birth year] 80 y.o.  Admit date: 03/21/2014 Discharge date: 04/14/2014    Discharge Diagnoses:  Acute Hypoxemic Respiratory failure IPF Flare HCAP NSTEMI Acute CHF Exacerbation Anemia AKI GERD DM Pain DNR/DNI                                                                     DISCHARGE PLAN BY DIAGNOSIS     Acute Hypoxemic Respiratory failure IPF Flare HCAP  Discharge Plan: Continue Duoneb, Albuterol Amoxicillin BID for 7 more days from d/c Levaquin QOD for 7 more days from d/c Prednisone 40 mg, cautiously consider taper  NSTEMI Acute CHF Exacerbation  Discharge Plan: Continue ASA, Coreg  Anemia  Discharge Plan: No further acute intervetions  AKI BPH  Discharge Plan: Continue Flomax Fluids as able   GERD Constipation  Nausea   Discharge Plan: Colace Prilosec PRN zofran  Glucerna  Diet as tolerated, accepting the risk of aspiration   DM  Discharge Plan: SSI   Pain  Discharge Plan: Continue Morphine  DNR / DNI   Discharge Plan: Full comfort measures Hospice & Palliative Care to follow Pain control as above                  DISCHARGE SUMMARY   Trevor Morris is a 80 y.o. y/o male with a PMH of DM, HTN, HLD, Anemia, COPD, IPF, PVD, left femoral to below knee popliteal bypass with saphenous vein graft in 1999 and contrast media allergy who was admitted on 7/30 for leg pain and absence of distal pulses.  He underwent angiography and TNK administration for lysis of distal clot at his by-pass graft site on 7/31. His post-op course was complicated by episodes of scant hemoptysis. First noted 8/1 and continued so TNK was eventually stopped. He went to arteriogram 3 times total between 7/31 and 8/2. On 8/2 he began to have significant increase in dyspnea and by the time he returned to the SICU after angiogram required 100% NRB. Concern for aspiration vs CHF.   He received lasix and supportive care. PCCM asked to see 8/3 as symptoms had continued to worsen. Patient was diuresed without change in clinical symptoms.  He continued to require increased O2 needs.  CT scan of chest demonstrated extensive bilateral acute ILD superimposed on COPD.  Patient was treated with pulse steroids without improvement in clinical status.  Palliative Care was consulted given overall poor prognosis and patient elected for full comfort measures.  See discharge plan as above.     SIGNIFICANT EVENTS: 7/30 admitted for clotted fem-pop bypass graft.  7/31: went to IR for angiogram and TNK.  7/31: arteriogram: Pelvic angiography demonstrates atherosclerotic plaque at the origin of the right and left common iliac arteries without narrowing greater than 50%. The left external iliac artery is patent. Successful placement of an infusion catheter into a thrombosed left femoral popliteal artery bypass graft. TNK was instituted at 0.5 milligrams/hour  8/1, 230 p: TNK stopped due to scant hemoptysis. Resumed at 5p. 1/2 dose  8/1: arteriogram: improvement with near complete lysed cysts of the saphenous vein graft. There continues to be occlusive thrombus distally. The plan is for an additional 24 hr of lysis  with a follow-up check 8/2  8/1 630: hemoptysis returned and was not resumed. Heparin continued.  8/2: increased shortness of breath.  8/2 third trip to angiogram: demonstrates resolution of the thrombus throughout the fem-pop venous bypass graft but 2 areas of narrowing distally, at the distal anastomosis, and within the tibioperoneal trunk.  8/2: placed on 100% NRB after returning from angiogram.  8/3 PCCM called. Still very hypoxic on 100% NRB. Had episode where sats dropped to 70s. Heparin stopped due to some episode of hemoptysis.  8/3 CT chest: Extensive bilateral acute interstitial lung disease superimposed on marked changes of COPD. (pulmonary edema vs viral pneumonitis). Small  right pleural effusion. Mild mediastinal adenopathy. Dense coronary artery atheromatous calcifications. There is also diffuse distal esophageal wall thickening. This could be due to incomplete distention, esophagitis or mass. These possibilities could be differentiated with direct endoscopic visualization  8/4 diuresis, cards consulted,  8/4 echo> LVEF 30-35%, akinesis of anterior wall, grade 2 diastolic dysfunction, PA pressure 67mHg, RV mildly reduced function  8/10 remains on NRB with 70% fio2  8/11 fall overnight, negative head CT.  8/14 Decline in status, hopeful to make it home for EOL    Discharge Exam: General: Elderly white male, sitting up in bed uncomfortable  Neuro: Awake, oriented, MAEW, generalized weakness  HEENT: NCAT, EOMs intact, no JVD  Cardiovascular: Tachy, regular, no MGR  Lungs: Diminished bs bases  Abdomen: Soft, no tenderness to palpation  Musculoskeletal: Left leg colder than right leg but no complaints.  Skin: Intact  Ext: No edema   Filed Vitals:   04/04/14 2307 [id number]  BP: 106/64 93/63    Pulse: 94 101    Temp: 97 F (36.1 C) 96.9 F (36.1 C) 97.6 F (36.4 C)   TempSrc: Axillary Axillary Axillary   Resp: 17 20    Height:      Weight:      SpO2: 93% 90%  87%     Discharge Labs  BMET  Recent Labs Lab 04/01/14 0725 04/02/14 0235 04/03/14 0540 04/04/14 0450 [id number]  NA 138 133* 132* 131* 128*  K 4.2 4.6 4.8 5.1 5.4*  CL 95* 92* 91* 89* 87*  CO2 34* 31 29 29 29   GLUCOSE 123* 160* 274* 163* 160*  BUN 38* 41* 42* 52* 66*  CREATININE 1.11 1.07 1.11 1.40* 1.85*  CALCIUM 8.7 8.7 8.5 8.7 8.5  MG 2.0 2.2 2.2 2.2 2.2  PHOS 3.1 3.3 4.5 5.1* 5.3*    CBC  Recent Labs Lab 04/03/14 0540 04/04/14 0450 [id number]  HGB 8.2* 7.9* 7.6*  HCT 24.1* 23.0* 22.1*  WBC 22.2* 24.2* 29.2*  PLT 283 287 262    Anti-Coagulation No results found for this basename: INR,  in the last 168 hours  Discharge  Instructions   Call MD for:  difficulty breathing, headache or visual disturbances    Complete by:  As directed      Call MD for:  severe uncontrolled pain    Complete by:  As directed      Diet general    Complete by:  As directed   As tolerated     Discharge instructions    Complete by:  As directed   Continue [place]gyen at 15 L [place] care at [place].     Increase activity slowly    Complete by:  As directed      Nursing communication  Complete by:  As directed   Hospice PICC Line Protocol Full Face Mask O2, 1-15 Liters as tolerated                Follow-up Information   Follow up with Tinnie Gens, MD In 4 weeks.   Specialty:  Vascular Surgery   Contact information:   [address] [place] [address] [phone]       Follow up with [facility]. (Home Hospice arranged (DME will be arranged with [facility]))    Specialty:  Home Health Services   Contact information:   [place] [address] [phone]          Medication List    STOP taking these medications       lisinopril 40 MG tablet  Commonly known as:  PRINIVIL,ZESTRIL     metFORMIN 1000 MG tablet  Commonly known as:  GLUCOPHAGE     multivitamin with minerals Tabs tablet     rosuvastatin 40 MG tablet  Commonly known as:  CRESTOR      TAKE these medications       albuterol (2.5 MG/3ML) 0.083% nebulizer solution  Commonly known as:  PROVENTIL  Take 3 mLs (2.5 mg total) by nebulization every 2 (two) hours as needed for wheezing or shortness of breath.     amoxicillin 500 MG capsule  Commonly known as:  AMOXIL  Take 2 capsules (1,000 mg total) by mouth every 12 (twelve) hours.     aspirin 325 MG EC tablet  Take 1 tablet (325 mg total) by mouth daily.     carvedilol 3.125 MG tablet  Commonly known as:  COREG  Take 1 tablet (3.125 mg total) by mouth 2 (two) times daily with a meal.     feeding  supplement (GLUCERNA SHAKE) Liqd  Take 237 mLs by mouth 3 (three) times daily between meals.     insulin glargine 100 UNIT/ML injection  Commonly known as:  LANTUS  Inject 0.1 mLs (10 Units total) into the skin daily.     ipratropium-albuterol 0.5-2.5 (3) MG/3ML Soln  Commonly known as:  DUONEB  Take 3 mLs by nebulization 3 (three) times daily.     levofloxacin 750 MG tablet  Commonly known as:  LEVAQUIN  Take 1 tablet (750 mg total) by mouth every other day.     morphine CONCENTRATE 10 mg / 0.5 ml concentrated solution  Take 0.25-0.5 mLs (5-10 mg total) by mouth every hour as needed for moderate pain, severe pain, anxiety or shortness of breath.     omeprazole 40 MG capsule  Commonly known as:  PRILOSEC  Take 1 capsule (40 mg total) by mouth 2 (two) times daily.     ondansetron 8 MG disintegrating tablet  Commonly known as:  ZOFRAN-ODT  Take 1 tablet (8 mg total) by mouth every 12 (twelve) hours.     predniSONE 20 MG tablet  Commonly known as:  DELTASONE  Take 2 tablets (40 mg total) by mouth daily with breakfast.     senna-docusate 8.6-50 MG per tablet  Commonly known as:  Senokot-S  Take 1 tablet by mouth at bedtime.     sodium chloride 0.9 % injection  Inject 3 mLs into the vein as needed.     tamsulosin 0.4 MG Caps capsule  Commonly known as:  FLOMAX  Take 1 capsule (0.4 mg total) by mouth at bedtime.        Disposition: Home with Hospice and  Palliative Care   Discharged Condition: Trevor Morris has met maximum benefit of inpatient care and is medically stable and cleared for discharge.  Patient is pending follow up as above.      Time spent on disposition:  Greater than 35 minutes.   Signed: Noe Gens, NP-C [facility] Pgr: [phone] Office: [phone]

## 2014-04-23 NOTE — Progress Notes (Addendum)
Mr. Bober is declining this AM. Equipment is at home and hospice RN will meet him at his home upon discharge. He is still having issues with regurgitation. No desire for food this AM-he is drinking fluids. He is not making a lot of urine may be over diuresis he is also more hypotensive so I will give him a small fluid bolus to try to get him home. He wants to die at home so we are trying to honor that wish. Will need ambulance transport ASAP. Ok for him to go on 100% NRB home- concentrator goes to [employer]- hospice RN aware. I did discharge med rec to help with discharge process.   Anderson Malta, DO Palliative Medicine

## 2014-04-23 DEATH — deceased

## 2014-04-30 ENCOUNTER — Encounter: Payer: Medicare Other | Admitting: Vascular Surgery

## 2014-04-30 ENCOUNTER — Other Ambulatory Visit (HOSPITAL_COMMUNITY): Payer: Medicare Other

## 2014-05-28 ENCOUNTER — Ambulatory Visit: Payer: Medicare Other | Admitting: Vascular Surgery

## 2014-05-28 ENCOUNTER — Encounter (HOSPITAL_COMMUNITY): Payer: Medicare Other

## 2014-05-28 ENCOUNTER — Other Ambulatory Visit (HOSPITAL_COMMUNITY): Payer: Medicare Other

## 2014-07-23 NOTE — Telephone Encounter (Signed)
Patient status changed to deceased in [facility]. Trevor Morris °

## 2015-02-13 IMAGING — CT CT HEAD W/O CM
1 series · 15 of 30 positions shown, 19 images · non-contrast
Comparison: None.

CLINICAL DATA: Found on floor.  Assess for head injury.

EXAM:
CT HEAD WITHOUT CONTRAST
TECHNIQUE: Contiguous axial images were obtained from the base of the skull
through the vertex without intravenous contrast.

[Series 2: head 5.0 h30s · axial · 0.46mm/px · z∈[-93,+42]mm · 15 of 30 slices shown, 19 images]
[im 2/30  brain]
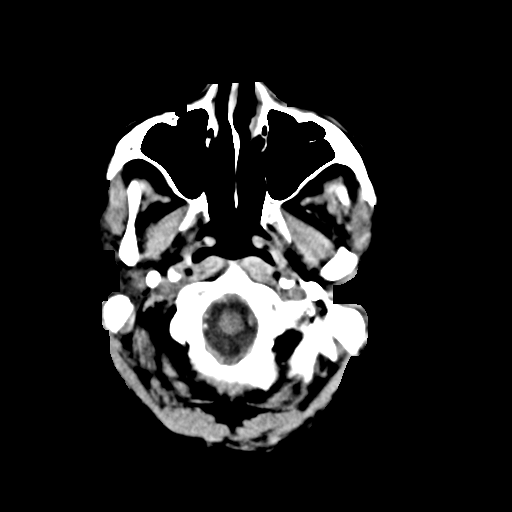
[im 2/30  bone]
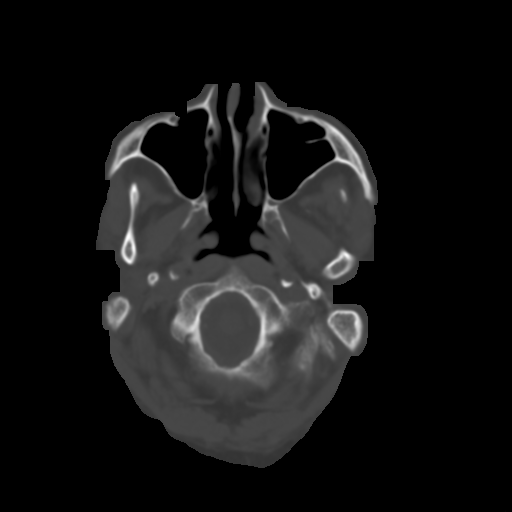
[im 4/30  brain]
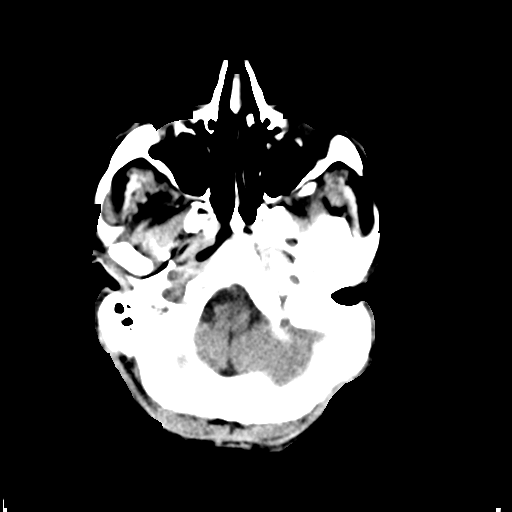
[im 6/30  brain]
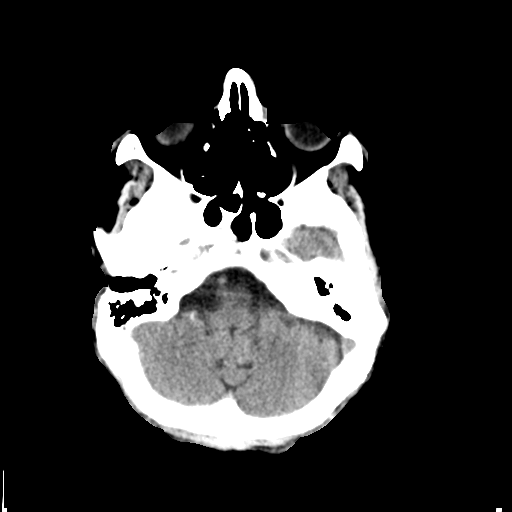
[im 8/30  brain]
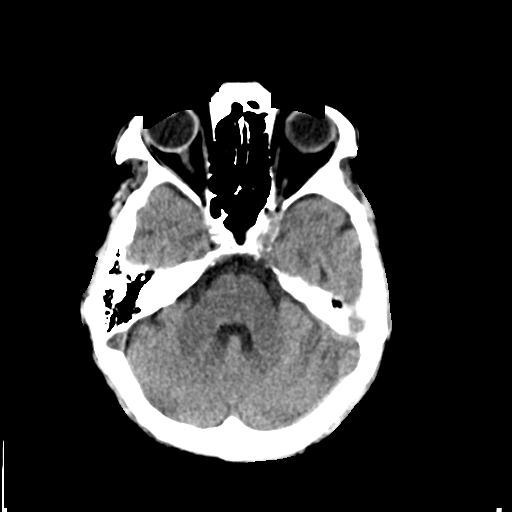
[im 10/30  brain]
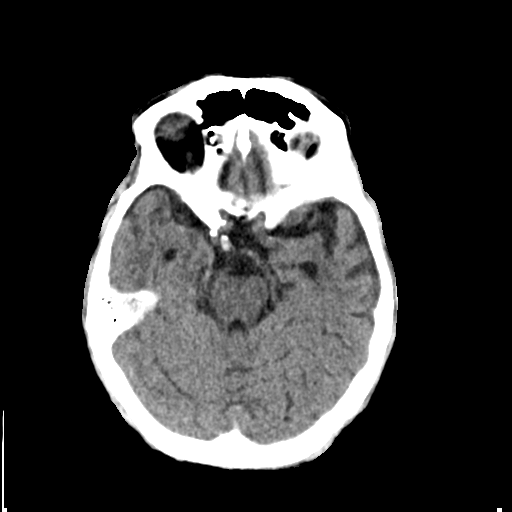
[im 10/30  bone]
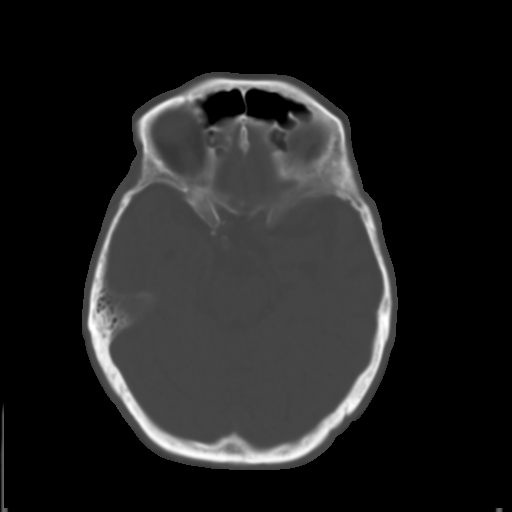
[im 12/30  brain]
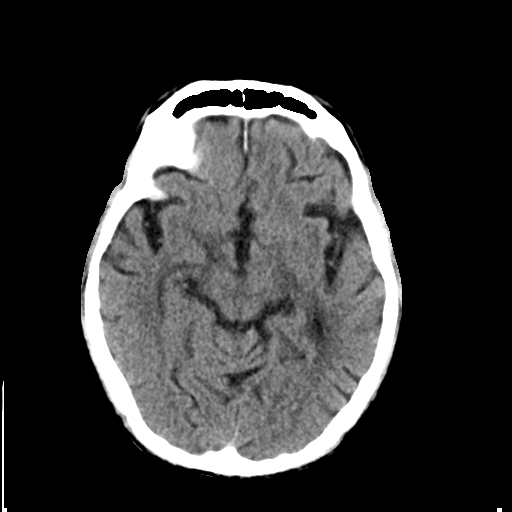
[im 14/30  brain]
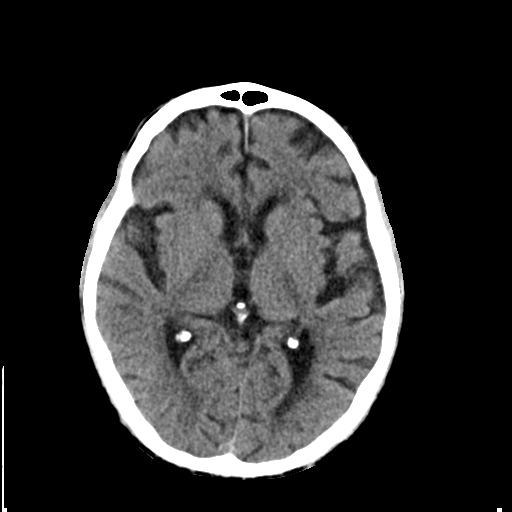
[im 16/30  brain]
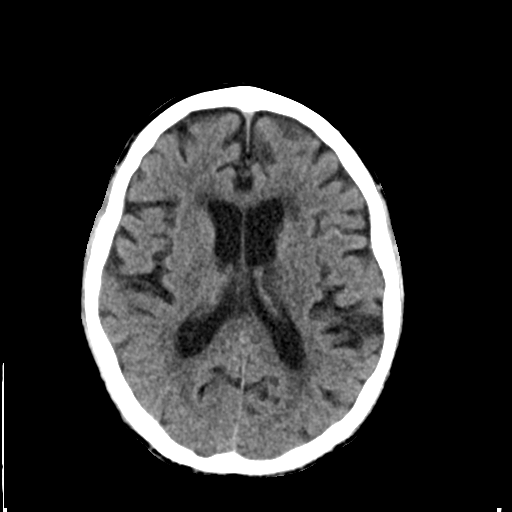
[im 17/30  brain]
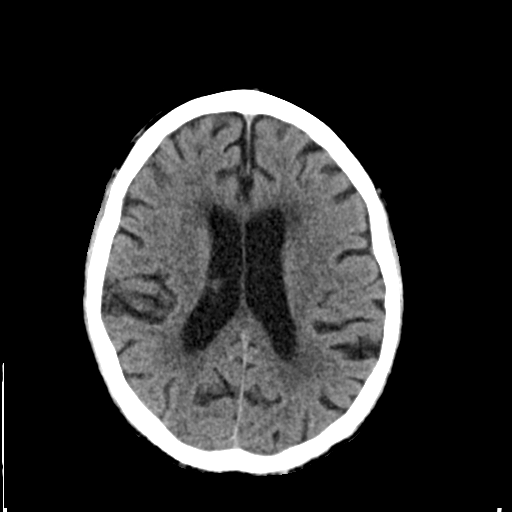
[im 17/30  bone]
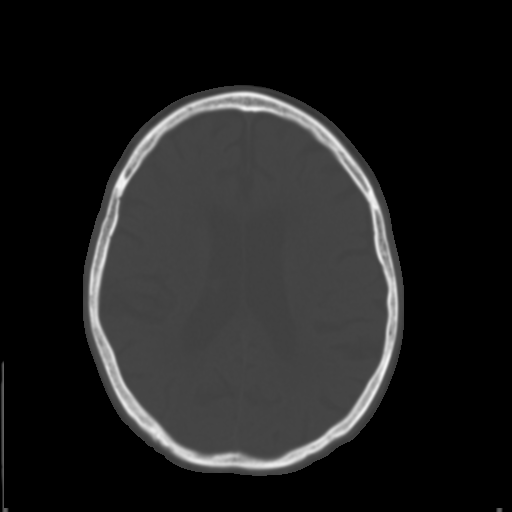
[im 19/30  brain]
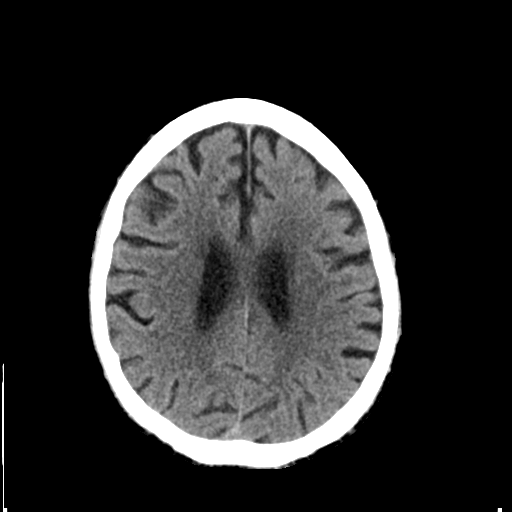
[im 21/30  brain]
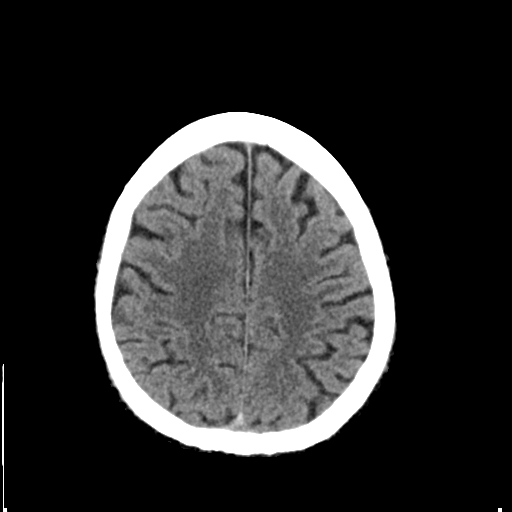
[im 23/30  brain]
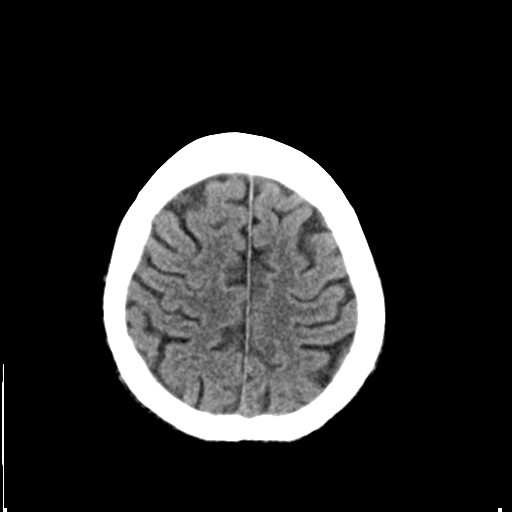
[im 25/30  brain]
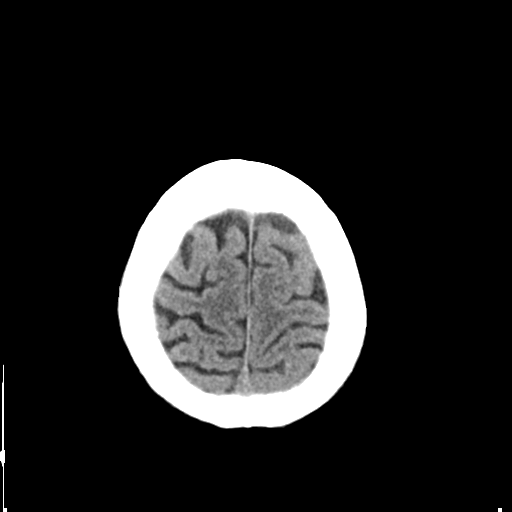
[im 25/30  bone]
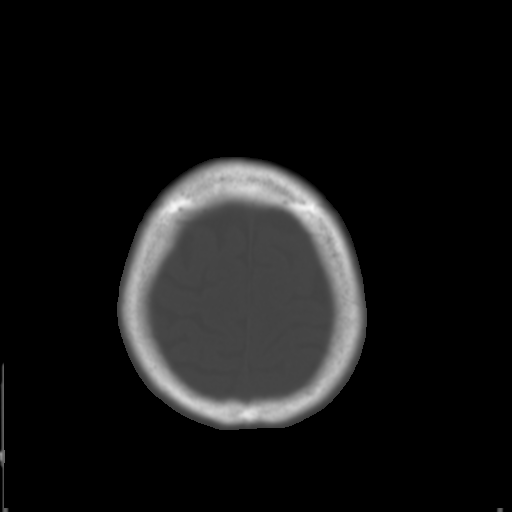
[im 27/30  brain]
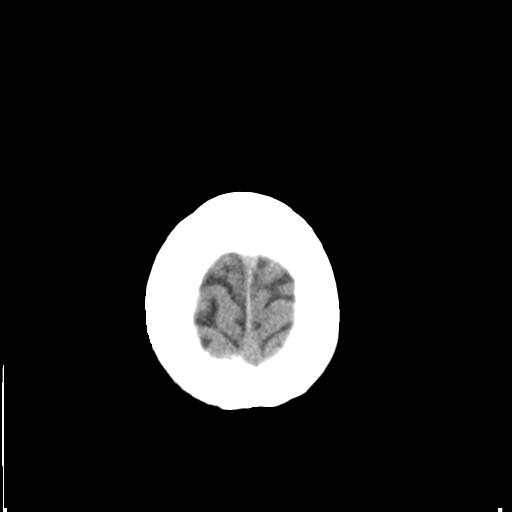
[im 29/30  brain]
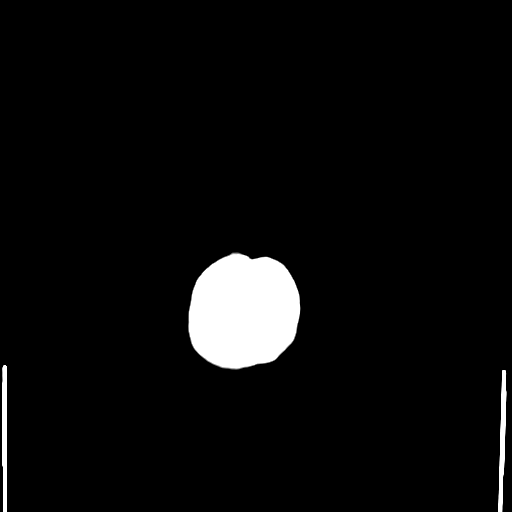

[15 of 30 positions shown; findings below may reference images not displayed]

FINDINGS: There is no evidence of acute infarction, mass lesion, or intra- or
extra-axial hemorrhage on CT.

Prominence of the ventricles and sulci reflects mild to moderate
cortical volume loss. Scattered periventricular white matter change
likely reflects small vessel ischemic microangiopathy.

The brainstem and fourth ventricle are within normal limits. The
basal ganglia are unremarkable in appearance. The cerebral
hemispheres demonstrate grossly normal gray-white differentiation.
No mass effect or midline shift is seen.

There is no evidence of fracture; visualized osseous structures are
unremarkable in appearance. The orbits are within normal limits.
There is near-complete opacification of the left mastoid air cells.
The paranasal sinuses and right mastoid air cells are well-aerated.
No significant soft tissue abnormalities are seen.
IMPRESSION: 1. No evidence of traumatic intracranial injury or fracture.
2. Mild to moderate cortical volume loss and scattered small vessel
ischemic microangiopathy.
3. Near complete opacification of the left mastoid air cells.

## 2015-02-14 IMAGING — CR DG CHEST 1V PORT
1 series · 1 of 1 positions shown · non-contrast
Comparison: 04/03/2014

CLINICAL DATA: Hypoxemia

EXAM:
PORTABLE CHEST - 1 VIEW

[AP]
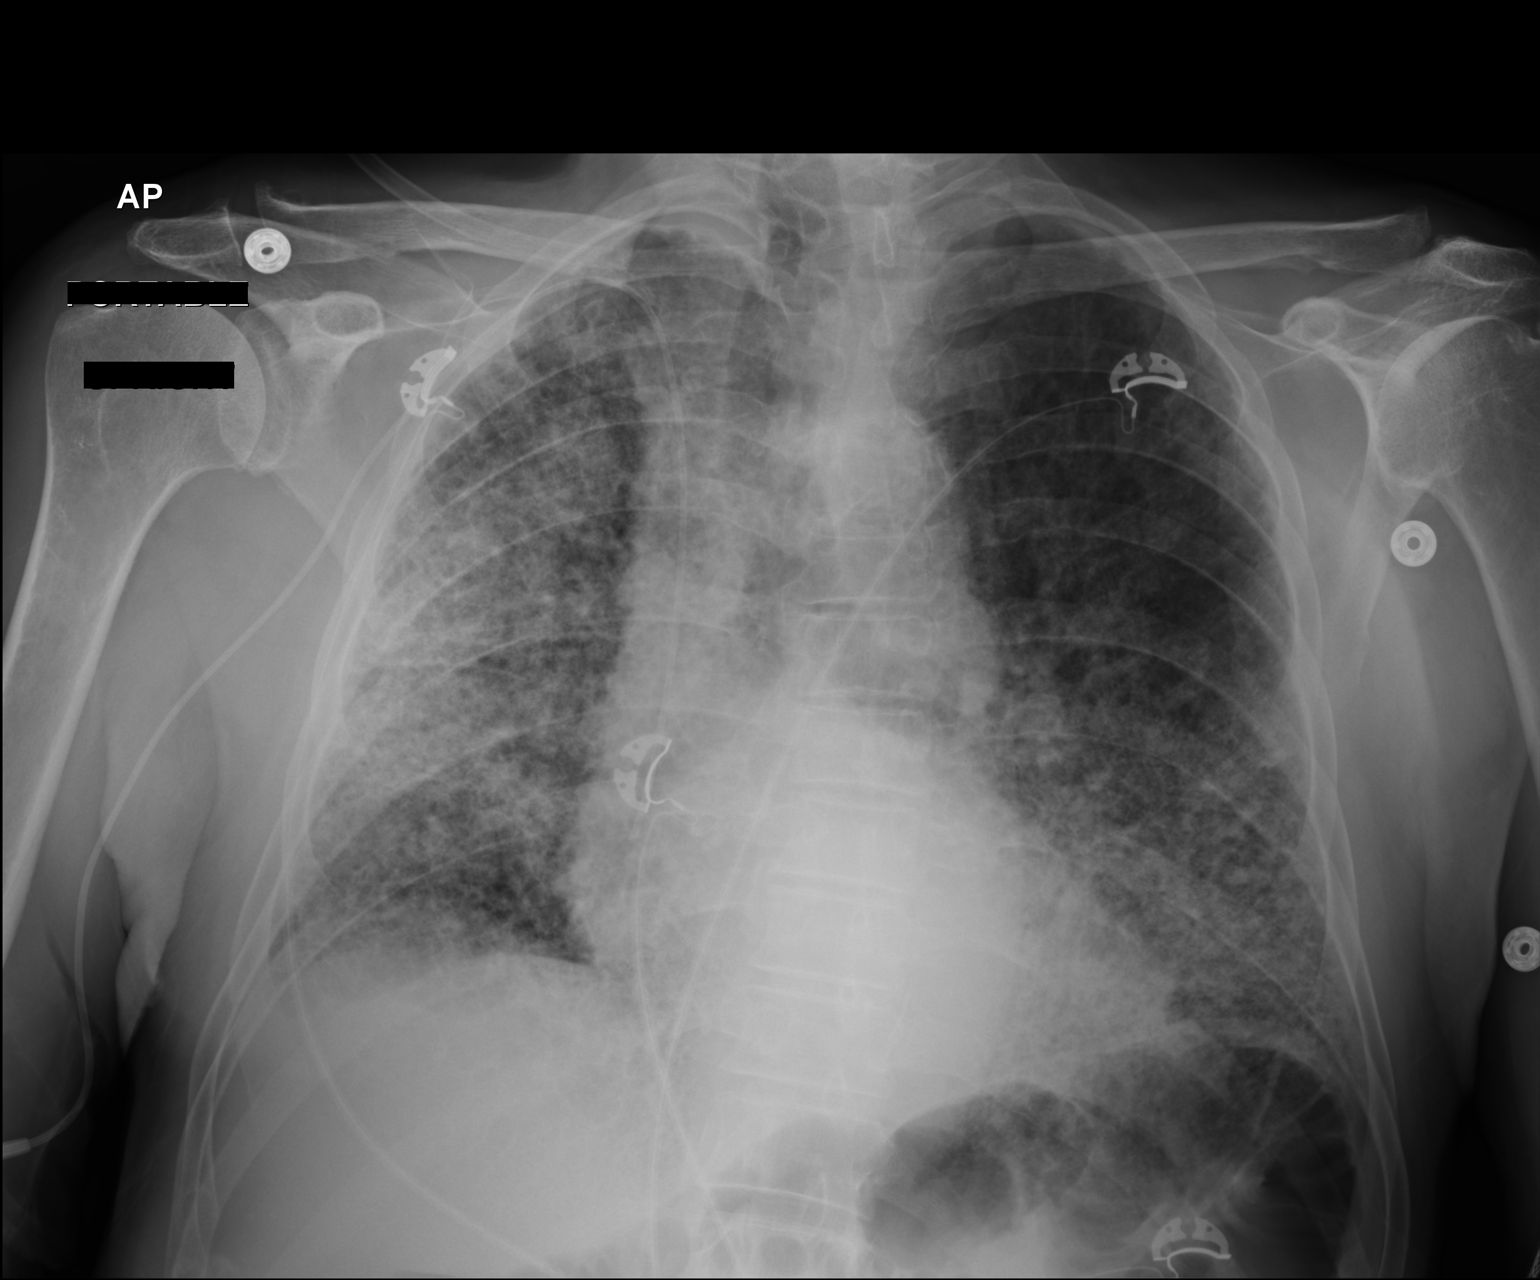

[1 of 1 positions shown; findings below may reference images not displayed]

FINDINGS: Right-sided PICC line with the tip projecting over the cavoatrial
junction. There is patchy interstitial and alveolar right upper
lobe, right lower lobe and left lower lobe airspace disease
concerning for multilobar pneumonia versus ARDS. There is a trace
right pleural effusion. There is no pneumothorax. Stable
cardiomediastinal silhouette.
IMPRESSION: There is patchy interstitial and alveolar right upper lobe, right
lower lobe and left lower lobe airspace disease concerning for
multilobar pneumonia versus ARDS versus atypical pulmonary edema.
